# Patient Record
Sex: Female | Born: 1960 | Race: Black or African American | Hispanic: No | Smoking: Never smoker
Health system: Southern US, Community
[De-identification: ages and names within clinical notes are randomized; demographics above are authoritative.]

## PROBLEM LIST (undated history)

## (undated) DIAGNOSIS — I2699 Other pulmonary embolism without acute cor pulmonale: Secondary | ICD-10-CM

## (undated) DIAGNOSIS — Z800 Family history of malignant neoplasm of digestive organs: Secondary | ICD-10-CM

## (undated) DIAGNOSIS — R519 Headache, unspecified: Secondary | ICD-10-CM

## (undated) DIAGNOSIS — I4510 Unspecified right bundle-branch block: Secondary | ICD-10-CM

## (undated) DIAGNOSIS — E669 Obesity, unspecified: Secondary | ICD-10-CM

## (undated) DIAGNOSIS — I200 Unstable angina: Secondary | ICD-10-CM

## (undated) DIAGNOSIS — C50912 Malignant neoplasm of unspecified site of left female breast: Secondary | ICD-10-CM

## (undated) DIAGNOSIS — M360 Dermato(poly)myositis in neoplastic disease: Secondary | ICD-10-CM

## (undated) DIAGNOSIS — Z9221 Personal history of antineoplastic chemotherapy: Secondary | ICD-10-CM

## (undated) DIAGNOSIS — M1990 Unspecified osteoarthritis, unspecified site: Secondary | ICD-10-CM

## (undated) DIAGNOSIS — IMO0001 Reserved for inherently not codable concepts without codable children: Secondary | ICD-10-CM

## (undated) DIAGNOSIS — R51 Headache: Secondary | ICD-10-CM

## (undated) DIAGNOSIS — I341 Nonrheumatic mitral (valve) prolapse: Secondary | ICD-10-CM

## (undated) DIAGNOSIS — Z803 Family history of malignant neoplasm of breast: Secondary | ICD-10-CM

## (undated) DIAGNOSIS — M3390 Dermatopolymyositis, unspecified, organ involvement unspecified: Secondary | ICD-10-CM

## (undated) DIAGNOSIS — R011 Cardiac murmur, unspecified: Secondary | ICD-10-CM

## (undated) DIAGNOSIS — M3313 Other dermatomyositis without myopathy: Secondary | ICD-10-CM

## (undated) DIAGNOSIS — J189 Pneumonia, unspecified organism: Secondary | ICD-10-CM

## (undated) DIAGNOSIS — K760 Fatty (change of) liver, not elsewhere classified: Secondary | ICD-10-CM

## (undated) DIAGNOSIS — I10 Essential (primary) hypertension: Secondary | ICD-10-CM

## (undated) DIAGNOSIS — C55 Malignant neoplasm of uterus, part unspecified: Secondary | ICD-10-CM

## (undated) DIAGNOSIS — Z808 Family history of malignant neoplasm of other organs or systems: Secondary | ICD-10-CM

## (undated) DIAGNOSIS — Z0389 Encounter for observation for other suspected diseases and conditions ruled out: Secondary | ICD-10-CM

## (undated) DIAGNOSIS — Z8489 Family history of other specified conditions: Secondary | ICD-10-CM

## (undated) DIAGNOSIS — D126 Benign neoplasm of colon, unspecified: Secondary | ICD-10-CM

## (undated) HISTORY — PX: LAPAROSCOPIC TOTAL HYSTERECTOMY: SUR800

## (undated) HISTORY — DX: Cardiac murmur, unspecified: R01.1

## (undated) HISTORY — DX: Family history of malignant neoplasm of digestive organs: Z80.0

## (undated) HISTORY — DX: Benign neoplasm of colon, unspecified: D12.6

## (undated) HISTORY — DX: Fatty (change of) liver, not elsewhere classified: K76.0

## (undated) HISTORY — DX: Family history of malignant neoplasm of other organs or systems: Z80.8

## (undated) HISTORY — DX: Reserved for inherently not codable concepts without codable children: IMO0001

## (undated) HISTORY — PX: REDUCTION MAMMAPLASTY: SUR839

## (undated) HISTORY — DX: Dermato(poly)myositis in neoplastic disease: M36.0

## (undated) HISTORY — DX: Unspecified right bundle-branch block: I45.10

## (undated) HISTORY — DX: Encounter for observation for other suspected diseases and conditions ruled out: Z03.89

## (undated) HISTORY — DX: Family history of malignant neoplasm of breast: Z80.3

---

## 1984-05-07 HISTORY — PX: INGUINAL HERNIA REPAIR: SUR1180

## 1988-05-07 HISTORY — PX: TUBAL LIGATION: SHX77

## 1997-05-07 DIAGNOSIS — C50912 Malignant neoplasm of unspecified site of left female breast: Secondary | ICD-10-CM

## 1997-05-07 HISTORY — PX: MASTECTOMY: SHX3

## 1997-05-07 HISTORY — PX: BREAST BIOPSY: SHX20

## 1997-05-07 HISTORY — DX: Malignant neoplasm of unspecified site of left female breast: C50.912

## 2003-05-08 HISTORY — PX: RECONSTRUCTION BREAST IMMEDIATE / DELAYED W/ TISSUE EXPANDER: SUR1077

## 2005-05-07 DIAGNOSIS — C55 Malignant neoplasm of uterus, part unspecified: Secondary | ICD-10-CM

## 2005-05-07 HISTORY — PX: TOTAL ABDOMINAL HYSTERECTOMY: SHX209

## 2005-05-07 HISTORY — DX: Malignant neoplasm of uterus, part unspecified: C55

## 2010-04-06 ENCOUNTER — Emergency Department (HOSPITAL_COMMUNITY)
Admission: EM | Admit: 2010-04-06 | Discharge: 2010-04-06 | Payer: Self-pay | Source: Home / Self Care | Admitting: Emergency Medicine

## 2010-04-06 ENCOUNTER — Emergency Department (HOSPITAL_COMMUNITY)
Admission: EM | Admit: 2010-04-06 | Discharge: 2010-04-06 | Disposition: A | Discharge status: Other Acute Inpatient Hospital | Payer: Self-pay | Source: Home / Self Care | Admitting: Family Medicine

## 2010-05-10 ENCOUNTER — Emergency Department (HOSPITAL_COMMUNITY)
Admission: EM | Admit: 2010-05-10 | Discharge: 2010-05-10 | Payer: Self-pay | Source: Home / Self Care | Admitting: Family Medicine

## 2010-06-01 ENCOUNTER — Ambulatory Visit (HOSPITAL_COMMUNITY)
Admission: RE | Admit: 2010-06-01 | Discharge: 2010-06-01 | Payer: Self-pay | Source: Home / Self Care | Attending: Family Medicine | Admitting: Family Medicine

## 2010-07-18 LAB — DIFFERENTIAL
Lymphocytes Relative: 39 % (ref 12–46)
Monocytes Absolute: 0.5 10*3/uL (ref 0.1–1.0)
Monocytes Relative: 8 % (ref 3–12)
Neutro Abs: 3.4 10*3/uL (ref 1.7–7.7)
Neutrophils Relative %: 51 % (ref 43–77)

## 2010-07-18 LAB — POCT CARDIAC MARKERS
CKMB, poc: <1 ng/mL — ABNORMAL LOW (ref 1.0–8.0)
Myoglobin, poc: 80.6 ng/mL (ref 12–200)
Myoglobin, poc: 81.6 ng/mL (ref 12–200)
Troponin i, poc: <0.05 ng/mL (ref 0.00–0.09)

## 2010-07-18 LAB — CBC
MCH: 29 pg (ref 26.0–34.0)
MCHC: 32.6 g/dL (ref 30.0–36.0)
Platelets: 260 10*3/uL (ref 150–400)
RBC: 4.73 MIL/uL (ref 3.87–5.11)
RDW: 13.3 % (ref 11.5–15.5)

## 2010-07-18 LAB — COMPREHENSIVE METABOLIC PANEL
Albumin: 3.6 g/dL (ref 3.5–5.2)
BUN: 12 mg/dL (ref 6–23)
Calcium: 9.8 mg/dL (ref 8.4–10.5)
Creatinine, Ser: 0.7 mg/dL (ref 0.4–1.2)
Total Protein: 7.9 g/dL (ref 6.0–8.3)

## 2010-07-18 LAB — CK: Total CK: 75 U/L (ref 7–177)

## 2010-11-14 ENCOUNTER — Inpatient Hospital Stay (INDEPENDENT_AMBULATORY_CARE_PROVIDER_SITE_OTHER)
Admission: RE | Admit: 2010-11-14 | Discharge: 2010-11-14 | Disposition: A | Discharge status: Home / Self Care | Payer: Self-pay | Source: Ambulatory Visit | Attending: Emergency Medicine | Admitting: Emergency Medicine

## 2010-11-14 ENCOUNTER — Ambulatory Visit (INDEPENDENT_AMBULATORY_CARE_PROVIDER_SITE_OTHER): Payer: Self-pay

## 2010-11-14 DIAGNOSIS — R05 Cough: Secondary | ICD-10-CM

## 2010-11-14 DIAGNOSIS — R059 Cough, unspecified: Secondary | ICD-10-CM

## 2010-11-14 DIAGNOSIS — J40 Bronchitis, not specified as acute or chronic: Secondary | ICD-10-CM

## 2011-04-26 ENCOUNTER — Emergency Department (HOSPITAL_COMMUNITY)
Admission: EM | Admit: 2011-04-26 | Discharge: 2011-04-26 | Disposition: A | Discharge status: Home / Self Care | Payer: Self-pay | Source: Home / Self Care | Attending: Family Medicine | Admitting: Family Medicine

## 2011-04-26 DIAGNOSIS — J019 Acute sinusitis, unspecified: Secondary | ICD-10-CM

## 2011-04-26 MED ORDER — AMOXICILLIN-POT CLAVULANATE 875-125 MG PO TABS: 1.0000 | ORAL_TABLET | Freq: Two times a day (BID) | ORAL | Status: AC

## 2011-04-26 MED ORDER — HYDROCODONE-ACETAMINOPHEN 5-325 MG PO TABS: 2.0000 | ORAL_TABLET | ORAL | Status: AC | PRN

## 2011-04-26 NOTE — ED Notes (Signed)
PT HERE WITH SINUS INFECTION/URI SX THAT STARTED X 2 DYS AGO WITH RIGHT EYE H/A,MIN COUGH AND NASAL CONGESTION BUT HAS WORSENED WITH SEVERE THROB PRESSURE IN HEAD AND FACIAL PAIN

## 2011-04-26 NOTE — ED Provider Notes (Signed)
History     CSN: [id number]  Arrival date & time 04/26/11  1523   First MD Initiated Contact with Patient 04/26/11 1616      Chief Complaint  Patient presents with  . Sinusitis  . URI    (Consider location/radiation/quality/duration/timing/severity/associated sxs/prior treatment) Patient is a 50 y.o. female presenting with sinusitis and URI. The history is provided by the patient. No language interpreter was used.  Sinusitis  This is a new problem. The current episode started more than 2 days ago. The problem has been gradually worsening. The maximum temperature recorded prior to her arrival was 100 to 100.9 F. The fever has been present for less than 1 day. The pain is at a severity of 7/10. The pain is moderate. The pain has been constant since onset. Associated symptoms include sinus pressure, sore throat and cough. She has tried nothing for the symptoms. The treatment provided no relief.  URI The primary symptoms include sore throat and cough.  Symptoms associated with the illness include sinus pressure.  Pt complains of green sinus drainage.  No past medical history on file.  No past surgical history on file.  No family history on file.  History  Substance Use Topics  . Smoking status: Not on file  . Smokeless tobacco: Not on file  . Alcohol Use: Not on file    OB History    No data available      Review of Systems  HENT: Positive for sore throat and sinus pressure.   Respiratory: Positive for cough.   All other systems reviewed and are negative.    Allergies  Review of patient's allergies indicates no known allergies.  Home Medications   Current Outpatient Rx  Name Route Sig Dispense Refill  . LISINOPRIL 10 MG PO TABS Oral Take 10 mg by mouth daily.      Marland Kitchen PREDNISONE 20 MG PO TABS Oral Take 20 mg by mouth daily.      Marland Kitchen VERAPAMIL HCL 120 MG PO TABS Oral Take by mouth 2 (two) times daily.        BP 127/84  Pulse 72  Temp(Src) 97.8 F (36.6 C)  (Oral)  Resp 18  SpO2 100%  Physical Exam  Nursing note and vitals reviewed. Constitutional: She is oriented to person, place, and time. She appears well-developed and well-nourished.  HENT:  Head: Normocephalic and atraumatic.  Right Ear: External ear normal.  Left Ear: External ear normal.  Nose: Nose normal.  Mouth/Throat: Oropharynx is clear and moist.       bilat maxillary sinus tenderness  Eyes: Conjunctivae and EOM are normal. Pupils are equal, round, and reactive to light.  Neck: Normal range of motion. Neck supple.  Cardiovascular: Normal rate.   Pulmonary/Chest: Effort normal.  Abdominal: Soft.  Neurological: She is alert and oriented to person, place, and time.  Skin: Skin is warm and dry.  Psychiatric: She has a normal mood and affect.    ED Course  Procedures (including critical care time)  Labs Reviewed - No data to display No results found.   No diagnosis found.    MDM          Langston Masker, PA 04/26/11 [phone]

## 2011-04-26 NOTE — ED Provider Notes (Signed)
Medical screening examination/treatment/procedure(s) were performed by non-physician practitioner and as supervising physician I was immediately available for consultation/collaboration.   Abcde Oneil DOUGLAS MD.    Arley Salamone Douglas Deneene Tarver, MD 04/26/11 2011 

## 2011-07-12 ENCOUNTER — Encounter (HOSPITAL_COMMUNITY): Payer: Self-pay

## 2011-07-12 ENCOUNTER — Other Ambulatory Visit: Payer: Self-pay

## 2011-07-12 ENCOUNTER — Observation Stay (HOSPITAL_COMMUNITY)
Admission: EM | Admit: 2011-07-12 | Discharge: 2011-07-13 | Disposition: A | Discharge status: Home / Self Care | Payer: 59 | Attending: Family Medicine | Admitting: Family Medicine

## 2011-07-12 ENCOUNTER — Emergency Department (HOSPITAL_COMMUNITY): Payer: Self-pay

## 2011-07-12 DIAGNOSIS — R0789 Other chest pain: Secondary | ICD-10-CM | POA: Insufficient documentation

## 2011-07-12 DIAGNOSIS — R079 Chest pain, unspecified: Principal | ICD-10-CM | POA: Insufficient documentation

## 2011-07-12 DIAGNOSIS — I10 Essential (primary) hypertension: Secondary | ICD-10-CM | POA: Diagnosis present

## 2011-07-12 DIAGNOSIS — E669 Obesity, unspecified: Secondary | ICD-10-CM | POA: Insufficient documentation

## 2011-07-12 DIAGNOSIS — I200 Unstable angina: Secondary | ICD-10-CM

## 2011-07-12 DIAGNOSIS — Z853 Personal history of malignant neoplasm of breast: Secondary | ICD-10-CM | POA: Insufficient documentation

## 2011-07-12 DIAGNOSIS — M79609 Pain in unspecified limb: Secondary | ICD-10-CM | POA: Insufficient documentation

## 2011-07-12 HISTORY — DX: Obesity, unspecified: E66.9

## 2011-07-12 HISTORY — DX: Essential (primary) hypertension: I10

## 2011-07-12 HISTORY — DX: Unstable angina: I20.0

## 2011-07-12 HISTORY — DX: Dermatopolymyositis, unspecified, organ involvement unspecified: M33.90

## 2011-07-12 HISTORY — DX: Malignant neoplasm of unspecified site of left female breast: C50.912

## 2011-07-12 HISTORY — DX: Other dermatomyositis without myopathy: M33.13

## 2011-07-12 LAB — DIFFERENTIAL
Eosinophils Absolute: 0.1 10*3/uL (ref 0.0–0.7)
Eosinophils Relative: 1 % (ref 0–5)
Lymphocytes Relative: 26 % (ref 12–46)
Lymphs Abs: 1.5 10*3/uL (ref 0.7–4.0)
Monocytes Absolute: 0.4 10*3/uL (ref 0.1–1.0)

## 2011-07-12 LAB — CBC
HCT: 39.4 % (ref 36.0–46.0)
MCH: 28 pg (ref 26.0–34.0)
MCV: 89.5 fL (ref 78.0–100.0)
Platelets: 234 10*3/uL (ref 150–400)
RBC: 4.4 MIL/uL (ref 3.87–5.11)
RDW: 13.8 % (ref 11.5–15.5)

## 2011-07-12 LAB — BASIC METABOLIC PANEL
CO2: 27 mEq/L (ref 19–32)
Calcium: 9.5 mg/dL (ref 8.4–10.5)
Creatinine, Ser: 0.84 mg/dL (ref 0.50–1.10)
GFR calc non Af Amer: 79 mL/min — ABNORMAL LOW (ref >90)
Glucose, Bld: 117 mg/dL — ABNORMAL HIGH (ref 70–99)
Sodium: 135 mEq/L (ref 135–145)

## 2011-07-12 LAB — CARDIAC PANEL(CRET KIN+CKTOT+MB+TROPI): CK, MB: 1.6 ng/mL (ref 0.3–4.0)

## 2011-07-12 MED ORDER — ONDANSETRON HCL 4 MG/2ML IJ SOLN: 4.0000 mg | Freq: Four times a day (QID) | INTRAMUSCULAR | Status: DC | PRN

## 2011-07-12 MED ORDER — ASPIRIN 81 MG PO CHEW
324.0000 mg | CHEWABLE_TABLET | Freq: Once | ORAL | Status: AC
  Administered 2011-07-12: 324 mg via ORAL

## 2011-07-12 MED ORDER — LISINOPRIL 20 MG PO TABS
20.0000 mg | ORAL_TABLET | Freq: Every day | ORAL | Status: DC
  Administered 2011-07-13: 20 mg via ORAL
  Filled 2011-07-12: qty 1

## 2011-07-12 MED ORDER — ACETAMINOPHEN 650 MG RE SUPP: 650.0000 mg | Freq: Four times a day (QID) | RECTAL | Status: DC | PRN

## 2011-07-12 MED ORDER — SODIUM CHLORIDE 0.9 % IV SOLN
INTRAVENOUS | Status: DC
  Administered 2011-07-12: 1000 mL via INTRAVENOUS

## 2011-07-12 MED ORDER — PREDNISOLONE 5 MG PO TABS
5.0000 mg | ORAL_TABLET | Freq: Every day | ORAL | Status: DC
  Administered 2011-07-13: 5 mg via ORAL
  Filled 2011-07-12 (×2): qty 1

## 2011-07-12 MED ORDER — PANTOPRAZOLE SODIUM 40 MG PO TBEC
40.0000 mg | DELAYED_RELEASE_TABLET | Freq: Every day | ORAL | Status: DC
  Administered 2011-07-13 (×2): 40 mg via ORAL
  Filled 2011-07-12 (×2): qty 1

## 2011-07-12 MED ORDER — ACETAMINOPHEN 325 MG PO TABS
650.0000 mg | ORAL_TABLET | Freq: Once | ORAL | Status: AC
  Administered 2011-07-12: 650 mg via ORAL
  Filled 2011-07-12: qty 1

## 2011-07-12 MED ORDER — HYDROCHLOROTHIAZIDE 25 MG PO TABS
25.0000 mg | ORAL_TABLET | Freq: Every day | ORAL | Status: DC
  Administered 2011-07-13: 25 mg via ORAL
  Filled 2011-07-12: qty 1

## 2011-07-12 MED ORDER — ASPIRIN EC 325 MG PO TBEC
325.0000 mg | DELAYED_RELEASE_TABLET | Freq: Every day | ORAL | Status: DC
  Administered 2011-07-13: 325 mg via ORAL
  Filled 2011-07-12: qty 1

## 2011-07-12 MED ORDER — NITROGLYCERIN 0.4 MG SL SUBL
SUBLINGUAL_TABLET | SUBLINGUAL | Status: AC
  Administered 2011-07-13: 0.4 mg via SUBLINGUAL
  Filled 2011-07-12: qty 25

## 2011-07-12 MED ORDER — SODIUM CHLORIDE 0.9 % IJ SOLN
3.0000 mL | Freq: Two times a day (BID) | INTRAMUSCULAR | Status: DC
  Administered 2011-07-13: 3 mL via INTRAVENOUS

## 2011-07-12 MED ORDER — VERAPAMIL HCL 120 MG PO TABS
120.0000 mg | ORAL_TABLET | Freq: Two times a day (BID) | ORAL | Status: DC
  Administered 2011-07-13 (×2): 120 mg via ORAL
  Filled 2011-07-12 (×4): qty 1

## 2011-07-12 MED ORDER — LISINOPRIL-HYDROCHLOROTHIAZIDE 20-25 MG PO TABS: 1.0000 | ORAL_TABLET | Freq: Every morning | ORAL | Status: DC

## 2011-07-12 MED ORDER — ONDANSETRON HCL 4 MG PO TABS: 4.0000 mg | ORAL_TABLET | Freq: Four times a day (QID) | ORAL | Status: DC | PRN

## 2011-07-12 MED ORDER — NITROGLYCERIN 0.4 MG/HR TD PT24
0.4000 mg | MEDICATED_PATCH | Freq: Every day | TRANSDERMAL | Status: DC
  Administered 2011-07-13: 0.4 mg via TRANSDERMAL
  Filled 2011-07-12: qty 1

## 2011-07-12 MED ORDER — NITROGLYCERIN 0.4 MG SL SUBL
0.4000 mg | SUBLINGUAL_TABLET | SUBLINGUAL | Status: AC | PRN
  Administered 2011-07-12 (×3): 0.4 mg via SUBLINGUAL

## 2011-07-12 MED ORDER — ACETAMINOPHEN 325 MG PO TABS: 650.0000 mg | ORAL_TABLET | Freq: Four times a day (QID) | ORAL | Status: DC | PRN

## 2011-07-12 MED ORDER — ENOXAPARIN SODIUM 40 MG/0.4ML ~~LOC~~ SOLN
40.0000 mg | SUBCUTANEOUS | Status: DC
  Administered 2011-07-13: 40 mg via SUBCUTANEOUS
  Filled 2011-07-12 (×2): qty 0.4

## 2011-07-12 NOTE — ED Notes (Signed)
Pt c/o chest pain again. 6/10

## 2011-07-12 NOTE — ED Notes (Signed)
Pt has bed ready. Awaiting orders to admit pt

## 2011-07-12 NOTE — ED Notes (Signed)
Pt states that she was in the shower today around 1pm and states that she had onset of left sided chest pain that had some radiation down her left arm. She denies any other s/s. Pt is alert and oriented. She states that she has an appointment on 07-20-11 with her pcp at [facility]. She states that she has been having issues with regulating her blood pressure. Pt states that initially she felt like it was heartburn that she just couldn't get to improve.

## 2011-07-12 NOTE — ED Notes (Signed)
2009-01 Ready 

## 2011-07-12 NOTE — H&P (Signed)
Gail Schmitt is an 51 y.o. female.   PCP - [facility]. Chief Complaint: Chest pain. HPI: 51 year-old female with history of hypertension, dermatomyositis, history of breast cancer status post mastectomy and chemotherapy in 1998 presented to the ER because of chest pain. Patient has been experiencing chest pain off and on for last 2 weeks with some exertional shortness of breath. The chest pain is usually left-sided anterior chest wall. It lasts a few minutes usually. But today the pain was more persistent radiating to her left arm. The chest pain as like pressure like with no associated diaphoresis nausea or palpitations or dizziness. She decided to call EMS and was brought to the ER. Her chest pain got better after she got nitroglycerin sublingual. Presently patient is chest pain-free. Patient denies any headache focal deficits abdominal pain or diarrhea.  Past Medical History  Diagnosis Date  . Hypertension   . Cancer     Past Surgical History  Procedure Date  . Laparoscopic total hysterectomy   . Mastectomy     left side, 10 years ago  . Breast surgery     Family History  Problem Relation Age of Onset  . Diabetes type II Mother    Social History:  reports that she has never smoked. She does not have any smokeless tobacco history on file. She reports that she does not drink alcohol or use illicit drugs.  Allergies:  Allergies  Allergen Reactions  . Percocet (Oxycodone-Acetaminophen) Itching and Rash    Medications Prior to Admission  Medication Dose Route Frequency Provider Last Rate Last Dose  . acetaminophen (TYLENOL) tablet 650 mg  650 mg Oral Once Felisa Bonier, MD   650 mg at 07/12/11 1939  . aspirin chewable tablet 324 mg  324 mg Oral Once Felisa Bonier, MD   324 mg at 07/12/11 1939  . nitroGLYCERIN (NITROSTAT) SL tablet 0.4 mg  0.4 mg Sublingual Q5 min PRN Felisa Bonier, MD   0.4 mg at 07/12/11 2136   Medications Prior to Admission  Medication Sig Dispense  Refill  . verapamil (CALAN) 120 MG tablet Take by mouth 2 (two) times daily.          Results for orders placed during the hospital encounter of 07/12/11 (from the past 48 hour(s))  CBC     Status: Normal   Collection Time   07/12/11  7:54 PM      Component Value Range Comment   WBC 5.8  4.0 - 10.5 (K/uL)    RBC 4.40  3.87 - 5.11 (MIL/uL)    Hemoglobin 12.3  12.0 - 15.0 (g/dL)    HCT 16.1  09.6 - 04.5 (%)    MCV 89.5  78.0 - 100.0 (fL)    MCH 28.0  26.0 - 34.0 (pg)    MCHC 31.2  30.0 - 36.0 (g/dL)    RDW 40.9  81.1 - 91.4 (%)    Platelets 234  150 - 400 (K/uL)   DIFFERENTIAL     Status: Normal   Collection Time   07/12/11  7:54 PM      Component Value Range Comment   Neutrophils Relative 66  43 - 77 (%)    Neutro Abs 3.9  1.7 - 7.7 (K/uL)    Lymphocytes Relative 26  12 - 46 (%)    Lymphs Abs 1.5  0.7 - 4.0 (K/uL)    Monocytes Relative 7  3 - 12 (%)    Monocytes Absolute 0.4  0.1 - 1.0 (K/uL)    Eosinophils Relative 1  0 - 5 (%)    Eosinophils Absolute 0.1  0.0 - 0.7 (K/uL)    Basophils Relative 1  0 - 1 (%)    Basophils Absolute 0.0  0.0 - 0.1 (K/uL)   BASIC METABOLIC PANEL     Status: Abnormal   Collection Time   07/12/11  7:54 PM      Component Value Range Comment   Sodium 135  135 - 145 (mEq/L)    Potassium 4.1  3.5 - 5.1 (mEq/L)    Chloride 98  96 - 112 (mEq/L)    CO2 27  19 - 32 (mEq/L)    Glucose, Bld 117 (*) 70 - 99 (mg/dL)    BUN 17  6 - 23 (mg/dL)    Creatinine, Ser 1.61  0.50 - 1.10 (mg/dL)    Calcium 9.5  8.4 - 10.5 (mg/dL)    GFR calc non Af Amer 79 (*) >90 (mL/min)    GFR calc Af Amer >90  >90 (mL/min)   CARDIAC PANEL(CRET KIN+CKTOT+MB+TROPI)     Status: Normal   Collection Time   07/12/11  7:55 PM      Component Value Range Comment   Total CK 108  7 - 177 (U/L)    CK, MB 1.6  0.3 - 4.0 (ng/mL)    Troponin I <0.30  <0.30 (ng/mL)    Relative Index 1.5  0.0 - 2.5     Dg Chest 2 View  07/12/2011  *RADIOLOGY REPORT*  Clinical Data: Chest pain today.   Hypertension.  History of breast cancer.  CHEST - 2 VIEW  Comparison: 11/14/2010 radiographs.  Findings: The heart size and mediastinal contours are stable.  The lungs are clear.  There is no pleural effusion or pneumothorax. There are postsurgical changes status post left mastectomy and breast implantation.  No acute osseous findings are seen.  IMPRESSION: Stable postoperative chest.  No active cardiopulmonary process.  Original Report Authenticated By: Gerrianne Scale, M.D.    Review of Systems  Constitutional: Negative.   HENT: Negative.   Eyes: Negative.   Respiratory: Positive for shortness of breath.   Cardiovascular: Positive for chest pain.  Gastrointestinal: Negative.   Genitourinary: Negative.   Musculoskeletal: Negative.   Skin: Negative.   Neurological: Negative.   Endo/Heme/Allergies: Negative.   Psychiatric/Behavioral: Negative.     Blood pressure 127/71, pulse 74, temperature 97.4 F (36.3 C), temperature source Oral, resp. rate 17, SpO2 99.00%. Physical Exam  Constitutional: She is oriented to person, place, and time. She appears well-developed and well-nourished. No distress.  HENT:  Head: Normocephalic and atraumatic.  Right Ear: External ear normal.  Left Ear: External ear normal.  Nose: Nose normal.  Mouth/Throat: Oropharynx is clear and moist. No oropharyngeal exudate.  Eyes: Conjunctivae are normal. Pupils are equal, round, and reactive to light. Right eye exhibits no discharge. Left eye exhibits no discharge. No scleral icterus.  Neck: Normal range of motion. Neck supple.  Cardiovascular: Normal rate and regular rhythm.   Respiratory: Effort normal and breath sounds normal. No respiratory distress. She has no wheezes.  GI: Soft. Bowel sounds are normal. She exhibits no distension. There is no tenderness. There is no rebound.  Musculoskeletal: Normal range of motion. She exhibits no edema and no tenderness.  Neurological: She is alert and oriented to person,  place, and time.       Moves all extremities.  Skin: Skin is warm and dry. No  rash noted. She is not diaphoretic. No erythema.  Psychiatric: Her behavior is normal.     Assessment/Plan #1. Chest pain to rule out ACS - patient does have some exertional symptoms concerning for cardiac etiology. Patient will be placed on aspirin and as needed nitroglycerin. Cycle cardiac markers. I will place patient on n.p.o. past midnight in anticipation of procedure. Will obtain cardiology consult in a.m. #2. History of hypertension - continue present medications. #3. History of dermatomyositis on steroids. #4. History of breast cancer status post left mastectomy and chemotherapy in 1998.  CODE STATUS - full code.  Eduard Clos. 07/12/2011, 10:02 PM

## 2011-07-12 NOTE — ED Notes (Signed)
Pt states she has had chest pain since 1pm today. Pt states she has not taken her medicine today and believes that her bp has been very unstable lately. Pt states she does feel like it radiates to the underside of her left arm. Pt denies n/v, diaphoresis, dizziness. Pt states that she does feel more "winded" today when she ambulates. NAD noted at this time

## 2011-07-12 NOTE — ED Provider Notes (Signed)
History     CSN: [id number]  Arrival date & time 07/12/11  1851   First MD Initiated Contact with Patient 07/12/11 1909      Chief Complaint  Patient presents with  . Chest Pain    (Consider location/radiation/quality/duration/timing/severity/associated sxs/prior treatment) Patient is a 51 y.o. female presenting with chest pain. The history is provided by the patient.  Chest Pain The chest pain began 6 - 12 hours ago. Duration of episode(s) is 6 hours. Chest pain occurs constantly. The chest pain is improving. Associated with: No associated factors. At its most intense, the pain is at 10/10. The pain is currently at 8/10. The severity of the pain is severe. The quality of the pain is described as dull, heavy and pressure-like. The pain radiates to the left shoulder. Exacerbated by: Nothing. Primary symptoms include shortness of breath. Pertinent negatives for primary symptoms include no fever, no fatigue, no syncope, no cough, no wheezing, no palpitations, no abdominal pain, no nausea, no vomiting, no dizziness and no altered mental status.  Pertinent negatives for associated symptoms include no claudication, no diaphoresis, no lower extremity edema, no near-syncope, no numbness, no orthopnea, no paroxysmal nocturnal dyspnea and no weakness. She tried nothing for the symptoms. Risk factors: Hypertension.  Her past medical history is significant for hypertension.  Pertinent negatives for past medical history include no arrhythmia, no CAD, no COPD and no CHF. Past medical history comments: Dermatomyositis  Pertinent negatives for family medical history include: no CAD in family.     Past Medical History  Diagnosis Date  . Hypertension     Past Surgical History  Procedure Date  . Laparoscopic total hysterectomy   . Mastectomy     left side, 10 years ago    History reviewed. No pertinent family history.  History  Substance Use Topics  . Smoking status: Never Smoker   . Smokeless  tobacco: Not on file  . Alcohol Use: No    OB History    Grav Para Term Preterm Abortions TAB SAB Ect Mult Living                  Review of Systems  Constitutional: Negative for fever, chills, diaphoresis, activity change, appetite change and fatigue.  HENT: Negative for congestion, facial swelling, rhinorrhea, neck pain, neck stiffness and postnasal drip.   Eyes: Negative.   Respiratory: Positive for shortness of breath. Negative for cough, choking, chest tightness and wheezing.        Dyspnea on exertion  Cardiovascular: Positive for chest pain. Negative for palpitations, orthopnea, claudication, leg swelling, syncope and near-syncope.  Gastrointestinal: Negative for nausea, vomiting, abdominal pain and abdominal distention.  Musculoskeletal: Negative.   Skin: Negative.   Neurological: Negative for dizziness, syncope, speech difficulty, weakness, light-headedness, numbness and headaches.  Hematological: Does not bruise/bleed easily.  Psychiatric/Behavioral: Negative.  Negative for altered mental status.    Allergies  Percocet  Home Medications   Current Outpatient Rx  Name Route Sig Dispense Refill  . CALCIUM CARBONATE 1250 MG PO TABS Oral Take 1 tablet by mouth daily.    Marland Kitchen LISINOPRIL-HYDROCHLOROTHIAZIDE 20-25 MG PO TABS Oral Take 1 tablet by mouth every morning.    Marland Kitchen PREDNISOLONE 5 MG PO TABS Oral Take 5 mg by mouth daily.    Marland Kitchen VERAPAMIL HCL 120 MG PO TABS Oral Take by mouth 2 (two) times daily.        BP 149/81  Pulse 72  Temp(Src) 97.4 F (36.3 C) (Oral)  Resp  13  SpO2 99%  Physical Exam  Nursing note and vitals reviewed. Constitutional: She is oriented to person, place, and time. She appears well-developed and well-nourished. No distress.  HENT:  Head: Normocephalic and atraumatic.  Mouth/Throat: Oropharynx is clear and moist.  Eyes: EOM are normal. Pupils are equal, round, and reactive to light.  Neck: Normal range of motion. Neck supple. No JVD present. No  tracheal deviation present.  Cardiovascular: Normal rate, regular rhythm, normal heart sounds and intact distal pulses.   No extrasystoles are present. Exam reveals no gallop, no S3, no S4 and no friction rub.   No murmur heard. Pulmonary/Chest: Breath sounds normal. No accessory muscle usage or stridor. Not tachypneic. No respiratory distress. She has no wheezes. She has no rales. She exhibits no tenderness.  Abdominal: Soft. Bowel sounds are normal. She exhibits no distension. There is no tenderness.  Musculoskeletal: Normal range of motion. She exhibits no edema and no tenderness.  Neurological: She is alert and oriented to person, place, and time. She has normal reflexes. No cranial nerve deficit. Coordination normal.  Skin: Skin is warm and dry. No rash noted. She is not diaphoretic. No erythema. No pallor.  Psychiatric: She has a normal mood and affect. Her behavior is normal. Judgment and thought content normal.    ED Course  Procedures (including critical care time)  8:54 PM  Date: 07/12/2011  Rate: 79  Rhythm: normal sinus rhythm  QRS Axis: normal  Intervals: normal  ST/T Wave abnormalities: nonspecific ST/T changes  Conduction Disutrbances:none  Narrative Interpretation: Non-provocative EKG  Old EKG Reviewed: unchanged    Labs Reviewed  BASIC METABOLIC PANEL - Abnormal; Notable for the following:    Glucose, Bld 117 (*)    GFR calc non Af Amer 79 (*)    All other components within normal limits  CBC  DIFFERENTIAL  CARDIAC PANEL(CRET KIN+CKTOT+MB+TROPI)   Dg Chest 2 View  07/12/2011  *RADIOLOGY REPORT*  Clinical Data: Chest pain today.  Hypertension.  History of breast cancer.  CHEST - 2 VIEW  Comparison: 11/14/2010 radiographs.  Findings: The heart size and mediastinal contours are stable.  The lungs are clear.  There is no pleural effusion or pneumothorax. There are postsurgical changes status post left mastectomy and breast implantation.  No acute osseous findings are  seen.  IMPRESSION: Stable postoperative chest.  No active cardiopulmonary process.  Original Report Authenticated By: Gerrianne Scale, M.D.     No diagnosis found.    MDM  MI, CAD, ACS, Musculoskeletal chest pain, costochondritis, GERD, Gastrointestinal Chest Pain, Pleuritic Chest Pain, Pneumonia, Pneumothorax, Pulmonary Embolism, Esophageal Spasm, Arrhythmia considered among other potential etiologies in the patient's differential diagnosis.  8:55 PM The patient's chest pain has been completely relieved after sublingual nitroglycerin. She is having no further shortness of breath or chest discomfort. Her vital signs are stable and she is in no apparent distress. Due to the patient's concerning nature of the character of symptoms which are typical for cardiac chest pain, and despite her negative the ECG and her lack of risk factors, I do feel that the patient needs a cardiac rule out and admission for her chest pain.        Felisa Bonier, MD 07/12/11 2056

## 2011-07-13 ENCOUNTER — Other Ambulatory Visit: Payer: Self-pay

## 2011-07-13 ENCOUNTER — Encounter (HOSPITAL_COMMUNITY)
Admission: EM | Disposition: A | Discharge status: Home / Self Care | Payer: Self-pay | Source: Home / Self Care | Attending: Internal Medicine

## 2011-07-13 ENCOUNTER — Encounter (HOSPITAL_COMMUNITY): Payer: Self-pay | Admitting: Nurse Practitioner

## 2011-07-13 DIAGNOSIS — I200 Unstable angina: Secondary | ICD-10-CM

## 2011-07-13 DIAGNOSIS — R0789 Other chest pain: Secondary | ICD-10-CM | POA: Insufficient documentation

## 2011-07-13 DIAGNOSIS — E669 Obesity, unspecified: Secondary | ICD-10-CM | POA: Insufficient documentation

## 2011-07-13 DIAGNOSIS — R079 Chest pain, unspecified: Secondary | ICD-10-CM

## 2011-07-13 DIAGNOSIS — R072 Precordial pain: Secondary | ICD-10-CM

## 2011-07-13 HISTORY — PX: CARDIAC CATHETERIZATION: SHX172

## 2011-07-13 HISTORY — PX: LEFT HEART CATHETERIZATION WITH CORONARY ANGIOGRAM: SHX5451

## 2011-07-13 LAB — CBC
Hemoglobin: 11.7 g/dL — ABNORMAL LOW (ref 12.0–15.0)
MCH: 28.1 pg (ref 26.0–34.0)
MCHC: 31.5 g/dL (ref 30.0–36.0)
MCV: 88 fL (ref 78.0–100.0)
Platelets: 256 10*3/uL (ref 150–400)
RBC: 4.57 MIL/uL (ref 3.87–5.11)
WBC: 6.3 10*3/uL (ref 4.0–10.5)

## 2011-07-13 LAB — COMPREHENSIVE METABOLIC PANEL
ALT: 10 U/L (ref 0–35)
AST: 14 U/L (ref 0–37)
CO2: 30 mEq/L (ref 19–32)
Calcium: 9.5 mg/dL (ref 8.4–10.5)
Chloride: 103 mEq/L (ref 96–112)
GFR calc non Af Amer: >90 mL/min (ref >90)
Sodium: 141 mEq/L (ref 135–145)
Total Bilirubin: 0.4 mg/dL (ref 0.3–1.2)

## 2011-07-13 LAB — TSH: TSH: 0.93 u[IU]/mL (ref 0.350–4.500)

## 2011-07-13 LAB — PROTIME-INR
INR: 1.15 (ref 0.00–1.49)
Prothrombin Time: 14.9 seconds (ref 11.6–15.2)

## 2011-07-13 LAB — CARDIAC PANEL(CRET KIN+CKTOT+MB+TROPI)
Relative Index: INVALID (ref 0.0–2.5)
Total CK: 107 U/L (ref 7–177)

## 2011-07-13 LAB — CREATININE, SERUM
Creatinine, Ser: 0.73 mg/dL (ref 0.50–1.10)
GFR calc Af Amer: >90 mL/min (ref >90)
GFR calc non Af Amer: >90 mL/min (ref >90)

## 2011-07-13 LAB — LIPID PANEL
LDL Cholesterol: 99 mg/dL (ref 0–99)
Total CHOL/HDL Ratio: 3.4 RATIO

## 2011-07-13 SURGERY — LEFT HEART CATHETERIZATION WITH CORONARY ANGIOGRAM
Anesthesia: LOCAL

## 2011-07-13 MED ORDER — HEPARIN (PORCINE) IN NACL 2-0.9 UNIT/ML-% IJ SOLN
INTRAMUSCULAR | Status: AC
  Filled 2011-07-13: qty 2000

## 2011-07-13 MED ORDER — SODIUM CHLORIDE 0.9 % IV SOLN
250.0000 mL | INTRAVENOUS | Status: DC
  Administered 2011-07-13: 250 mL via INTRAVENOUS

## 2011-07-13 MED ORDER — ATORVASTATIN CALCIUM 80 MG PO TABS
80.0000 mg | ORAL_TABLET | Freq: Every day | ORAL | Status: DC
  Filled 2011-07-13: qty 1

## 2011-07-13 MED ORDER — SODIUM CHLORIDE 0.9 % IJ SOLN: 3.0000 mL | Freq: Two times a day (BID) | INTRAMUSCULAR | Status: DC

## 2011-07-13 MED ORDER — PREDNISONE 5 MG PO TABS
5.0000 mg | ORAL_TABLET | Freq: Every day | ORAL | Status: DC
  Filled 2011-07-13: qty 1

## 2011-07-13 MED ORDER — NITROGLYCERIN 0.4 MG SL SUBL: 0.4000 mg | SUBLINGUAL_TABLET | SUBLINGUAL | Status: DC | PRN

## 2011-07-13 MED ORDER — MIDAZOLAM HCL 2 MG/2ML IJ SOLN
INTRAMUSCULAR | Status: AC
  Filled 2011-07-13: qty 2

## 2011-07-13 MED ORDER — FENTANYL CITRATE 0.05 MG/ML IJ SOLN
INTRAMUSCULAR | Status: AC
Start: 2011-07-13 — End: 2011-07-13
  Filled 2011-07-13: qty 2

## 2011-07-13 MED ORDER — NITROGLYCERIN 0.2 MG/ML ON CALL CATH LAB
INTRAVENOUS | Status: AC
  Filled 2011-07-13: qty 1

## 2011-07-13 MED ORDER — HEPARIN SODIUM (PORCINE) 1000 UNIT/ML IJ SOLN
INTRAMUSCULAR | Status: AC
  Filled 2011-07-13: qty 1

## 2011-07-13 MED ORDER — LIDOCAINE HCL (PF) 1 % IJ SOLN
INTRAMUSCULAR | Status: AC
  Filled 2011-07-13: qty 30

## 2011-07-13 MED ORDER — SODIUM CHLORIDE 0.9 % IJ SOLN: 3.0000 mL | INTRAMUSCULAR | Status: DC | PRN

## 2011-07-13 MED ORDER — ASPIRIN EC 325 MG PO TBEC
325.0000 mg | DELAYED_RELEASE_TABLET | Freq: Every day | ORAL | Status: DC
  Administered 2011-07-13: 325 mg via ORAL
  Filled 2011-07-13: qty 1

## 2011-07-13 MED ORDER — SODIUM CHLORIDE 0.9 % IV SOLN
1.0000 mL/kg/h | INTRAVENOUS | Status: DC
  Administered 2011-07-13: 1 mL/kg/h via INTRAVENOUS

## 2011-07-13 MED ORDER — VERAPAMIL HCL 2.5 MG/ML IV SOLN
INTRAVENOUS | Status: AC
  Filled 2011-07-13: qty 2

## 2011-07-13 MED ORDER — DIAZEPAM 5 MG PO TABS: 5.0000 mg | ORAL_TABLET | ORAL | Status: DC | PRN

## 2011-07-13 MED ORDER — ASPIRIN 81 MG PO CHEW
CHEWABLE_TABLET | ORAL | Status: AC
  Filled 2011-07-13: qty 4

## 2011-07-13 MED ORDER — SODIUM CHLORIDE 0.9 % IV SOLN: 1.0000 mL/kg/h | INTRAVENOUS | Status: DC

## 2011-07-13 MED ORDER — ONDANSETRON HCL 4 MG/2ML IJ SOLN: 4.0000 mg | Freq: Four times a day (QID) | INTRAMUSCULAR | Status: DC | PRN

## 2011-07-13 NOTE — Consult Note (Signed)
CARDIOLOGY CONSULT NOTE  Patient ID: Gail Schmitt MRN: [id number], DOB/AGE: 53-02-1961   Admit date: 07/12/2011 Date of Consult: 07/13/2011   Primary Physician: Lupita Raider, MD, MD Primary Cardiologist: New - M. Excell Seltzer, MD  Pt. Profile  51 y/o female w/o prior cardiac history who presents with chest pain.  Problem List  Past Medical History  Diagnosis Date  . Hypertension   . Breast cancer, left breast     a. s/p left mastectomy, 1999  . Unstable angina     a. Cath ~ 10 + yrs ago, [facility], [place] - "normal but pressures were high."  . Obesity   . Dermatomyositis     a. noted prior to diagnosis of Breast CA ("i'm allergic to cancer")    Past Surgical History  Procedure Date  . Laparoscopic total hysterectomy   . Mastectomy     left side, 10 years ago  . Breast surgery      Allergies  Allergies  Allergen Reactions  . Percocet (Oxycodone-Acetaminophen) Itching and Rash    HPI   51 year old female with prior history of chest pain in the setting of hypertension status post cardiac catheterization in [place] approximately 7-8 years ago. At that time she was told catheterization was normal blood pressures were elevated. Patient's blood pressure has been managed by [facility] and she notes that approximately 3 weeks ago or so she was running out of her verapamil. She called [facility] for a refill and they scheduled her an appointment for last Thursday. In the interim, she ran out of her verapamil and was without for about 10 days. During that period of time, she began to experience dyspnea on exertion and exertional midsternal chest pressure lasting approximately 10-15 minutes and resolving with rest. Symptoms would occur after walking only 50 yards. When she was seen by health service she was markedly hypertensive with diastolics greater than 100 per her report. She was placed back on her medications and since that time, she has continued to have exertional chest  pressure and dyspnea.  Yesterday afternoon, patient was showering and had sudden onset of substernal chest discomfort with radiation to the left arm associated with dyspnea. Symptoms were more severe than prior episodes. Patient rested after her shower and after an hour or 2 symptoms eased off some but not completely. After dinner, incursion of a friend, patient presented to [facility] ED. There, her ECG was not acute enter first of cardiac markers were negative. Second set was negative as well. We've been asked to evaluate. She is currently pain-free.  Inpatient Medications     . acetaminophen  650 mg Oral Once  . aspirin  324 mg Oral Once  . aspirin EC  325 mg Oral Daily  . enoxaparin  40 mg Subcutaneous Q24H  . lisinopril  20 mg Oral Daily   And  . hydrochlorothiazide  25 mg Oral Daily  . nitroGLYCERIN  0.4 mg Transdermal Daily  . nitroGLYCERIN      . pantoprazole  40 mg Oral Q1200  . prednisoLONE  5 mg Oral Q breakfast  . sodium chloride  3 mL Intravenous Q12H  . verapamil  120 mg Oral BID  . DISCONTD: lisinopril-hydrochlorothiazide  1 tablet Oral q morning - 10a    Family History Family History  Problem Relation Age of Onset  . Diabetes type II Mother     alive @ 16     Social History History   Social History  . Marital Status: Single  Spouse Name: N/A    Number of Children: N/A  . Years of Education: N/A   Occupational History  . Not on file.   Social History Main Topics  . Smoking status: Never Smoker   . Smokeless tobacco: Never Used  . Alcohol Use: Yes     Rare drink  . Drug Use: No  . Sexually Active: Not on file   Other Topics Concern  . Not on file   Social History Narrative   Lives @ home in [place] with 1 of her 3 dtrs.  Works in a [employer] in [employer].  Relatively acitve @ work but doesn't exercise @ home.  Doesn't follow any particular diet.     Review of Systems  General:  No chills, fever, night sweats or weight changes.    Cardiovascular: c/p and DOE as outlined above.  No edema, orthopnea, palpitations, paroxysmal nocturnal dyspnea. Dermatological: No rash, lesions/masses Respiratory: No cough, dyspnea Urologic: No hematuria, dysuria Abdominal:   No nausea, vomiting, diarrhea, bright red blood per rectum, melena, or hematemesis Neurologic:  No visual changes, wkns, changes in mental status. All other systems reviewed and are otherwise negative except as noted above.  Physical Exam  Blood pressure 124/77, pulse 58, temperature 97.5 F (36.4 C), temperature source Oral, resp. rate 18, height 5\' 6"  (1.676 m), weight 289 lb 7.4 oz (131.3 kg), SpO2 99.00%.  General: Pleasant, NAD Psych: Normal affect. Neuro: Alert and oriented X 3. Moves all extremities spontaneously. HEENT: Normal  Neck: Supple without bruits.  Obese, difficult to assess JVP. Lungs:  Resp regular and unlabored, CTA. Heart: RRR no s3, s4, or murmurs. Abdomen: obese, soft, non-tender, non-distended, BS + x 4.  Extremities: No clubbing, cyanosis or edema. DP/PT/Radials 2+ and equal bilaterally.  Labs   Basename 07/13/11 0630 07/12/11 2324 07/12/11 1955  CKTOTAL 94 107 108  CKMB 1.7 1.7 1.6  TROPONINI <0.30 <0.30 <0.30   Lab Results  Component Value Date   WBC 5.1 07/13/2011   HGB 11.7* 07/13/2011   HCT 37.1 07/13/2011   MCV 89.0 07/13/2011   PLT 234 07/13/2011     Lab 07/13/11 0630  NA 141  K 3.5  CL 103  CO2 30  BUN 15  CREATININE 0.74  CALCIUM 9.5  PROT 7.1  BILITOT 0.4  ALKPHOS 85  ALT 10  AST 14  GLUCOSE 92   Lab Results  Component Value Date   CHOL 183 07/12/2011   HDL 54 07/12/2011   LDLCALC 99 07/12/2011   TRIG 152* 07/12/2011   Radiology/Studies  Dg Chest 2 View  07/12/2011  *RADIOLOGY REPORT*  Clinical Data: Chest pain today.  Hypertension.  History of breast cancer.  CHEST - 2 VIEW  Comparison: 11/14/2010 radiographs.  Findings: The heart size and mediastinal contours are stable.  The lungs are clear.  There is no  pleural effusion or pneumothorax. There are postsurgical changes status post left mastectomy and breast implantation.  No acute osseous findings are seen.  IMPRESSION: Stable postoperative chest.  No active cardiopulmonary process.  Original Report Authenticated By: Gerrianne Scale, M.D.   ECG  RSR, 60, no acute st/t changes  ASSESSMENT AND PLAN  1. Unstable angina:  Patient presents with a story concerning for angina. Despite prolonged symptoms yesterday enzymes are negative. We've discussed evaluation the patient and plan to proceed with a left heart cardiac catheterization via a radial approach. Continue aspirin and will add a statin.  2.  Hypertension:  Currently stable.  3.  Hyperlipidemia: LDL is 99. We are adding statin in the setting of unstable angina. She has clean coronaries this can likely be discontinued.  4.  Obesity:  Pending cath, patient may benefit from cardiac rehabilitation if appropriate , otw a regular exercise program.   Signed, Nicolasa Ducking, NP 07/13/2011, 7:40 AM   Patient seen, examined. Available data reviewed. Agree with findings, assessment, and plan as outlined by Ward Givens, NP. The patient was independently interviewed and examined. I discussed the treatment plan with her in detail. I agree that cardiac cath and possible PCI is indicated. Her symptoms are highly typical of unstable angina, despite the fact that cardiac enzymes and EKG are unremarkable. If coronaries are ok, suspect diastolic dysfunction/elevated LVEDP as cause of chest tightness as there is clearly an exertional component. Reviewed risks, indication, and alternatives to cath and possible PCI with the patient who agrees to proceed.  Tonny Bollman, M.D. 07/13/2011 7:41 AM

## 2011-07-13 NOTE — Progress Notes (Signed)
Patient Name: Gail Schmitt Date of Encounter: 07/13/2011     Active Problems:  HTN (hypertension)  Chest pain, midsternal  Obesity  Unstable angina pectoris    SUBJECTIVE: In good spirits. Denies chest pain, tingling/color changes in hand or oozing from radial site. No other complaints.   OBJECTIVE  Filed Vitals:   07/12/11 2326 07/13/11 0425 07/13/11 1041 07/13/11 1411  BP: 141/99 124/77  155/84  Pulse: 69 58 66 63  Temp: 97.3 F (36.3 C) 97.5 F (36.4 C)  97 F (36.1 C)  TempSrc: Oral Oral  Oral  Resp: 18 18  20   Height:      Weight:      SpO2: 96% 99%  95%    Intake/Output Summary (Last 24 hours) at 07/13/11 1639 Last data filed at 07/13/11 0900  Gross per 24 hour  Intake      3 ml  Output   1350 ml  Net  -1347 ml   Weight change:   PHYSICAL EXAM  General: Obese, in NAD. Head: Normocephalic, atraumatic, sclera non-icteric, no xanthomas Neck: Supple without bruits or JVD. Lungs:  Resp regular and unlabored, CTAB without wheezes, rales or rhonchi Heart: RRR no s3, s4, or murmurs. Abdomen: Soft, non-tender, non-distended, BS + x 4.  Msk:  Strength and tone appears normal for age. Extremities: R radial access site without erythema, edema or discharge. Nontender to touch. Full strength and sensation to R hand. No bruits. No clubbing, cyanosis or edema. DP/PT/Radials 2+ and equal bilaterally. Neuro: Alert and oriented X 3. Moves all extremities spontaneously. Psych: Normal affect.  LABS:  Recent Labs  Basename 07/13/11 0630 07/12/11 2300   WBC 5.1 6.3   HGB 11.7* 13.3   HCT 37.1 40.2   MCV 89.0 88.0   PLT 234 256    Lab 07/13/11 0630 07/12/11 2300 07/12/11 1954  NA 141 -- 135  K 3.5 -- 4.1  CL 103 -- 98  CO2 30 -- 27  BUN 15 -- 17  CREATININE 0.74 0.73 0.84  CALCIUM 9.5 -- 9.5  PROT 7.1 -- --  BILITOT 0.4 -- --  ALKPHOS 85 -- --  ALT 10 -- --  AST 14 -- --  AMYLASE -- -- --  LIPASE -- -- --  GLUCOSE 92 -- 117*    Recent Labs    Basename 07/13/11 0630 07/12/11 2324 07/12/11 1955   CKTOTAL 94 107 108   CKMB 1.7 1.7 1.6   CKMBINDEX -- -- --   TROPONINI <0.30 <0.30 <0.30    Recent Labs  Basename 07/12/11 2300   CHOL 183   HDL 54   LDLCALC 99   TRIG 152*   CHOLHDL 3.4   LDLDIRECT --    Basename 07/13/11 0630  TSH 0.930  T4TOTAL --  T3FREE --  THYROIDAB --   TELE: NSR/sinus bradycardia at high 50s-80s, no acute changes  ECG: no new tracings  Radiology/Studies:  Dg Chest 2 View  07/12/2011  *RADIOLOGY REPORT*  Clinical Data: Chest pain today.  Hypertension.  History of breast cancer.  CHEST - 2 VIEW  Comparison: 11/14/2010 radiographs.  Findings: The heart size and mediastinal contours are stable.  The lungs are clear.  There is no pleural effusion or pneumothorax. There are postsurgical changes status post left mastectomy and breast implantation.  No acute osseous findings are seen.  IMPRESSION: Stable postoperative chest.  No active cardiopulmonary process.  Original Report Authenticated By: Gerrianne Scale, M.D.    Current Medications:     .  acetaminophen  650 mg Oral Once  . aspirin      . aspirin  324 mg Oral Once  . aspirin EC  325 mg Oral Daily  . aspirin EC  325 mg Oral Daily  . enoxaparin  40 mg Subcutaneous Q24H  . fentaNYL      . heparin      . heparin      . lisinopril  20 mg Oral Daily   And  . hydrochlorothiazide  25 mg Oral Daily  . lidocaine      . midazolam      . nitroGLYCERIN  0.4 mg Transdermal Daily  . nitroGLYCERIN      . nitroGLYCERIN      . pantoprazole  40 mg Oral Q1200  . prednisoLONE  5 mg Oral Q breakfast  . predniSONE  5 mg Oral Q breakfast  . sodium chloride  3 mL Intravenous Q12H  . sodium chloride  3 mL Intravenous Q12H  . verapamil  120 mg Oral BID  . verapamil      . DISCONTD: atorvastatin  80 mg Oral q1800  . DISCONTD: lisinopril-hydrochlorothiazide  1 tablet Oral q morning - 10a    ASSESSMENT AND PLAN:  1. Chest pain- likely in the setting of  hypertension from antihypertensive noncompliance secondary to financial and insurance reasons. Cardiac cath today without evidence of CAD. LVEF 55%. She is pain-free this afternoon and the R radial cath site is without evidence of hematoma or pseudoaneurysm. She is stable for discharge today. Patient states she needs note for work.   2. Hypertension- stable on admission; high this afternoon in the setting of post-cath  - She will resume outpatient antihypertensives  3. Obesity- per PCP  - Advised that with control of this, more adequate control of HTN and HL would be achieved, as well as further cardiac risk factor reduction.  4. History of breast cancer- stable; per PCP, recommend routine follow-up  5. History of dermatomyositis- stable  - Continue outpatient steroids for management  - This and chronic steroid use to be followed by PCP   Will continue all other outpatient meds. She will follow-up with PCP in a few weeks for this admission.  Signed, R. Hurman Horn, PA-C 07/13/2011, 4:39 PM

## 2011-07-13 NOTE — Discharge Instructions (Signed)
PLEASE REMEMBER TO BRING ALL OF YOUR MEDICATIONS TO EACH OF YOUR FOLLOW-UP OFFICE VISITS.  PLEASE FOLLOW AND ATTEND ALL FOLLOW-UP APPOINTMENTS SCHEDULED FOR YOU.  Activity: Increase activity slowly as tolerated. You may shower, but no soaking baths for 1 week. No driving for 2 days. No lifting over 5 lbs for 1 week. No sexual activity for 1 week.   You May Return to Work: On 07/23/11  Wound Care: You may wash cath site gently with soap and water. Keep cath site clean and dry. If you notice pain, swelling, bleeding or pus at your cath site, please call [phone].

## 2011-07-13 NOTE — Discharge Summary (Signed)
Discharge Summary   Patient ID: Gail Schmitt,  MRN: [id number], DOB/AGE: 53-21-1962 53 y.o.  Admit date: 07/12/2011 Discharge date: 07/13/2011  Discharge Diagnoses Active Problems:  HTN (hypertension)  Chest pain, midsternal  Obesity  Unstable angina pectoris   Allergies Allergies  Allergen Reactions  . Percocet (Oxycodone-Acetaminophen) Itching and Rash    Diagnostic Studies/Procedures  Cardiac catheterization  Procedure: After informed consent and clinical "time out" the right radial artery was prepped and draped in a sterile fashion. Allen's test was documented to be normal both by manual compression and plethosmography prior to cannulation. A 5Fr hydrophilic sheath was advanced using a .025 nitinol wire. Catheters were advanced into the ascending aortic root with a .035 J wire and fluoroscopic guidance. Standard JL3.5, and JR4 catheters were used to engage the coronaries. A standard angled pigtail catheter was used for ventriculography. Coronary arteries were visualized in orthogonal views using caudal and cranial angulation. RAO ventriculography was done using 24 cc of contrast.  Medications:  Versed: 2 mg's Fentanyl: 25 ug's Heparin: 5000 units Verapamil: 3 mg's  Coronary Arteries:  Left dominant with no anomalies  LM: Normal   LAD: Normal  D1: Normal  Circumflex: Normal  OM!: High take off large artery bifurcating normal  OM2: Normal  AV groove normal  RCA: nondominant normal. No true PDA but distal circumflex and OM system much larger Ventriculography: EF: 55%, NSVT during injection  Hemodynamics:  Aortic Pressure: 142 87 mmHg LV Pressure: 138 11 mmHg  Impression: No CAD D/C home latter today  At the end of the case a TR band was placed with good hemostasis (12 cc at 11:25 am) and flow to the hand including the radial aspect of the thumb.  2D echocardiogram  Pending  History of Present Illness  [name] is a 51yo AA female with PMHx significant for  unstable angina (s/p cardiac cath, normal per patient, but "pressures high"), HTN, obesity, dermatomyositis and hx of breast cancer s/p L mastectomy who was admitted to [facility] hospital on 07/12/11 with chest pain.  The patient reported experiencing chest pain intermittently x 2 weeks, left-sided with some associated SOB lasting approximately 15 minutes at a time. She states she had been out of her antihypertensives during this time, and recalls having elevated DBP in the 100s.She endorsed increased DOE and exertional chest pain. This would apparently be relieved with rest. The morning of admission, upon showering, she experienced a more persistent chest pain radiating to her left arm. It was described as pressure-like. She denied diaphoresis, nausea/vomiting, palpitations or dizziness.  EMS was called and she was transported to [facility] ED. Her chest pain improved after NTG SL x 1. She took a full-dose ASA. EKG without evidence of ischemia. CEs neg x 2. CXR without acute cardiopulmonary process.  Hospital Course   She was subsequently admitted by internal medicine with cardiology consultation. She was continued on outpatient home medications. She was continued on outpatient meds. Her last set of CEs returned negative, however given her HPI concerning for unstable angina, she was scheduled for cardiac catheterization +/- PCI.   This morning, prior to the procedure, she endorsed chest pain rated at a 4/10 and less severe from yesterday. She denied other symptoms. Her primary care team was transferred from internal medicine to cardiology. Vital signs remained stable, she was asymptomatic without acute events.  She was informed, consented and agreed to the procedure which began with R radial access and elicited the above details including LVEF 55%, NSVT  elicited on injection of contrast during ventriculography, resolved and no CAD noted. She tolerated the procedure well without immediate complications. TR band was  placed and she was cardiac cath holding and later telemetry. Adequate hemostasis was achieved and the TR band was removed. She was asymptomatic post-procedure. There was no evidence of hematoma or pseudoaneurysm on exam.   She is stable, at baseline and will be discharged home. Her chest pain may have likely been in the setting of hypertension during which she was not taking her home antihypertensives. She will resume outpatient medications. She will be advised to follow-up with her PCP for management of her chronic medical issues including adequate control of hypertension and obesity. Additionally, she will follow-up with the appointments made below. This information, including activity restrictions has been clearly outlined in the AVS. I have also written a work note. At the time of this discharge summary, 2D echocardiogram was performed with the results pending.   Discharge Vitals:  Blood pressure 155/84, pulse 63, temperature 97 F (36.1 C), temperature source Oral, resp. rate 20, height 5\' 6"  (1.676 m), weight 131.3 kg (289 lb 7.4 oz), SpO2 95.00%.   Labs: Recent Labs  Basename 07/13/11 0630 07/12/11 2300   WBC 5.1 6.3   HGB 11.7* 13.3   HCT 37.1 40.2   MCV 89.0 88.0   PLT 234 256   Lab 07/13/11 0630 07/12/11 2300 07/12/11 1954  NA 141 -- 135  K 3.5 -- 4.1  CL 103 -- 98  CO2 30 -- 27  BUN 15 -- 17  CREATININE 0.74 0.73 0.84  CALCIUM 9.5 -- 9.5  PROT 7.1 -- --  BILITOT 0.4 -- --  ALKPHOS 85 -- --  ALT 10 -- --  AST 14 -- --  AMYLASE -- -- --  LIPASE -- -- --  GLUCOSE 92 -- 117*    Recent Labs  Basename 07/13/11 0630 07/12/11 2324 07/12/11 1955   CKTOTAL 94 107 108   CKMB 1.7 1.7 1.6   CKMBINDEX -- -- --   TROPONINI <0.30 <0.30 <0.30    Recent Labs  Basename 07/12/11 2300   CHOL 183   HDL 54   LDLCALC 99   TRIG 152*   CHOLHDL 3.4   LDLDIRECT --    Basename 07/13/11 0630  TSH 0.930  T4TOTAL --  T3FREE --  THYROIDAB --    Disposition:   Follow-up  Information    Follow up with Lupita Raider, MD on 08/13/2011. (appt at 11:00)    Contact information:   [address] [place], [id number] [phone]       Follow up with [facility] on 07/20/2011. (appt  with nurse for BP check at 10;00)    Contact information:   [address]  [place], [place] [address] [phone]        Discharge Medications:  Medication List  As of 07/13/2011  4:08 PM   ASK your doctor about these medications         calcium carbonate 1250 MG tablet   Commonly known as: OS-CAL - dosed in mg of elemental calcium      lisinopril-hydrochlorothiazide 20-25 MG per tablet   Commonly known as: PRINZIDE,ZESTORETIC      predniSONE 5 MG tablet   Commonly known as: DELTASONE      verapamil 120 MG tablet   Commonly known as: CALAN           Outstanding Labs/Studies: 2D echocardiogram  Duration of Discharge Encounter: Greater than 30  minutes including physician time.  Signed, R. Hurman Horn, PA-C 07/13/2011, 4:08 PM

## 2011-07-13 NOTE — Brief Op Note (Signed)
See operative note  Maricel Swartzendruber  

## 2011-07-13 NOTE — Op Note (Signed)
Catheterization  Name: Gail Schmitt MRN: [id number] DOB: 1961/04/29  Indication: Chest Pain  Procedure:  After informed consent and clinical "time out" the right radial artery was prepped and draped in a sterile fashion. Allen's test was documented to be normal both by manual compression and plethosmography prior to cannulation.  A 5Fr hydrophilic sheath was advanced using a .025 nitinol wire. Catheters were advanced into the ascending aortic root with a .035 J wire and fluoroscopic guidance.  Standard JL3.5, and JR4 catheters were used to engage the coronaries.  A standard angled pigtail catheter was used for ventriculography.  Coronary arteries were visualized in orthogonal views using caudal and cranial angulation.  RAO ventriculography was done using 24  cc of contrast.    Medications:   Versed: 2 mg's  Fentanyl: 25 ug's  Heparin: 5000 units  Verapamil: 3 mg's  Coronary Arteries:  Left  dominant with no anomalies  LM: Normal  LAD:  Normal  D1: Normal  Circumflex: Normal  OM!:  High take off large artery bifurcating normal  OM2: Normal  AV groove normal  RCA: nondominant normal.  No true PDA but distal circumflex and OM system much larger    Ventriculography: EF: 55%,  NSVT during injection  Hemodynamics:  Aortic Pressure: 142 87 mmHg  LV Pressure: 138 11 mmHg  Impression:  No CAD  D/C home latter today  At the end of the case a TR band was placed with good hemostasis (12 cc at 11:25 am)  and flow to the hand including the radial aspect of the thumb.

## 2011-07-13 NOTE — Progress Notes (Signed)
See cath note.  Ok to D/C with normal cors [employer]

## 2011-07-13 NOTE — Progress Notes (Signed)
07/13/2011 Anoop Hemmer [facility] Case Management Note [phone]  Utilization review completed.  

## 2011-07-13 NOTE — Progress Notes (Signed)
Patient informed the nurse that she was experiencing chest pain. Nurse asked patient to describe the pain and pain severity from 0 - 10. Patient informed the nurse that her pain was an 8. Nurse administered to the patient a sublingual nitro tab under the tongue. Nurse also performed vitals: temp; 97.3, HR; 69, BP; 141/99, RR; 18, Sats; 96 2L. Nurse recruited nurse tech to perform a ECG. ECG showed Sinus Rhythm with ST and T wave abnormality. Patient articulated to the nurse, 5 minutes later, that her chest pain had subsided and was now nonexistent. Gail Schmitt

## 2011-07-13 NOTE — Interval H&P Note (Signed)
History and Physical Interval Note:  07/13/2011 10:58 AM  Gail Schmitt  has presented today for surgery, with the diagnosis of chest pain  The various methods of treatment have been discussed with the patient and family. After consideration of risks, benefits and other options for treatment, the patient has consented to  Procedure(s) (LRB): LEFT HEART CATHETERIZATION WITH CORONARY ANGIOGRAM (N/A) as a surgical intervention .  The patients' history has been reviewed, patient examined, no change in status, stable for surgery.  I have reviewed the patients' chart and labs.  Questions were answered to the patient's satisfaction.     Charlton Haws  See consult note from Dr Excell Seltzer.  10:58 AM 07/13/2011

## 2011-07-13 NOTE — Progress Notes (Signed)
PROGRESS NOTE  Gail Schmitt NFA:[id number] DOB: 1960-10-23 DOA: 07/12/2011 PCP: Lupita Raider, MD, MD  Brief narrative: 51 year old African American female admitted 3/7 with exertional shortness of breath and radiation to left arm  Past medical history: Dermatome myositis, history breast cancer status post mastectomy chemotherapy 1998, hypertension  Consultants:  Cardiology  Procedures:  Chest x-ray 07/12/10 = stable postoperative chest with no acute cardiopulmonary  Antibiotics:  None   Subjective  States that she has 4/10 chest pain. Nothing like it was yesterday. No other real issues at present time. No shortness of breath no nausea no vomiting. Has had this chest pain for about 2 weeks with exertion. Got significantly worse yesterday after being in the shower Had to rest for prolonged period of time and seemed to dissipate but then worsened with more activity and hence came to the emergency room Currently doing fair   Objective   Interim History: Cardiology notes reviewed-is apparently for cardiac catheterization later today   Objective: Filed Vitals:   07/12/11 2059 07/12/11 2218 07/12/11 2326 07/13/11 0425  BP: 127/71 148/93 141/99 124/77  Pulse: 74 62 69 58  Temp:  97.8 F (36.6 C) 97.3 F (36.3 C) 97.5 F (36.4 C)  TempSrc:  Oral Oral Oral  Resp: 17 17 18 18   Height:  5\' 6"  (1.676 m)    Weight:  131.3 kg (289 lb 7.4 oz)    SpO2: 99% 95% 96% 99%    Intake/Output Summary (Last 24 hours) at 07/13/11 1054 Last data filed at 07/13/11 0900  Gross per 24 hour  Intake      3 ml  Output   1350 ml  Net  -1347 ml    Exam:  General: Alert, pleasant African American female in no apparent distress, morbidly obese Cardiovascular: S1-S2 no murmur rub or gallop, and no bruit noted, JVD not assessed Respiratory: Clinically clear with no added sounds. No tactile vocal resonance, no tactile vocal fremitus Abdomen: Obese, nontender, nondistended Skin no lower  extremity edema Neuro grossly intact moving all 4 limbs equally face symmetric  Data Reviewed: Basic Metabolic Panel:  Lab 07/13/11 4696 07/12/11 2300 07/12/11 1954  NA 141 -- 135  K 3.5 -- 4.1  CL 103 -- 98  CO2 30 -- 27  GLUCOSE 92 -- 117*  BUN 15 -- 17  CREATININE 0.74 0.73 0.84  CALCIUM 9.5 -- 9.5  MG -- -- --  PHOS -- -- --   Liver Function Tests:  Lab 07/13/11 0630  AST 14  ALT 10  ALKPHOS 85  BILITOT 0.4  PROT 7.1  ALBUMIN 3.1*   No results found for this basename: LIPASE:5,AMYLASE:5 in the last 168 hours No results found for this basename: AMMONIA:5 in the last 168 hours CBC:  Lab 07/13/11 0630 07/12/11 2300 07/12/11 1954  WBC 5.1 6.3 5.8  NEUTROABS -- -- 3.9  HGB 11.7* 13.3 12.3  HCT 37.1 40.2 39.4  MCV 89.0 88.0 89.5  PLT 234 256 234   Cardiac Enzymes:  Lab 07/13/11 0630 07/12/11 2324 07/12/11 1955  CKTOTAL 94 107 108  CKMB 1.7 1.7 1.6  CKMBINDEX -- -- --  TROPONINI <0.30 <0.30 <0.30   BNP: No components found with this basename: POCBNP:5 CBG: No results found for this basename: GLUCAP:5 in the last 168 hours  No results found for this or any previous visit (from the past 240 hour(s)).   Studies: Dg Chest 2 View  07/12/2011  *RADIOLOGY REPORT*  Clinical Data: Chest pain today.  Hypertension.  History of breast cancer.  CHEST - 2 VIEW  Comparison: 11/14/2010 radiographs.  Findings: The heart size and mediastinal contours are stable.  The lungs are clear.  There is no pleural effusion or pneumothorax. There are postsurgical changes status post left mastectomy and breast implantation.  No acute osseous findings are seen.  IMPRESSION: Stable postoperative chest.  No active cardiopulmonary process.  Original Report Authenticated By: Gerrianne Scale, M.D.    Scheduled Meds:   . acetaminophen  650 mg Oral Once  . aspirin  324 mg Oral Once  . aspirin EC  325 mg Oral Daily  . atorvastatin  80 mg Oral q1800  . enoxaparin  40 mg Subcutaneous Q24H  .  heparin      . lisinopril  20 mg Oral Daily   And  . hydrochlorothiazide  25 mg Oral Daily  . lidocaine      . midazolam      . nitroGLYCERIN  0.4 mg Transdermal Daily  . nitroGLYCERIN      . nitroGLYCERIN      . pantoprazole  40 mg Oral Q1200  . prednisoLONE  5 mg Oral Q breakfast  . predniSONE  5 mg Oral Q breakfast  . sodium chloride  3 mL Intravenous Q12H  . verapamil  120 mg Oral BID  . DISCONTD: lisinopril-hydrochlorothiazide  1 tablet Oral q morning - 10a   Continuous Infusions:   . sodium chloride 1,000 mL (07/12/11 2311)  . sodium chloride       Assessment/Plan: 1. Shortness of breath and chest pain-features concerning enough to warrant invasive procedure per cardiology. If patient does have findings consistent with the same, will request cardiology takeover as primary-appreciate assistance in advance. Continue aspirin 325 and Nitro-Derm patch. Troponins are negative so far 2. Hypertension-continue lisinopril 20 mg, hydrochlorothiazide 25 mg, verapamil 120 mg twice a day 3. Hyperlipidemia-triglycerides 152 LDL is 99. Placed on statin. Would continue 4. History of breast cancer-need periodic screening as outpatient stable problem  Code Status: Full  Family Communication: None at bedside Disposition Plan: Per cardiology-please see first part of my note.  Thanks.   Pleas Koch, MD  [facility] Pager [phone] 07/13/2011, 10:54 AM    LOS: 1 day

## 2011-08-13 ENCOUNTER — Other Ambulatory Visit: Payer: Self-pay | Admitting: Family Medicine

## 2011-08-13 ENCOUNTER — Other Ambulatory Visit (HOSPITAL_COMMUNITY): Payer: Self-pay | Admitting: Family Medicine

## 2011-08-13 DIAGNOSIS — N644 Mastodynia: Secondary | ICD-10-CM

## 2011-08-13 DIAGNOSIS — IMO0002 Reserved for concepts with insufficient information to code with codable children: Secondary | ICD-10-CM

## 2011-08-15 ENCOUNTER — Ambulatory Visit
Admission: RE | Admit: 2011-08-15 | Discharge: 2011-08-15 | Disposition: A | Discharge status: Home / Self Care | Payer: Self-pay | Source: Ambulatory Visit | Attending: Family Medicine | Admitting: Family Medicine

## 2011-08-15 DIAGNOSIS — N644 Mastodynia: Secondary | ICD-10-CM

## 2011-08-16 ENCOUNTER — Ambulatory Visit (HOSPITAL_COMMUNITY)
Admission: RE | Admit: 2011-08-16 | Discharge: 2011-08-16 | Disposition: A | Discharge status: Home / Self Care | Payer: Self-pay | Source: Ambulatory Visit | Attending: Family Medicine | Admitting: Family Medicine

## 2011-08-16 DIAGNOSIS — Z13820 Encounter for screening for osteoporosis: Secondary | ICD-10-CM | POA: Insufficient documentation

## 2011-08-16 DIAGNOSIS — IMO0002 Reserved for concepts with insufficient information to code with codable children: Secondary | ICD-10-CM | POA: Insufficient documentation

## 2013-10-08 ENCOUNTER — Emergency Department (HOSPITAL_COMMUNITY)
Admission: EM | Admit: 2013-10-08 | Discharge: 2013-10-08 | Disposition: A | Discharge status: Home / Self Care | Payer: BC Managed Care – PPO | Source: Home / Self Care | Attending: Emergency Medicine | Admitting: Emergency Medicine

## 2013-10-08 ENCOUNTER — Encounter (HOSPITAL_COMMUNITY): Payer: Self-pay | Admitting: Emergency Medicine

## 2013-10-08 DIAGNOSIS — J069 Acute upper respiratory infection, unspecified: Secondary | ICD-10-CM

## 2013-10-08 MED ORDER — AMOXICILLIN-POT CLAVULANATE 875-125 MG PO TABS: 1.0000 | ORAL_TABLET | Freq: Two times a day (BID) | ORAL | Status: DC

## 2013-10-08 MED ORDER — FLUTICASONE PROPIONATE 50 MCG/ACT NA SUSP: 2.0000 | Freq: Every day | NASAL | Status: DC

## 2013-10-08 NOTE — ED Provider Notes (Signed)
  Chief Complaint   Chief Complaint  Patient presents with  . URI    History of Present Illness   Gail Schmitt is a 52 year old female who has had a four-day history of scratchy throat, postnasal drip, chills, subjective fever, headache, sinus pressure, nasal congestion, redness of the eyes, nausea, and diarrhea. She denies any sore throat, nasal drainage, cough, or vomiting. She has had no sick exposures.  Review of Systems   Other than as noted above, the patient denies any of the following symptoms: Systemic:  No fevers, chills, sweats, or myalgias. Eye:  No redness or discharge. ENT:  No ear pain, headache, nasal congestion, drainage, sinus pressure, or sore throat. Neck:  No neck pain, stiffness, or swollen glands. Lungs:  No cough, sputum production, hemoptysis, wheezing, chest tightness, shortness of breath or chest pain. GI:  No abdominal pain, nausea, vomiting or diarrhea.  [place]   Past medical history, family history, social history, meds, and allergies were reviewed.   Physical exam   Vital signs:  BP 138/88  Pulse 76  Temp(Src) 98 F (36.7 C) (Oral)  Resp 20  SpO2 97% General:  Alert and oriented.  In no distress.  Skin warm and dry. Eye:  No conjunctival injection or drainage. Lids were normal. ENT:  TMs and canals were normal, without erythema or inflammation.  Nasal mucosa was clear and uncongested, without drainage.  Mucous membranes were moist.  Pharynx was clear with no exudate or drainage.  There were no oral ulcerations or lesions. Neck:  Supple, no adenopathy, tenderness or mass. Lungs:  No respiratory distress.  Lungs were clear to auscultation, without wheezes, rales or rhonchi.  Breath sounds were clear and equal bilaterally.  Heart:  Regular rhythm, without gallops, murmers or rubs. Skin:  Clear, warm, and dry, without rash or lesions.   Assessment     The encounter diagnosis was Viral URI.  Patient was given the delay prescription for  Augmentin, if no improvement in 3 or 4 days.  Plan    1.  Meds:  The following meds were prescribed:   New Prescriptions   AMOXICILLIN-CLAVULANATE (AUGMENTIN) 875-125 MG PER TABLET    Take 1 tablet by mouth 2 (two) times daily.   FLUTICASONE (FLONASE) 50 MCG/ACT NASAL SPRAY    Place 2 sprays into both nostrils daily.    2.  Patient Education/Counseling:  The patient was given appropriate handouts, self care instructions, and instructed in symptomatic relief.  Instructed to get extra fluids, rest, and use a cool mist vaporizer.    3.  Follow up:  The patient was told to follow up here if no better in 3 to 4 days, or sooner if becoming worse in any way, and given some red flag symptoms such as increasing fever, difficulty breathing, chest pain, or persistent vomiting which would prompt immediate return.  Follow up here as needed.      Harden Mo, MD 10/08/13 [phone]

## 2013-10-08 NOTE — Discharge Instructions (Signed)

## 2013-10-08 NOTE — ED Notes (Signed)
Pt  Has  Sinus  Congestion  sorethroat     Headache     And  Stomach  Pain  With  The  Symptoms  X  3  Days  Symptoms  Are  Not  releived  By  otc  meds

## 2013-12-30 ENCOUNTER — Emergency Department (HOSPITAL_COMMUNITY): Payer: BC Managed Care – PPO

## 2013-12-30 ENCOUNTER — Emergency Department (HOSPITAL_COMMUNITY)
Admission: EM | Admit: 2013-12-30 | Discharge: 2013-12-30 | Disposition: A | Discharge status: Home / Self Care | Payer: BC Managed Care – PPO | Attending: Emergency Medicine | Admitting: Emergency Medicine

## 2013-12-30 ENCOUNTER — Encounter (HOSPITAL_COMMUNITY): Payer: Self-pay | Admitting: Emergency Medicine

## 2013-12-30 DIAGNOSIS — R079 Chest pain, unspecified: Secondary | ICD-10-CM | POA: Insufficient documentation

## 2013-12-30 DIAGNOSIS — I10 Essential (primary) hypertension: Secondary | ICD-10-CM | POA: Diagnosis not present

## 2013-12-30 DIAGNOSIS — Z8739 Personal history of other diseases of the musculoskeletal system and connective tissue: Secondary | ICD-10-CM | POA: Diagnosis not present

## 2013-12-30 DIAGNOSIS — IMO0002 Reserved for concepts with insufficient information to code with codable children: Secondary | ICD-10-CM | POA: Diagnosis not present

## 2013-12-30 DIAGNOSIS — Z853 Personal history of malignant neoplasm of breast: Secondary | ICD-10-CM | POA: Insufficient documentation

## 2013-12-30 DIAGNOSIS — E669 Obesity, unspecified: Secondary | ICD-10-CM | POA: Insufficient documentation

## 2013-12-30 DIAGNOSIS — R209 Unspecified disturbances of skin sensation: Secondary | ICD-10-CM | POA: Diagnosis not present

## 2013-12-30 DIAGNOSIS — Z792 Long term (current) use of antibiotics: Secondary | ICD-10-CM | POA: Insufficient documentation

## 2013-12-30 DIAGNOSIS — R0789 Other chest pain: Secondary | ICD-10-CM

## 2013-12-30 DIAGNOSIS — Z79899 Other long term (current) drug therapy: Secondary | ICD-10-CM | POA: Diagnosis not present

## 2013-12-30 DIAGNOSIS — R110 Nausea: Secondary | ICD-10-CM | POA: Diagnosis not present

## 2013-12-30 LAB — CBC WITH DIFFERENTIAL/PLATELET
BASOS ABS: 0 10*3/uL (ref 0.0–0.1)
BASOS PCT: 1 % (ref 0–1)
EOS PCT: 6 % — AB (ref 0–5)
Eosinophils Absolute: 0.3 10*3/uL (ref 0.0–0.7)
HEMATOCRIT: 39.6 % (ref 36.0–46.0)
Hemoglobin: 13 g/dL (ref 12.0–15.0)
Lymphocytes Relative: 44 % (ref 12–46)
Lymphs Abs: 2.2 10*3/uL (ref 0.7–4.0)
MCH: 28.8 pg (ref 26.0–34.0)
MCHC: 32.8 g/dL (ref 30.0–36.0)
MCV: 87.6 fL (ref 78.0–100.0)
MONO ABS: 0.3 10*3/uL (ref 0.1–1.0)
Monocytes Relative: 7 % (ref 3–12)
Neutro Abs: 2 10*3/uL (ref 1.7–7.7)
Neutrophils Relative %: 42 % — ABNORMAL LOW (ref 43–77)
Platelets: 243 10*3/uL (ref 150–400)
RBC: 4.52 MIL/uL (ref 3.87–5.11)
RDW: 13.2 % (ref 11.5–15.5)
WBC: 4.9 10*3/uL (ref 4.0–10.5)

## 2013-12-30 LAB — COMPREHENSIVE METABOLIC PANEL
ALBUMIN: 3.7 g/dL (ref 3.5–5.2)
ALK PHOS: 90 U/L (ref 39–117)
ALT: 11 U/L (ref 0–35)
AST: 20 U/L (ref 0–37)
Anion gap: 14 (ref 5–15)
BILIRUBIN TOTAL: 0.3 mg/dL (ref 0.3–1.2)
BUN: 15 mg/dL (ref 6–23)
CHLORIDE: 100 meq/L (ref 96–112)
CO2: 27 mEq/L (ref 19–32)
Calcium: 9.6 mg/dL (ref 8.4–10.5)
Creatinine, Ser: 0.73 mg/dL (ref 0.50–1.10)
GFR calc Af Amer: >90 mL/min (ref >90)
GFR calc non Af Amer: >90 mL/min (ref >90)
Glucose, Bld: 78 mg/dL (ref 70–99)
POTASSIUM: 3.8 meq/L (ref 3.7–5.3)
Sodium: 141 mEq/L (ref 137–147)
Total Protein: 8.1 g/dL (ref 6.0–8.3)

## 2013-12-30 LAB — I-STAT TROPONIN, ED: TROPONIN I, POC: 0 ng/mL (ref 0.00–0.08)

## 2013-12-30 MED ORDER — KETOROLAC TROMETHAMINE 30 MG/ML IJ SOLN
30.0000 mg | Freq: Once | INTRAMUSCULAR | Status: AC
  Administered 2013-12-30: 30 mg via INTRAVENOUS
  Filled 2013-12-30: qty 1

## 2013-12-30 MED ORDER — DEXAMETHASONE SODIUM PHOSPHATE 10 MG/ML IJ SOLN
10.0000 mg | Freq: Once | INTRAMUSCULAR | Status: AC
  Administered 2013-12-30: 10 mg via INTRAVENOUS
  Filled 2013-12-30: qty 1

## 2013-12-30 MED ORDER — ONDANSETRON HCL 4 MG/2ML IJ SOLN
4.0000 mg | Freq: Once | INTRAMUSCULAR | Status: AC
  Administered 2013-12-30: 4 mg via INTRAVENOUS
  Filled 2013-12-30: qty 2

## 2013-12-30 NOTE — ED Provider Notes (Signed)
CSN: [id number]     Arrival date & time 12/30/13  1248 History   First MD Initiated Contact with Patient 12/30/13 1620     Chief Complaint  Patient presents with  . Chest Pain   HPI Gail Schmitt is 53 yo female with PMH: Breast CA, HTN, Irregular heartbeat, presenting to the ED with c/o chest pain onset this am.  Pt reports she was in her usual state of health and had been preparing for work and was brushing her hair.  As she finished brushing her hair, she noticed a sharp pain under her left breast.  She rates the pain as 9-10 and describes it as a knife through to her back.  She reports the pain, later began to radiate down her left lateral chest wall and then her 4th and 5th fingers felt "tingly".  She also reports nausea during this time and tingling in her left foot and toes, but recalls this has been bothering her for about 1 week.   She does do repetitive lifting, twisting and bending motions at work.  She denies fevers, chills, vomiting, diarrhea, blurred vision, headaches, shortness of breath, dysuria, bowel and bladder incontinence or difficulty walking.  She has not falling or had any known injury.   Past Medical History  Diagnosis Date  . Hypertension   . Breast cancer, left breast     a. s/p left mastectomy, 1999  . Unstable angina     a. Cath ~ 10 + yrs ago, [facility] [employer] [facility], [place] - "normal but pressures were high."  . Obesity   . Dermatomyositis     a. noted prior to diagnosis of Breast CA ("i'm allergic to cancer")   Past Surgical History  Procedure Laterality Date  . Laparoscopic total hysterectomy    . Mastectomy      left side, 10 years ago  . Breast surgery     Family History  Problem Relation Age of Onset  . Diabetes type II Mother     alive @ 52   History  Substance Use Topics  . Smoking status: Never Smoker   . Smokeless tobacco: Never Used  . Alcohol Use: Yes     Comment: Rare drink   OB History   Grav Para Term Preterm Abortions TAB SAB Ect Mult Living                 Review of Systems  Constitutional: Negative for fever and chills.  HENT: Negative for sore throat.   Eyes: Negative for visual disturbance.  Respiratory: Negative for cough and shortness of breath.   Cardiovascular: Negative for chest pain and leg swelling.  Gastrointestinal: Negative for nausea, vomiting and diarrhea.  Genitourinary: Negative for dysuria.  Musculoskeletal: Negative for myalgias.  Skin: Negative for rash.  Neurological: Negative for weakness, numbness and headaches.      Allergies  Percocet  Home Medications   Prior to Admission medications   Medication Sig Start Date End Date Taking? Authorizing Provider  amoxicillin-clavulanate (AUGMENTIN) 875-125 MG per tablet Take 1 tablet by mouth 2 (two) times daily. 10/08/13   Harden Mo, MD  calcium carbonate (OS-CAL - DOSED IN MG OF ELEMENTAL CALCIUM) 1250 MG tablet Take 1 tablet by mouth daily.    Historical Provider, MD  fluticasone (FLONASE) 50 MCG/ACT nasal spray Place 2 sprays into both nostrils daily. 10/08/13   Harden Mo, MD  lisinopril-hydrochlorothiazide (PRINZIDE,ZESTORETIC) 20-25 MG per tablet Take 1 tablet by mouth every morning.  Historical Provider, MD  predniSONE (DELTASONE) 5 MG tablet Take 5 mg by mouth daily.    Historical Provider, MD  verapamil (CALAN) 120 MG tablet Take by mouth 2 (two) times daily.      Historical Provider, MD   BP 147/78  Pulse 52  Temp(Src) 97.8 F (36.6 C) (Oral)  Resp 14  SpO2 100% Physical Exam  Nursing note and vitals reviewed. Constitutional: She is oriented to person, place, and time. She appears well-developed and well-nourished. No distress.  HENT:  Head: Normocephalic and atraumatic.  Mouth/Throat: Oropharynx is clear and moist. No oropharyngeal exudate.  Eyes: Conjunctivae are normal. Pupils are equal, round, and reactive to light.  Neck: Neck supple. No thyromegaly present.  Cardiovascular: Normal rate, normal heart sounds and intact distal  pulses.  An irregular rhythm present.  Pulses:      Radial pulses are 2+ on the right side, and 2+ on the left side.       Dorsalis pedis pulses are 2+ on the right side, and 2+ on the left side.  Pt's chest pain is reported as better but still there, she reports as an 7-8/10.  Reports pain is worse when taking a deep breath, on movement and palpation.    Pulmonary/Chest: Effort normal and breath sounds normal. No accessory muscle usage. Not tachypneic. No respiratory distress. She has no decreased breath sounds. She has no wheezes. She has no rhonchi. She has no rales. Chest wall is not dull to percussion. She exhibits tenderness. She exhibits no mass, no laceration, no crepitus, no edema, no deformity, no swelling and no retraction. Left breast exhibits no skin change and no tenderness.    Surgical scar noted under left breast from reconstruction post breast CA.  Abdominal: Soft. Normal appearance and bowel sounds are normal. There is no hepatosplenomegaly. There is no tenderness. There is no CVA tenderness.  Musculoskeletal:       Cervical back: She exhibits tenderness.       Lumbar back: She exhibits tenderness. She exhibits no swelling and no deformity.       Back:  On exam, pt has tenderness to palpation in the noted areas, but denies fall or recent traumas.  No deformity or swelling noted.  Lymphadenopathy:    She has no cervical adenopathy.  Neurological: She is alert and oriented to person, place, and time. She has normal strength. No cranial nerve deficit. Coordination normal. GCS eye subscore is 4. GCS verbal subscore is 5. GCS motor subscore is 6.  5/5 strength and in all extremities, good ROM and does not have numbness.  Skin: Skin is warm and dry. No rash noted. She is not diaphoretic.  Psychiatric: She has a normal mood and affect.    ED Course  Procedures (including critical care time) Labs Review Labs Reviewed  CBC WITH DIFFERENTIAL - Abnormal; Notable for the following:     Neutrophils Relative % 42 (*)    Eosinophils Relative 6 (*)    All other components within normal limits  COMPREHENSIVE METABOLIC PANEL  Randolm Idol, ED    Imaging Review Dg Chest 2 View  12/30/2013   CLINICAL DATA:  Chest pain.  EXAM: CHEST  2 VIEW  COMPARISON:  PA and lateral chest 11/14/2010.  CT chest 04/06/2010.  FINDINGS: The lungs are clear. Heart size is normal. No pneumothorax or pleural effusion. Breast implant on the left is noted.  IMPRESSION: No acute disease.   Electronically Signed   By: Inge Rise M.D.  On: 12/30/2013 15:13     EKG Interpretation None      MDM   Final diagnoses:  Atypical chest pain   Pt is 53 yo female with reproducible chest pain onset today.  Chest pain is not likely of cardiac or pulmonary etiology d/t presentation, heart score: 2, VSS, no tracheal deviation, no JVD or new murmur, RRR, breath sounds equal bilaterally, EKG without acute abnormalities, negative troponin, and negative CXR. Pt reports "tingling" in left 4th and fth finger and some toes in her left foot but reports this is not new and has been going Pt has been advised to take an anti-inflamatory for pain as it appears to be musculoskeletal in nature and return to the ED is CP becomes exertional, associated with diaphoresis or nausea, radiates to left jaw/arm, worsens or becomes concerning in any way.  Patient is to be discharged with recommendation to follow up with PCP in regards to today's hospital visit. Pt appears reliable for follow up and is agreeable to discharge.  Return precautions given.     Case has been discussed with and seen by Dr. Colin Rhein who agrees with the above plan to discharge.      Britt Bottom, NP 12/31/13 4982  Britt Bottom, NP 12/31/13 [phone]

## 2013-12-30 NOTE — Discharge Instructions (Signed)
Please follow the instructions provided.  Be sure to follow up with your primary care provider to ensure this pain is improving.  You may take 600 mg ibuprofen every 6-8 hours, be sure to take with food.   Your caregiver has diagnosed you as having chest pain that is not specific for one problem, but does not require admission.  You are at low risk for an acute heart condition or other serious illness. Chest pain comes from many different causes.   SEEK IMMEDIATE MEDICAL ATTENTION IF: You have severe chest pain, especially if the pain is crushing or pressure-like and spreads to the arms, back, neck, or jaw, or if you have sweating, nausea (feeling sick to your stomach), or shortness of breath. THIS IS AN EMERGENCY. Don't wait to see if the pain will go away. Get medical help at once. Call 911 or 0 (operator). DO NOT drive yourself to the hospital.  Your chest pain gets worse and does not go away with rest.  You have an attack of chest pain lasting longer than usual, despite rest and treatment with the medications your caregiver has prescribed.  You wake from sleep with chest pain or shortness of breath.  You feel dizzy or faint.  You have chest pain not typical of your usual pain for which you originally saw your caregiver.  Emergency Department Resource Guide 1) Find a Doctor and Pay Out of Pocket Although you won't have to find out who is covered by your insurance plan, it is a good idea to ask around and get recommendations. You will then need to call the office and see if the doctor you have chosen will accept you as a new patient and what types of options they offer for patients who are self-pay. Some doctors offer discounts or will set up payment plans for their patients who do not have insurance, but you will need to ask so you aren't surprised when you get to your appointment.  2) Contact Your Local Health Department Not all health departments have doctors that can see patients for sick  visits, but many do, so it is worth a call to see if yours does. If you don't know where your local health department is, you can check in your phone book. The CDC also has a tool to help you locate your state's health department, and many state websites also have listings of all of their local health departments.  3) Find a [facility] If your illness is not likely to be very severe or complicated, you may want to try a walk in clinic. These are popping up all over the country in pharmacies, drugstores, and shopping centers. They're usually staffed by nurse practitioners or physician assistants that have been trained to treat common illnesses and complaints. They're usually fairly quick and inexpensive. However, if you have serious medical issues or chronic medical problems, these are probably not your best option.  No Primary Care Doctor: - Call Health Connect at  [phone] - they can help you locate a primary care doctor that  accepts your insurance, provides certain services, etc. - Physician Referral Service- [phone]  Chronic Pain Problems: Organization         Address  Phone   Notes  [facility]  [phone] Patients need to be referred by their primary care doctor.   Medication Assistance: Organization         Address  Phone   Notes  [facility] Medication Assistance  Program [address] [place], [place] [address] [phone] --Must be a resident of [facility] -- Must have NO insurance coverage whatsoever (no Medicaid/ Medicare, etc.) -- The pt. MUST have a primary care doctor that directs their care regularly and follows them in the community   MedAssist  [phone]   [employer]  [phone]    Agencies that provide inexpensive medical care: Organization         Address  Phone   Notes  [place]  [phone]   [facility]    [phone]   [facility] [place], [place] [address] [phone]   [place] [address], [place] [phone]   Planned Parenthood    [phone]   [facility]    [phone]   [place] and [place] [address], [place] Phone:  [phone], Fax:  [phone] Hours of Operation:  9 am - 6 pm, M-F.  Also accepts Medicaid/Medicare and self-pay.  [facility] for [place], [address], [place] Phone: [phone], Fax: [phone]. Hours of Operation:  8:30 am - 5:30 pm, M-F.  Also accepts Medicaid and self-pay.  [facility] [address], [place] Phone: [phone]   [place], [place], [place] [phone], Ext. [phone] Mondays & Thursdays: 7-9 AM.  First 15 patients are seen on a first come, first serve basis.    [place] Providers:  Organization         Address  Phone   Notes  [facility] [address], [place] [phone] Also accepts self-pay patients.  [facility] [address], [place]  [phone]   [place], [address], [place] [phone]   [facility] [address], [place] [phone]   Lucianne Lei [address], [place]   [phone] Only accepts [place] Access [place] patients after they have their name applied to their card.   Self-Pay (no insurance) in [facility]:  Organization         Address  Phone   Notes  Sickle Cell Patients, [facility] [place] [phone]   [facility] [place] [phone]   [facility] [place]  [place], [place], [place] [phone]   [facility]/Dr. Osei-Bonsu  [address],  [place] or [address], [place] [phone] Phone number for both [place] and [place] locations is the same.  [facility] and [facility] [address] [place] [phone]   [facility] [address], [place] or [address] [phone]   [facility] [address] [place] [phone], phone; [phone], fax Sees patients 1st and 3rd Saturday of every month.  Must not qualify for public or private insurance (i.e. Medicaid, Medicare, [place] Health Choice, Veterans' Benefits)  Household income should be no more than 200% of the poverty level The clinic cannot treat you if you are pregnant or think you are pregnant  Sexually transmitted diseases are not treated at the  clinic.    Dental Care: Organization         Address  Phone  Notes  [facility] of [facility] [place] [phone] Accepts children up to age 36 who are enrolled in [place] or [place]; pregnant women with a Medicaid card; and children who have applied for Medicaid or [place] Health Choice, but were declined, whose parents can pay a reduced fee at time of service.  [facility] of [facility]  [address], [place] [phone] Accepts children up to age 43 who are enrolled in [place] or [place]; pregnant women with a Medicaid card; and children who have applied for Medicaid or [place] Health Choice, but were declined, whose parents can pay a reduced fee at time of service.  [place] Adult [facility] Access PROGRAM  [place] [phone] Patients are seen by appointment only. Walk-ins are not accepted. [place] will see patients 31 years of age and older. Monday - Tuesday (8am-5pm) Most Wednesdays (8:30-5pm) $30 per visit, cash only  [facility] Adult [facility] Access PRO[facility]  [address]  [address], [facility] [phone] Patients are seen by appointment only. Walk-ins are not accepted. [place] will see patients 61 years of age and older. One Wednesday Evening (Monthly: Volunteer Based).  $30 per visit, cash only  [place]  [phone] for adults; Children under age 1, call Graduate Pediatric Dentistry at [phone]. Children aged 13-14, please call [phone] to request a pediatric application.  Dental services are provided in all areas of dental care including fillings, crowns and bridges, complete and partial dentures, implants, gum treatment, root canals, and extractions. Preventive care is also provided. Treatment is provided to both adults and children. Patients are selected via a lottery and there is often a waiting list.   [facility] [address], [place]  [phone] [email or web address]   [facility] [address] [place], [place] [phone], Ext. [phone] Second and Fourth Thursday of each month, opens at 6:30 AM; Clinic ends at 9 AM.  Patients are seen on a first-come first-served basis, and a limited number are seen during each clinic.   [facility]  [address] Hillard Danker [place], [place] [phone]   Eligibility Requirements You must have lived in [place], [place], or [place] counties for at least the last three months.   You cannot be eligible for state or federal sponsored [employer], including [employer], [place], or [employer].   You generally cannot be eligible for healthcare insurance through your employer.    How to apply: Eligibility screenings are held every Tuesday and Wednesday afternoon from 1:00 pm until 4:00 pm. You do not need an appointment for the interview!  [facility] [address] [place], [place]   [place]  [place] Department  [place]  [phone]    Behavioral Health Resources in the Community: Intensive Outpatient Programs Organization         Address  Phone  Notes  [place]. [address], [place], [place] [phone]   [facility] [address] [place], [place]   ADS: [facility] [address] [place], [place]   [place]  [address]  [place], [place] or [phone]   Substance Abuse Resources Organization         Address  Phone  Notes  Alcohol and Drug Services  [phone]   [place]  [phone]   [facility] [place]   Chinita Pester  [phone]   Residential & Outpatient Substance Abuse Program  [phone]   Psychological Services Organization         Address  Phone  Notes  [facility] [place]  [place]  [phone]   [place] [address] [place] or [phone]    Mobile Crisis Teams Organization         Address  Phone  Notes  [facility], Mobile Crisis Care Unit  [phone]   [facility]  [address] [place], [place]   [place] [address], [place] [phone]    Self-Help/Support Groups Organization         Address  Phone             Notes  [place]. of [place] - variety of support groups  [place] Call for more information  Narcotics Anonymous (NA), [facility] [address], [employer] [place]  2 meetings at this location   Special educational needs teacher         Address  Phone  Notes  [facility] [place],    [place]  [phone]   [facility]  [address] [place] [address], [place], [place]   [place], [place]  [phone] Admissions: 8am-3pm M-F  Incentives Substance [place] [address]    [place], [place] [phone]   [facility] [address] [place], [place], [place]   [facility] [address]  [place], [place]   [facility] Programs - Intensive Outpatient [address], [place], [place]   [address] [facility] [facility] ([place].) [place].,  [place], [place] [phone] or [phone]   [facility] (RTS) [address], [place], [employer] Accepts Medicaid  [facility] [place] [address]  [place] [phone] Substance Abuse/Addiction Treatment   [facility] Organization         Address  Phone  Notes  [facility]  [phone]   Domenic Schwab, PhD [address] Arlis [place], [place]   [phone] or [phone]   [place] The [place], [place] [phone]   [facility] [address] [place], [place] [phone] Insurance/Medicaid/sponsorship through [facility] and Families [address], [address] [place]                                    [place], [place] [phone] [place] [address], [place] [phone]    Dr. Adele Schilder  [phone]   [facility] Dept. 1) [address], [place] 2) [place] 3)  [place], [place] [phone]  [phone]   [place] [phone] or [phone] (After Hours)

## 2013-12-30 NOTE — ED Notes (Signed)
Pt instructed to not leave hospital with IV in place; asked to see RN if needs to leave. "Pt reports she is not going anywhere."

## 2013-12-30 NOTE — ED Notes (Signed)
PT reports breast cancer in 1992; prothesis placed in 2005. Pt reports that last year she had similar incident of this last year; full work up; was chest wall pain. Pain is reproducable and worse with movement and inspiration. Denies SOB. Reports pain is sharp and radiates to back at times. OTC meds not helpful.

## 2014-01-02 NOTE — ED Provider Notes (Signed)
I performed an examination on the patient including cardiac, pulmonary, and gi systems which were unremarkable  Medical screening examination/treatment/procedure(s) were conducted as a shared visit with non-physician practitioner(s) and myself.  I personally evaluated the patient during the encounter.   EKG Interpretation   Date/Time:  Wednesday December 30 2013 12:55:42 EDT Ventricular Rate:  51 PR Interval:  120 QRS Duration: 84 QT Interval:  456 QTC Calculation: 420 R Axis:   22 Text Interpretation:  Sinus bradycardia Nonspecific ST and T wave  abnormality Abnormal ECG ED PHYSICIAN INTERPRETATION AVAILABLE IN [place]  [facility] Confirmed by TEST, Record ([id number]) on 01/01/2014 7:21:37 AM         Debby Freiberg, MD 01/02/14 [phone]

## 2014-01-05 ENCOUNTER — Other Ambulatory Visit: Payer: Self-pay | Admitting: Family Medicine

## 2014-01-05 DIAGNOSIS — Z1231 Encounter for screening mammogram for malignant neoplasm of breast: Secondary | ICD-10-CM

## 2014-01-21 ENCOUNTER — Ambulatory Visit
Admission: RE | Admit: 2014-01-21 | Discharge: 2014-01-21 | Disposition: A | Discharge status: Home / Self Care | Payer: BC Managed Care – PPO | Source: Ambulatory Visit | Attending: Family Medicine | Admitting: Family Medicine

## 2014-01-21 ENCOUNTER — Other Ambulatory Visit: Payer: Self-pay | Admitting: Family Medicine

## 2014-01-21 DIAGNOSIS — Z9012 Acquired absence of left breast and nipple: Secondary | ICD-10-CM

## 2014-01-21 DIAGNOSIS — Z1231 Encounter for screening mammogram for malignant neoplasm of breast: Secondary | ICD-10-CM

## 2014-01-25 ENCOUNTER — Other Ambulatory Visit: Payer: Self-pay | Admitting: Family Medicine

## 2014-01-25 DIAGNOSIS — Z9012 Acquired absence of left breast and nipple: Secondary | ICD-10-CM

## 2014-01-25 DIAGNOSIS — R922 Inconclusive mammogram: Secondary | ICD-10-CM

## 2014-01-25 DIAGNOSIS — N644 Mastodynia: Secondary | ICD-10-CM

## 2014-01-28 ENCOUNTER — Ambulatory Visit
Admission: RE | Admit: 2014-01-28 | Discharge: 2014-01-28 | Disposition: A | Discharge status: Home / Self Care | Payer: BC Managed Care – PPO | Source: Ambulatory Visit | Attending: Family Medicine | Admitting: Family Medicine

## 2014-01-28 DIAGNOSIS — N644 Mastodynia: Secondary | ICD-10-CM

## 2014-01-28 DIAGNOSIS — R922 Inconclusive mammogram: Secondary | ICD-10-CM

## 2014-01-28 DIAGNOSIS — Z9012 Acquired absence of left breast and nipple: Secondary | ICD-10-CM

## 2014-02-19 ENCOUNTER — Other Ambulatory Visit: Payer: Self-pay | Admitting: Family Medicine

## 2014-02-19 ENCOUNTER — Ambulatory Visit
Admission: RE | Admit: 2014-02-19 | Discharge: 2014-02-19 | Disposition: A | Discharge status: Home / Self Care | Payer: BC Managed Care – PPO | Source: Ambulatory Visit | Attending: Family Medicine | Admitting: Family Medicine

## 2014-02-19 DIAGNOSIS — R0789 Other chest pain: Secondary | ICD-10-CM

## 2014-03-18 ENCOUNTER — Other Ambulatory Visit: Payer: Self-pay | Admitting: Family Medicine

## 2014-03-18 ENCOUNTER — Other Ambulatory Visit (HOSPITAL_COMMUNITY): Payer: Self-pay | Admitting: Family Medicine

## 2014-03-18 DIAGNOSIS — R079 Chest pain, unspecified: Secondary | ICD-10-CM

## 2014-03-30 ENCOUNTER — Ambulatory Visit (HOSPITAL_COMMUNITY): Admission: RE | Admit: 2014-03-30 | Payer: BC Managed Care – PPO | Source: Ambulatory Visit

## 2014-04-13 ENCOUNTER — Ambulatory Visit
Admission: RE | Admit: 2014-04-13 | Discharge: 2014-04-13 | Disposition: A | Discharge status: Home / Self Care | Payer: BC Managed Care – PPO | Source: Ambulatory Visit | Attending: Family Medicine | Admitting: Family Medicine

## 2014-04-13 DIAGNOSIS — R079 Chest pain, unspecified: Secondary | ICD-10-CM

## 2014-04-13 MED ORDER — GADOBENATE DIMEGLUMINE 529 MG/ML IV SOLN
20.0000 mL | Freq: Once | INTRAVENOUS | Status: AC | PRN
  Administered 2014-04-13: 20 mL via INTRAVENOUS

## 2014-04-15 ENCOUNTER — Encounter (HOSPITAL_COMMUNITY): Payer: Self-pay | Admitting: Cardiovascular Disease

## 2014-06-04 ENCOUNTER — Encounter: Payer: Self-pay | Admitting: Family Medicine

## 2014-06-04 ENCOUNTER — Telehealth: Payer: Self-pay | Admitting: *Deleted

## 2014-06-04 ENCOUNTER — Ambulatory Visit (INDEPENDENT_AMBULATORY_CARE_PROVIDER_SITE_OTHER): Payer: 59 | Admitting: Family Medicine

## 2014-06-04 VITALS — BP 112/82 | HR 80 | Temp 98.6°F | Resp 16 | Ht 65.5 in | Wt 279.2 lb

## 2014-06-04 DIAGNOSIS — M940 Chondrocostal junction syndrome [Tietze]: Secondary | ICD-10-CM

## 2014-06-04 DIAGNOSIS — K760 Fatty (change of) liver, not elsewhere classified: Secondary | ICD-10-CM

## 2014-06-04 DIAGNOSIS — R072 Precordial pain: Secondary | ICD-10-CM

## 2014-06-04 DIAGNOSIS — M3390 Dermatopolymyositis, unspecified, organ involvement unspecified: Secondary | ICD-10-CM

## 2014-06-04 DIAGNOSIS — Z853 Personal history of malignant neoplasm of breast: Secondary | ICD-10-CM

## 2014-06-04 DIAGNOSIS — K8020 Calculus of gallbladder without cholecystitis without obstruction: Secondary | ICD-10-CM

## 2014-06-04 DIAGNOSIS — E669 Obesity, unspecified: Secondary | ICD-10-CM

## 2014-06-04 DIAGNOSIS — K219 Gastro-esophageal reflux disease without esophagitis: Secondary | ICD-10-CM

## 2014-06-04 DIAGNOSIS — E559 Vitamin D deficiency, unspecified: Secondary | ICD-10-CM

## 2014-06-04 DIAGNOSIS — R1010 Upper abdominal pain, unspecified: Secondary | ICD-10-CM

## 2014-06-04 DIAGNOSIS — I10 Essential (primary) hypertension: Secondary | ICD-10-CM

## 2014-06-04 LAB — COMPREHENSIVE METABOLIC PANEL
AST: 16 U/L (ref 0–37)
Albumin: 4 g/dL (ref 3.5–5.2)
Alkaline Phosphatase: 86 U/L (ref 39–117)
BUN: 14 mg/dL (ref 6–23)
CALCIUM: 9.7 mg/dL (ref 8.4–10.5)
CHLORIDE: 100 meq/L (ref 96–112)
CO2: 28 meq/L (ref 19–32)
Creat: 0.71 mg/dL (ref 0.50–1.10)
Glucose, Bld: 82 mg/dL (ref 70–99)
Potassium: 3.7 mEq/L (ref 3.5–5.3)
Sodium: 140 mEq/L (ref 135–145)
TOTAL PROTEIN: 7.7 g/dL (ref 6.0–8.3)
Total Bilirubin: 0.6 mg/dL (ref 0.2–1.2)

## 2014-06-04 LAB — LIPID PANEL
CHOLESTEROL: 206 mg/dL — AB (ref 0–200)
HDL: 49 mg/dL (ref >39)
LDL Cholesterol: 125 mg/dL — ABNORMAL HIGH (ref 0–99)
Total CHOL/HDL Ratio: 4.2 Ratio
Triglycerides: 161 mg/dL — ABNORMAL HIGH (ref <150)
VLDL: 32 mg/dL (ref 0–40)

## 2014-06-04 LAB — CBC WITH DIFFERENTIAL/PLATELET
BASOS ABS: 0 10*3/uL (ref 0.0–0.1)
Basophils Relative: 1 % (ref 0–1)
EOS ABS: 0.2 10*3/uL (ref 0.0–0.7)
EOS PCT: 5 % (ref 0–5)
HCT: 40.1 % (ref 36.0–46.0)
Hemoglobin: 13.3 g/dL (ref 12.0–15.0)
Lymphocytes Relative: 41 % (ref 12–46)
Lymphs Abs: 1.9 10*3/uL (ref 0.7–4.0)
MCH: 28.2 pg (ref 26.0–34.0)
MCHC: 33.2 g/dL (ref 30.0–36.0)
MCV: 85.1 fL (ref 78.0–100.0)
MPV: 9.9 fL (ref 8.6–12.4)
Monocytes Absolute: 0.4 10*3/uL (ref 0.1–1.0)
Monocytes Relative: 8 % (ref 3–12)
Neutro Abs: 2.1 10*3/uL (ref 1.7–7.7)
Neutrophils Relative %: 45 % (ref 43–77)
Platelets: 264 10*3/uL (ref 150–400)
RBC: 4.71 MIL/uL (ref 3.87–5.11)
RDW: 13.9 % (ref 11.5–15.5)
WBC: 4.6 10*3/uL (ref 4.0–10.5)

## 2014-06-04 LAB — CK: Total CK: 124 U/L (ref 7–177)

## 2014-06-04 LAB — C-REACTIVE PROTEIN: CRP: 1.1 mg/dL — ABNORMAL HIGH (ref <0.60)

## 2014-06-04 MED ORDER — MELOXICAM 15 MG PO TABS: 7.5000 mg | ORAL_TABLET | Freq: Every day | ORAL | Status: DC

## 2014-06-04 MED ORDER — VERAPAMIL HCL 120 MG PO TABS: 120.0000 mg | ORAL_TABLET | Freq: Two times a day (BID) | ORAL | Status: DC

## 2014-06-04 MED ORDER — LISINOPRIL-HYDROCHLOROTHIAZIDE 20-25 MG PO TABS: 1.0000 | ORAL_TABLET | Freq: Every morning | ORAL | Status: DC

## 2014-06-04 MED ORDER — OMEPRAZOLE 40 MG PO CPDR: 40.0000 mg | DELAYED_RELEASE_CAPSULE | Freq: Every day | ORAL | Status: DC

## 2014-06-04 NOTE — Progress Notes (Addendum)
Subjective:  This chart was scribed for Delman Cheadle, MD by Donato Schultz, Medical Scribe. This patient was seen in Room 25 and the patient's care was started at 12:01 PM.   Patient ID: Gail Schmitt, female    DOB: 13-Sep-1960, 54 y.o.   MRN: [id number]  HPI   Chief Complaint  Patient presents with  . Establish Care    former patient of Dr Raul Del at [facility]   HPI Comments: EMERLY PRAK is a 54 y.o. female who [facility] care.  She had her mammogram done 6 weeks ago.  She is fasting today.    She stopped taking Prednisone a year ago.  She started losing weight after she discontinued the Prednisone.  She stopped taking Diclofenac after she experienced diarrhea.  She does not check her blood pressure at home and does not have a blood pressure cuff.    She is complaining of chest pain that radiates throughout her flanks bilaterally and radiates down her arms bilaterally that started 9 months ago.  She had an MRI done of her chest which did not reveal any abnormalities.  Laying on her sides and lifting objects aggravate the pain.  Twisting her trunk does not reproduce her pain.  She tried applying heat and a topical muscle relaxer to the area with no relief to her symptoms.  She lists flatus as an associated symptom which is aggravated when she eats anything.  She lists cough as an associated symptom.  She is unable to sleep due to pain and takes OTC sleep medication in order to sleep.  She denies nausea and vomiting as associated symptoms.  When she wakes up in the morning she has more mucous in her mouth than normal and has a metallic taste in her mouth.    She is also complaining of incoordination when she closes her eyes that started a few months ago.  She denies lightheadedness as an associated symptom.  Past Medical History  Diagnosis Date  . Hypertension   . Breast cancer, left breast     a. s/p left mastectomy, 1999  . Unstable  angina     a. Cath ~ 10 + yrs ago, [employer], [place] - "normal but pressures were high."  . Obesity   . Dermatomyositis     a. noted prior to diagnosis of Breast CA ("i'm allergic to cancer")   Past Surgical History  Procedure Laterality Date  . Laparoscopic total hysterectomy    . Mastectomy      left side, 10 years ago  . Breast surgery    . Left heart catheterization with coronary angiogram N/A 07/13/2011    Procedure: LEFT HEART CATHETERIZATION WITH CORONARY ANGIOGRAM;  Surgeon: Josue Hector, MD;  [facility];  Service: Cardiovascular;  Laterality: N/A;   Family History  Problem Relation Age of Onset  . Diabetes type II Mother     alive @ 82   History   Social History  . Marital Status: Divorced    Spouse Name: N/A    Number of Children: N/A  . Years of Education: N/A   Occupational History  . Not on file.   Social History Main Topics  . Smoking status: Never Smoker   . Smokeless tobacco: Never Used  . Alcohol Use: Yes     Comment: Rare drink  . Drug Use: No  . Sexual Activity: Not on file   Other Topics Concern  .  Not on file   Social History Narrative   Lives @ home in [place] with 1 of her 3 dtrs.  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  Doesn't follow any particular diet.   Allergies  Allergen Reactions  . Percocet [Oxycodone-Acetaminophen] Itching and Rash     Review of Systems  Respiratory: Positive for cough.   Cardiovascular: Positive for chest pain.  Gastrointestinal: Negative for nausea and vomiting.  Neurological: Positive for dizziness. Negative for light-headedness.  Psychiatric/Behavioral: Positive for sleep disturbance.  All other systems reviewed and are negative.   Objective:  BP 112/82 mmHg  Pulse 80  Temp(Src) 98.6 F (37 C) (Oral)  Resp 16  Ht 5' 5.5" (1.664 m)  Wt 279 lb 3.2 oz (126.644 kg)  BMI 45.74 kg/m2  SpO2 96%  Physical Exam  Constitutional: She is  oriented to person, place, and time. She appears well-developed and well-nourished.  HENT:  Head: Normocephalic and atraumatic.  Eyes: EOM are normal.  Neck: Normal range of motion. Neck supple. No thyromegaly present.  Cardiovascular: Normal rate, regular rhythm, S1 normal, S2 normal and normal heart sounds.  Exam reveals no gallop and no friction rub.   No murmur heard. Pulmonary/Chest: Effort normal and breath sounds normal. No respiratory distress. She has no wheezes. She has no rales. She exhibits no mass.  Lungs clear to auscultation bilaterally.  Good air movement.  Tenderness in the lowest margin of the anterior rib cage almost near the RUQ of the abdomen.  Pain over the left anterior and lateral margins of the rib cage.  Musculoskeletal: Normal range of motion.  Lymphadenopathy:    She has no cervical adenopathy.  Neurological: She is alert and oriented to person, place, and time.  Skin: Skin is warm and dry.  Psychiatric: She has a normal mood and affect. Her behavior is normal.  Nursing note and vitals reviewed.   Assessment & Plan:   Precordial pain/Costochondritis - Plan: TSH, Vit D  25 hydroxy (rtn osteoporosis monitoring), Ambulatory referral to Physical Therapy - suspect costochondritis but also likely complicated by GERD - chronic so rec trial of PT. Start tiral of qhs tylenol pm  Essential hypertension - Plan: Lipid panel, TSH  Obesity - Plan: Lipid panel, Comprehensive metabolic panel  Personal history of malignant neoplasm of breast  Dermatomyositis - Plan: Sedimentation Rate, C-reactive protein, CK  From breast cancer - now resolved.  Pain of upper abdomen - Plan: US Abdomen Complete, Comprehensive metabolic panel - rec [place] to eval for cholelithiasis. Pt advised to find previous colonoscopy report to see when she is due for repeat - likely about now.  Gastroesophageal reflux disease, esophagitis presence not specified - Plan: H. pylori breath test, CBC with  Differential/Platelet - start ppi. Reviewed diet changes, req raising head of bed w/ bricks. Avoid nsaids.  HPL - work on diet/exercise - ASCVD risk 3.6% so no statin needed  Vit D def - high dose once weekly x 6 mos, then otc qd.  Cholelithiasis - refer to surgery to discuss options for cholecystectomy  Fatty liver - reassuring that lfts are normal - work on diet/exercise.    Meds ordered this encounter  Medications  . omeprazole (PRILOSEC) 40 MG capsule    Sig: Take 1 capsule (40 mg total) by mouth daily.    Dispense:  30 capsule    Refill:  3  . meloxicam (MOBIC) 15 MG tablet    Sig:  Take 0.5-1 tablets (7.5-15 mg total) by mouth daily.    Dispense:  30 tablet    Refill:  1  . verapamil (CALAN) 120 MG tablet    Sig: Take 1 tablet (120 mg total) by mouth 2 (two) times daily.    Dispense:  180 tablet    Refill:  1  . lisinopril-hydrochlorothiazide (PRINZIDE,ZESTORETIC) 20-25 MG per tablet    Sig: Take 1 tablet by mouth every morning.    Dispense:  90 tablet    Refill:  1    I personally performed the services described in this documentation, which was scribed in my presence. The recorded information has been reviewed and considered, and addended by me as needed.  Delman Cheadle, MD MPH

## 2014-06-04 NOTE — Telephone Encounter (Signed)
Faxed signed medical records release to [facility]. Confirmation page received.

## 2014-06-04 NOTE — Patient Instructions (Addendum)
We will get blood work to look for alternative causes, check for gallstones with an ultrasound, and start treating you for acid reflux (see diet guidelines) as well as for chest wall pain from costochondritis.  Lets get started on a physical therapy referral for the next 1-2 months from now.  Please try putting a brick under each side of the head of your bed for a gentle incline.  I would recommend taking 1-2 tabs of tylenol pm each evening - this is not addictive nor will you develop any tolerance or dependence.  Other than that, no over-the-counter medications.  Lets recheck in 1-2 months - but come back sooner if getting worse at all so we can take the next steps.  Have you had a colonoscopy for screening in the past 5 years? If not, let me know so we can refer you for this.  Costochondritis Costochondritis, sometimes called Tietze syndrome, is a swelling and irritation (inflammation) of the tissue (cartilage) that connects your ribs with your breastbone (sternum). It causes pain in the chest and rib area. Costochondritis usually goes away on its own over time. It can take up to 6 weeks or longer to get better, especially if you are unable to limit your activities. CAUSES  Some cases of costochondritis have no known cause. Possible causes include:  Injury (trauma).  Exercise or activity such as lifting.  Severe coughing. SIGNS AND SYMPTOMS  Pain and tenderness in the chest and rib area.  Pain that gets worse when coughing or taking deep breaths.  Pain that gets worse with specific movements. DIAGNOSIS  Your health care provider will do a physical exam and ask about your symptoms. Chest X-rays or other tests may be done to rule out other problems. TREATMENT  Costochondritis usually goes away on its own over time. Your health care provider may prescribe medicine to help relieve pain. HOME CARE INSTRUCTIONS   Avoid exhausting physical activity. Try not to strain your ribs during normal  activity. This would include any activities using chest, abdominal, and side muscles, especially if heavy weights are used.  Apply ice to the affected area for the first 2 days after the pain begins.  Put ice in a plastic bag.  Place a towel between your skin and the bag.  Leave the ice on for 20 minutes, 2-3 times a day.  Only take over-the-counter or prescription medicines as directed by your health care provider. SEEK MEDICAL CARE IF:  You have redness or swelling at the rib joints. These are signs of infection.  Your pain does not go away despite rest or medicine. SEEK IMMEDIATE MEDICAL CARE IF:   Your pain increases or you are very uncomfortable.  You have shortness of breath or difficulty breathing.  You cough up blood.  You have worse chest pains, sweating, or vomiting.  You have a fever or persistent symptoms for more than 2-3 days.  You have a fever and your symptoms suddenly get worse. MAKE SURE YOU:   Understand these instructions.  Will watch your condition.  Will get help right away if you are not doing well or get worse. Document Released: 01/31/2005 Document Revised: 02/11/2013 Document Reviewed: 11/25/2012 [facility] Patient Information 2015 [place], [place]. This information is not intended to replace advice given to you by your health care provider. Make sure you discuss any questions you have with your health care provider. Chest Wall Pain Chest wall pain is pain in or around the bones and muscles of your chest.  It may take up to 6 weeks to get better. It may take longer if you must stay physically active in your work and activities.  CAUSES  Chest wall pain may happen on its own. However, it may be caused by:  A viral illness like the flu.  Injury.  Coughing.  Exercise.  Arthritis.  Fibromyalgia.  Shingles. HOME CARE INSTRUCTIONS   Avoid overtiring physical activity. Try not to strain or perform activities that cause pain. This includes any  activities using your chest or your abdominal and side muscles, especially if heavy weights are used.  Put ice on the sore area.  Put ice in a plastic bag.  Place a towel between your skin and the bag.  Leave the ice on for 15-20 minutes per hour while awake for the first 2 days.  Only take over-the-counter or prescription medicines for pain, discomfort, or fever as directed by your caregiver. SEEK IMMEDIATE MEDICAL CARE IF:   Your pain increases, or you are very uncomfortable.  You have a fever.  Your chest pain becomes worse.  You have new, unexplained symptoms.  You have nausea or vomiting.  You feel sweaty or lightheaded.  You have a cough with phlegm (sputum), or you cough up blood. MAKE SURE YOU:   Understand these instructions.  Will watch your condition.  Will get help right away if you are not doing well or get worse. Document Released: 04/23/2005 Document Revised: 07/16/2011 Document Reviewed: 12/18/2010 [facility] Patient Information 2015 [place], [place]. This information is not intended to replace advice given to you by your health care provider. Make sure you discuss any questions you have with your health care provider. Food Choices for Gastroesophageal Reflux Disease When you have gastroesophageal reflux disease (GERD), the foods you eat and your eating habits are very important. Choosing the right foods can help ease the discomfort of GERD. WHAT GENERAL GUIDELINES DO I NEED TO FOLLOW?  Choose fruits, vegetables, whole grains, low-fat dairy products, and low-fat meat, fish, and poultry.  Limit fats such as oils, salad dressings, butter, nuts, and avocado.  Keep a food diary to identify foods that cause symptoms.  Avoid foods that cause reflux. These may be different for different people.  Eat frequent small meals instead of three large meals each day.  Eat your meals slowly, in a relaxed setting.  Limit fried foods.  Cook foods using methods other  than frying.  Avoid drinking alcohol.  Avoid drinking large amounts of liquids with your meals.  Avoid bending over or lying down until 2-3 hours after eating. WHAT FOODS ARE NOT RECOMMENDED? The following are some foods and drinks that may worsen your symptoms: Vegetables Tomatoes. Tomato juice. Tomato and spaghetti sauce. Chili peppers. Onion and garlic. Horseradish. Fruits Oranges, grapefruit, and lemon (fruit and juice). Meats High-fat meats, fish, and poultry. This includes hot dogs, ribs, ham, sausage, salami, and bacon. Dairy Whole milk and chocolate milk. Sour cream. Cream. Butter. Ice cream. Cream cheese.  Beverages Coffee and tea, with or without caffeine. Carbonated beverages or energy drinks. Condiments Hot sauce. Barbecue sauce.  Sweets/Desserts Chocolate and cocoa. Donuts. Peppermint and spearmint. Fats and Oils High-fat foods, including [place] fries and potato chips. Other Vinegar. Strong spices, such as black pepper, white pepper, red pepper, cayenne, curry powder, cloves, ginger, and chili powder. The items listed above may not be a complete list of foods and beverages to avoid. Contact your dietitian for more information. Document Released: 04/23/2005 Document Revised: 04/28/2013 Document Reviewed: 02/25/2013  [employer] Patient Information 2015 [place]. This information is not intended to replace advice given to you by your health care provider. Make sure you discuss any questions you have with your health care provider. Gastroesophageal Reflux Disease, Adult Gastroesophageal reflux disease (GERD) happens when acid from your stomach flows up into the esophagus. When acid comes in contact with the esophagus, the acid causes soreness (inflammation) in the esophagus. Over time, GERD may create small holes (ulcers) in the lining of the esophagus. CAUSES   Increased body weight. This puts pressure on the stomach, making acid rise from the stomach into the  esophagus.  Smoking. This increases acid production in the stomach.  Drinking alcohol. This causes decreased pressure in the lower esophageal sphincter (valve or ring of muscle between the esophagus and stomach), allowing acid from the stomach into the esophagus.  Late evening meals and a full stomach. This increases pressure and acid production in the stomach.  A malformed lower esophageal sphincter. Sometimes, no cause is found. SYMPTOMS   Burning pain in the lower part of the mid-chest behind the breastbone and in the mid-stomach area. This may occur twice a week or more often.  Trouble swallowing.  Sore throat.  Dry cough.  Asthma-like symptoms including chest tightness, shortness of breath, or wheezing. DIAGNOSIS  Your caregiver may be able to diagnose GERD based on your symptoms. In some cases, X-rays and other tests may be done to check for complications or to check the condition of your stomach and esophagus. TREATMENT  Your caregiver may recommend over-the-counter or prescription medicines to help decrease acid production. Ask your caregiver before starting or adding any new medicines.  HOME CARE INSTRUCTIONS   Change the factors that you can control. Ask your caregiver for guidance concerning weight loss, quitting smoking, and alcohol consumption.  Avoid foods and drinks that make your symptoms worse, such as:  Caffeine or alcoholic drinks.  Chocolate.  Peppermint or mint flavorings.  Garlic and onions.  Spicy foods.  Citrus fruits, such as oranges, lemons, or limes.  Tomato-based foods such as sauce, chili, salsa, and pizza.  Fried and fatty foods.  Avoid lying down for the 3 hours prior to your bedtime or prior to taking a nap.  Eat small, frequent meals instead of large meals.  Wear loose-fitting clothing. Do not wear anything tight around your waist that causes pressure on your stomach.  Raise the head of your bed 6 to 8 inches with wood blocks to  help you sleep. Extra pillows will not help.  Only take over-the-counter or prescription medicines for pain, discomfort, or fever as directed by your caregiver.  Do not take aspirin, ibuprofen, or other nonsteroidal anti-inflammatory drugs (NSAIDs). SEEK IMMEDIATE MEDICAL CARE IF:   You have pain in your arms, neck, jaw, teeth, or back.  Your pain increases or changes in intensity or duration.  You develop nausea, vomiting, or sweating (diaphoresis).  You develop shortness of breath, or you faint.  Your vomit is green, yellow, black, or looks like coffee grounds or blood.  Your stool is red, bloody, or black. These symptoms could be signs of other problems, such as heart disease, gastric bleeding, or esophageal bleeding. MAKE SURE YOU:   Understand these instructions.  Will watch your condition.  Will get help right away if you are not doing well or get worse. Document Released: 01/31/2005 Document Revised: 07/16/2011 Document Reviewed: 11/10/2010 [facility] Patient Information 2015 [place], [place]. This information is not intended to replace advice given  to you by your health care provider. Make sure you discuss any questions you have with your health care provider.  

## 2014-06-05 LAB — SEDIMENTATION RATE: SED RATE: 28 mm/h — AB (ref 0–22)

## 2014-06-05 LAB — VITAMIN D 25 HYDROXY (VIT D DEFICIENCY, FRACTURES): VIT D 25 HYDROXY: 12 ng/mL — AB (ref 30–100)

## 2014-06-05 LAB — TSH: TSH: 0.797 u[IU]/mL (ref 0.350–4.500)

## 2014-06-07 LAB — H. PYLORI BREATH TEST: H. PYLORI BREATH TEST: NOT DETECTED

## 2014-06-16 ENCOUNTER — Other Ambulatory Visit: Payer: Self-pay

## 2014-06-23 ENCOUNTER — Ambulatory Visit
Admission: RE | Admit: 2014-06-23 | Discharge: 2014-06-23 | Disposition: A | Discharge status: Home / Self Care | Payer: 59 | Source: Ambulatory Visit | Attending: Family Medicine | Admitting: Family Medicine

## 2014-06-23 DIAGNOSIS — E559 Vitamin D deficiency, unspecified: Secondary | ICD-10-CM | POA: Insufficient documentation

## 2014-06-23 DIAGNOSIS — R1010 Upper abdominal pain, unspecified: Secondary | ICD-10-CM

## 2014-06-23 DIAGNOSIS — K760 Fatty (change of) liver, not elsewhere classified: Secondary | ICD-10-CM | POA: Insufficient documentation

## 2014-06-23 MED ORDER — ERGOCALCIFEROL 1.25 MG (50000 UT) PO CAPS: 50000.0000 [IU] | ORAL_CAPSULE | ORAL | Status: DC

## 2014-06-23 NOTE — Addendum Note (Signed)
Addended by: Delman Cheadle on: 06/23/2014 10:57 AM   Modules accepted: Orders

## 2014-07-04 ENCOUNTER — Ambulatory Visit (INDEPENDENT_AMBULATORY_CARE_PROVIDER_SITE_OTHER): Payer: 59 | Admitting: Physician Assistant

## 2014-07-04 VITALS — BP 132/86 | HR 74 | Temp 98.5°F | Resp 18 | Ht 67.75 in | Wt 279.0 lb

## 2014-07-04 DIAGNOSIS — K8020 Calculus of gallbladder without cholecystitis without obstruction: Secondary | ICD-10-CM

## 2014-07-04 DIAGNOSIS — R1011 Right upper quadrant pain: Secondary | ICD-10-CM

## 2014-07-04 DIAGNOSIS — R110 Nausea: Secondary | ICD-10-CM

## 2014-07-04 LAB — COMPLETE METABOLIC PANEL WITH GFR
AST: 12 U/L (ref 0–37)
Albumin: 3.8 g/dL (ref 3.5–5.2)
Alkaline Phosphatase: 79 U/L (ref 39–117)
BUN: 15 mg/dL (ref 6–23)
CO2: 27 meq/L (ref 19–32)
CREATININE: 0.7 mg/dL (ref 0.50–1.10)
Calcium: 9.2 mg/dL (ref 8.4–10.5)
Chloride: 102 mEq/L (ref 96–112)
GFR, Est Non African American: >89 mL/min
Glucose, Bld: 95 mg/dL (ref 70–99)
POTASSIUM: 3.7 meq/L (ref 3.5–5.3)
SODIUM: 140 meq/L (ref 135–145)
TOTAL PROTEIN: 7.3 g/dL (ref 6.0–8.3)
Total Bilirubin: 0.6 mg/dL (ref 0.2–1.2)

## 2014-07-04 LAB — POCT CBC
Granulocyte percent: 45.1 %G (ref 37–80)
HEMATOCRIT: 39.9 % (ref 37.7–47.9)
HEMOGLOBIN: 12.6 g/dL (ref 12.2–16.2)
Lymph, poc: 1.9 (ref 0.6–3.4)
MCH: 28 pg (ref 27–31.2)
MCHC: 31.7 g/dL — AB (ref 31.8–35.4)
MCV: 88.1 fL (ref 80–97)
MID (CBC): 0.3 (ref 0–0.9)
MPV: 6.4 fL (ref 0–99.8)
POC Granulocyte: 1.8 — AB (ref 2–6.9)
POC LYMPH %: 46.6 % (ref 10–50)
POC MID %: 8.3 %M (ref 0–12)
Platelet Count, POC: 244 10*3/uL (ref 142–424)
RBC: 4.53 M/uL (ref 4.04–5.48)
RDW, POC: 13.6 %
WBC: 4.1 10*3/uL — AB (ref 4.6–10.2)

## 2014-07-04 LAB — LIPASE: Lipase: <10 U/L (ref 0–75)

## 2014-07-04 MED ORDER — ONDANSETRON 4 MG PO TBDP: 4.0000 mg | ORAL_TABLET | Freq: Three times a day (TID) | ORAL | Status: DC | PRN

## 2014-07-04 MED ORDER — ONDANSETRON 4 MG PO TBDP
4.0000 mg | ORAL_TABLET | Freq: Once | ORAL | Status: AC
  Administered 2014-07-04: 4 mg via ORAL

## 2014-07-04 MED ORDER — TRAMADOL HCL 50 MG PO TABS: 50.0000 mg | ORAL_TABLET | Freq: Three times a day (TID) | ORAL | Status: DC | PRN

## 2014-07-04 NOTE — Patient Instructions (Addendum)
  [facility] appt with dr Lady Gary 07/06/14 at 1030

## 2014-07-04 NOTE — Progress Notes (Signed)
   Subjective:    Patient ID: Gail Schmitt, female    DOB: 1961-02-13, 54 y.o.   MRN: [id number]  HPI Pt presents to clinic with increased upper abdominal pain over the last 24 hours.  She has been having problems with upper abd pain for several weeks and she is waiting to see the surgeon.  2 days ago she ate a biscuit and had increased right upper abd pain that lasted for several hours.  Last night she ate chinese food (nothing fried) and had 3 episodes of watery diarrhea and nausea - she feels like she would feel better if she would throw up.  She has had nothing to eat since last pm because she feels so poorly.  Review of Systems  Constitutional: Positive for chills. Negative for fever.  Gastrointestinal: Positive for nausea and diarrhea. Negative for vomiting.       Objective:   Physical Exam  Constitutional: She is oriented to person, place, and time. She appears well-developed and well-nourished.  BP 132/86 mmHg  Pulse 74  Temp(Src) 98.5 F (36.9 C) (Oral)  Resp 18  Ht 5' 7.75" (1.721 m)  Wt 279 lb (126.554 kg)  BMI 42.73 kg/m2  SpO2 97%   HENT:  Head: Normocephalic and atraumatic.  Right Ear: External ear normal.  Left Ear: External ear normal.  Neck: Normal range of motion.  Cardiovascular: Normal rate, regular rhythm and normal heart sounds.   Pulmonary/Chest: Effort normal.  Abdominal: Soft. Normal appearance and bowel sounds are normal. There is tenderness (RUQ>LUQ ) in the right upper quadrant, epigastric area and left upper quadrant. There is guarding and positive Murphy's sign. There is no rigidity.  Neurological: She is alert and oriented to person, place, and time.  Skin: Skin is warm and dry.  Psychiatric: She has a normal mood and affect. Her behavior is normal. Judgment and thought content normal.      Assessment & Plan:  Nausea without vomiting - Plan: ondansetron (ZOFRAN-ODT) disintegrating tablet 4 mg, ondansetron (ZOFRAN ODT) 4 MG disintegrating  tablet  Abdominal pain, right upper quadrant - Plan: POCT CBC, Lipase, COMPLETE METABOLIC PANEL WITH GFR, traMADol (ULTRAM) 50 MG tablet  Gallstones - known by [place] - she has appt on Tuesday with the surgeon  We are going to do symptomatic pain treatment and low fat and bland foods until her appt then - if she develops fever she will RTC here before her appt.  She understands and agrees with the plan.     Windell Hummingbird PA-C  [facility] 07/04/2014 1:38 PM

## 2014-07-06 DIAGNOSIS — K8020 Calculus of gallbladder without cholecystitis without obstruction: Secondary | ICD-10-CM | POA: Insufficient documentation

## 2014-07-07 ENCOUNTER — Telehealth (HOSPITAL_COMMUNITY): Payer: Self-pay | Admitting: Cardiovascular Disease

## 2014-07-07 NOTE — Telephone Encounter (Signed)
Received records from [facility] for appointment with Dr Gwenlyn Found on 07/30/14.  Records given to [facility] (medical records) for Dr Kennon Holter schedule on 07/30/14.

## 2014-07-28 ENCOUNTER — Inpatient Hospital Stay (HOSPITAL_COMMUNITY): Payer: 59

## 2014-07-28 ENCOUNTER — Encounter (HOSPITAL_COMMUNITY): Payer: Self-pay | Admitting: Emergency Medicine

## 2014-07-28 ENCOUNTER — Observation Stay (HOSPITAL_COMMUNITY)
Admission: EM | Admit: 2014-07-28 | Discharge: 2014-08-01 | DRG: 418 | Disposition: A | Discharge status: Home / Self Care | Payer: 59 | Attending: Surgery | Admitting: Surgery

## 2014-07-28 DIAGNOSIS — R0789 Other chest pain: Secondary | ICD-10-CM | POA: Insufficient documentation

## 2014-07-28 DIAGNOSIS — Z79899 Other long term (current) drug therapy: Secondary | ICD-10-CM | POA: Diagnosis not present

## 2014-07-28 DIAGNOSIS — K8050 Calculus of bile duct without cholangitis or cholecystitis without obstruction: Secondary | ICD-10-CM | POA: Diagnosis not present

## 2014-07-28 DIAGNOSIS — I10 Essential (primary) hypertension: Secondary | ICD-10-CM | POA: Diagnosis not present

## 2014-07-28 DIAGNOSIS — K819 Cholecystitis, unspecified: Secondary | ICD-10-CM

## 2014-07-28 DIAGNOSIS — Z853 Personal history of malignant neoplasm of breast: Secondary | ICD-10-CM | POA: Insufficient documentation

## 2014-07-28 DIAGNOSIS — Z6841 Body Mass Index (BMI) 40.0 and over, adult: Secondary | ICD-10-CM | POA: Insufficient documentation

## 2014-07-28 DIAGNOSIS — R079 Chest pain, unspecified: Secondary | ICD-10-CM | POA: Diagnosis present

## 2014-07-28 DIAGNOSIS — Z9012 Acquired absence of left breast and nipple: Secondary | ICD-10-CM | POA: Diagnosis not present

## 2014-07-28 DIAGNOSIS — Z01818 Encounter for other preprocedural examination: Secondary | ICD-10-CM | POA: Insufficient documentation

## 2014-07-28 DIAGNOSIS — R9439 Abnormal result of other cardiovascular function study: Secondary | ICD-10-CM | POA: Insufficient documentation

## 2014-07-28 DIAGNOSIS — R072 Precordial pain: Secondary | ICD-10-CM

## 2014-07-28 DIAGNOSIS — K8000 Calculus of gallbladder with acute cholecystitis without obstruction: Secondary | ICD-10-CM | POA: Diagnosis not present

## 2014-07-28 HISTORY — DX: Headache, unspecified: R51.9

## 2014-07-28 HISTORY — DX: Nonrheumatic mitral (valve) prolapse: I34.1

## 2014-07-28 HISTORY — DX: Pneumonia, unspecified organism: J18.9

## 2014-07-28 HISTORY — DX: Other pulmonary embolism without acute cor pulmonale: I26.99

## 2014-07-28 HISTORY — DX: Headache: R51

## 2014-07-28 HISTORY — DX: Malignant neoplasm of uterus, part unspecified: C55

## 2014-07-28 HISTORY — DX: Unspecified osteoarthritis, unspecified site: M19.90

## 2014-07-28 HISTORY — DX: Family history of other specified conditions: Z84.89

## 2014-07-28 LAB — CBC
HEMATOCRIT: 38.3 % (ref 36.0–46.0)
Hemoglobin: 12.2 g/dL (ref 12.0–15.0)
MCH: 28 pg (ref 26.0–34.0)
MCHC: 31.9 g/dL (ref 30.0–36.0)
MCV: 87.8 fL (ref 78.0–100.0)
Platelets: 200 10*3/uL (ref 150–400)
RBC: 4.36 MIL/uL (ref 3.87–5.11)
RDW: 13.4 % (ref 11.5–15.5)
WBC: 4.3 10*3/uL (ref 4.0–10.5)

## 2014-07-28 LAB — I-STAT TROPONIN, ED: Troponin i, poc: 0 ng/mL (ref 0.00–0.08)

## 2014-07-28 LAB — CREATININE, SERUM
CREATININE: 0.81 mg/dL (ref 0.50–1.10)
GFR calc Af Amer: >90 mL/min (ref >90)
GFR calc non Af Amer: 80 mL/min — ABNORMAL LOW (ref >90)

## 2014-07-28 LAB — COMPREHENSIVE METABOLIC PANEL
ALK PHOS: 84 U/L (ref 39–117)
ALT: 12 U/L (ref 0–35)
AST: 21 U/L (ref 0–37)
Albumin: 3.5 g/dL (ref 3.5–5.2)
Anion gap: 9 (ref 5–15)
BILIRUBIN TOTAL: 0.7 mg/dL (ref 0.3–1.2)
BUN: 8 mg/dL (ref 6–23)
CHLORIDE: 105 mmol/L (ref 96–112)
CO2: 26 mmol/L (ref 19–32)
Calcium: 9.3 mg/dL (ref 8.4–10.5)
Creatinine, Ser: 0.76 mg/dL (ref 0.50–1.10)
GFR calc Af Amer: >90 mL/min (ref >90)
Glucose, Bld: 104 mg/dL — ABNORMAL HIGH (ref 70–99)
POTASSIUM: 3.4 mmol/L — AB (ref 3.5–5.1)
Sodium: 140 mmol/L (ref 135–145)
Total Protein: 7.5 g/dL (ref 6.0–8.3)

## 2014-07-28 LAB — LIPASE, BLOOD: Lipase: 17 U/L (ref 11–59)

## 2014-07-28 LAB — CBC WITH DIFFERENTIAL/PLATELET
BASOS ABS: 0 10*3/uL (ref 0.0–0.1)
BASOS PCT: 1 % (ref 0–1)
EOS PCT: 10 % — AB (ref 0–5)
Eosinophils Absolute: 0.4 10*3/uL (ref 0.0–0.7)
HEMATOCRIT: 39.5 % (ref 36.0–46.0)
Hemoglobin: 12.8 g/dL (ref 12.0–15.0)
Lymphocytes Relative: 43 % (ref 12–46)
Lymphs Abs: 1.7 10*3/uL (ref 0.7–4.0)
MCH: 28.3 pg (ref 26.0–34.0)
MCHC: 32.4 g/dL (ref 30.0–36.0)
MCV: 87.2 fL (ref 78.0–100.0)
MONO ABS: 0.4 10*3/uL (ref 0.1–1.0)
Monocytes Relative: 9 % (ref 3–12)
Neutro Abs: 1.5 10*3/uL — ABNORMAL LOW (ref 1.7–7.7)
Neutrophils Relative %: 37 % — ABNORMAL LOW (ref 43–77)
PLATELETS: 220 10*3/uL (ref 150–400)
RBC: 4.53 MIL/uL (ref 3.87–5.11)
RDW: 13.4 % (ref 11.5–15.5)
WBC: 4 10*3/uL (ref 4.0–10.5)

## 2014-07-28 MED ORDER — LISINOPRIL-HYDROCHLOROTHIAZIDE 20-25 MG PO TABS: 1.0000 | ORAL_TABLET | Freq: Every morning | ORAL | Status: DC

## 2014-07-28 MED ORDER — ONDANSETRON HCL 4 MG/2ML IJ SOLN
4.0000 mg | Freq: Four times a day (QID) | INTRAMUSCULAR | Status: DC | PRN
  Filled 2014-07-28: qty 2

## 2014-07-28 MED ORDER — HEPARIN SODIUM (PORCINE) 5000 UNIT/ML IJ SOLN
5000.0000 [IU] | Freq: Three times a day (TID) | INTRAMUSCULAR | Status: DC
  Administered 2014-07-28 – 2014-07-31 (×7): 5000 [IU] via SUBCUTANEOUS
  Filled 2014-07-28 (×6): qty 1

## 2014-07-28 MED ORDER — DIPHENHYDRAMINE HCL 12.5 MG/5ML PO ELIX
12.5000 mg | ORAL_SOLUTION | Freq: Four times a day (QID) | ORAL | Status: DC | PRN
Start: 2014-07-28 — End: 2014-08-01

## 2014-07-28 MED ORDER — MORPHINE SULFATE 2 MG/ML IJ SOLN
1.0000 mg | INTRAMUSCULAR | Status: DC | PRN
  Administered 2014-07-28 – 2014-07-30 (×7): 2 mg via INTRAVENOUS
  Filled 2014-07-28 (×7): qty 1

## 2014-07-28 MED ORDER — HYDROCHLOROTHIAZIDE 25 MG PO TABS
25.0000 mg | ORAL_TABLET | Freq: Every day | ORAL | Status: DC
  Administered 2014-07-29 – 2014-08-01 (×4): 25 mg via ORAL
  Filled 2014-07-28 (×4): qty 1

## 2014-07-28 MED ORDER — MORPHINE SULFATE 4 MG/ML IJ SOLN
4.0000 mg | Freq: Once | INTRAMUSCULAR | Status: AC
  Administered 2014-07-28: 4 mg via INTRAVENOUS
  Filled 2014-07-28: qty 1

## 2014-07-28 MED ORDER — ACETAMINOPHEN 325 MG PO TABS: 650.0000 mg | ORAL_TABLET | Freq: Four times a day (QID) | ORAL | Status: DC | PRN

## 2014-07-28 MED ORDER — POLYETHYLENE GLYCOL 3350 17 G PO PACK: 17.0000 g | PACK | Freq: Every day | ORAL | Status: DC | PRN

## 2014-07-28 MED ORDER — DEXTROSE 5 % IV SOLN
2.0000 g | INTRAVENOUS | Status: DC
  Administered 2014-07-28 – 2014-07-31 (×4): 2 g via INTRAVENOUS
  Filled 2014-07-28 (×6): qty 2

## 2014-07-28 MED ORDER — BOOST / RESOURCE BREEZE PO LIQD
1.0000 | Freq: Three times a day (TID) | ORAL | Status: DC
  Administered 2014-07-28 – 2014-07-30 (×5): 1 via ORAL

## 2014-07-28 MED ORDER — TECHNETIUM TC 99M SESTAMIBI GENERIC - CARDIOLITE
30.0000 | Freq: Once | INTRAVENOUS | Status: AC | PRN
  Administered 2014-07-28: 30 via INTRAVENOUS

## 2014-07-28 MED ORDER — ONDANSETRON HCL 4 MG/2ML IJ SOLN
4.0000 mg | Freq: Once | INTRAMUSCULAR | Status: AC
  Administered 2014-07-28: 4 mg via INTRAVENOUS
  Filled 2014-07-28: qty 2

## 2014-07-28 MED ORDER — SODIUM CHLORIDE 0.9 % IV BOLUS (SEPSIS)
1000.0000 mL | Freq: Once | INTRAVENOUS | Status: AC
  Administered 2014-07-28: 1000 mL via INTRAVENOUS

## 2014-07-28 MED ORDER — LISINOPRIL 20 MG PO TABS
20.0000 mg | ORAL_TABLET | Freq: Every day | ORAL | Status: DC
  Administered 2014-07-29 – 2014-08-01 (×4): 20 mg via ORAL
  Filled 2014-07-28 (×5): qty 1

## 2014-07-28 MED ORDER — MORPHINE SULFATE 4 MG/ML IJ SOLN
4.0000 mg | Freq: Once | INTRAMUSCULAR | Status: AC
Start: 2014-07-28 — End: 2014-07-28
  Administered 2014-07-28: 4 mg via INTRAVENOUS
  Filled 2014-07-28: qty 1

## 2014-07-28 MED ORDER — ACETAMINOPHEN 650 MG RE SUPP: 650.0000 mg | Freq: Four times a day (QID) | RECTAL | Status: DC | PRN

## 2014-07-28 MED ORDER — POTASSIUM CHLORIDE IN NACL 20-0.9 MEQ/L-% IV SOLN
INTRAVENOUS | Status: DC
  Administered 2014-07-28 – 2014-07-29 (×3): via INTRAVENOUS
  Administered 2014-07-31: 1 mL via INTRAVENOUS
  Filled 2014-07-28 (×13): qty 1000

## 2014-07-28 MED ORDER — VERAPAMIL HCL 120 MG PO TABS
120.0000 mg | ORAL_TABLET | Freq: Two times a day (BID) | ORAL | Status: DC
  Administered 2014-07-29 – 2014-08-01 (×6): 120 mg via ORAL
  Filled 2014-07-28 (×12): qty 1

## 2014-07-28 MED ORDER — DIPHENHYDRAMINE HCL 50 MG/ML IJ SOLN: 12.5000 mg | Freq: Four times a day (QID) | INTRAMUSCULAR | Status: DC | PRN

## 2014-07-28 MED ORDER — PANTOPRAZOLE SODIUM 40 MG PO TBEC
40.0000 mg | DELAYED_RELEASE_TABLET | Freq: Every day | ORAL | Status: DC
  Administered 2014-07-28 – 2014-08-01 (×4): 40 mg via ORAL
  Filled 2014-07-28 (×3): qty 1

## 2014-07-28 NOTE — ED Provider Notes (Signed)
CSN: [id number]     Arrival date & time 07/28/14  0707 History   First MD Initiated Contact with Patient 07/28/14 0708     No chief complaint on file.    (Consider location/radiation/quality/duration/timing/severity/associated sxs/prior Treatment) HPI Comments: Patient is a 54 year old female with past medical history of hypertension, breast cancer with mastectomy and reconstruction. She was recently diagnosed with gallstones and was referred to general surgery to discuss cholecystectomy. General surgery was reluctant to operate due to the patient reporting having occasional chest pain over the past year. She is due to see a cardiologist on Friday for preoperative clearance. She presents today with a worsening of her pain.   Patient is a 54 y.o. female presenting with abdominal pain. The history is provided by the patient.  Abdominal Pain Pain location:  Epigastric and RUQ Pain quality: cramping   Pain radiates to:  Does not radiate Pain severity:  Moderate Onset quality:  Gradual Duration: Several months. Timing:  Constant Progression:  Worsening Chronicity:  New Relieved by:  Nothing Worsened by:  Movement, palpation and eating Ineffective treatments:  None tried Associated symptoms: nausea   Associated symptoms: no constipation and no fever     Past Medical History  Diagnosis Date  . Hypertension   . Breast cancer, left breast     a. s/p left mastectomy, 1999  . Unstable angina     a. Cath ~ 10 + yrs ago, [facility], [place] - "normal but pressures were high."  . Obesity   . Dermatomyositis     a. noted prior to diagnosis of Breast CA ("i'm allergic to cancer")  . Heart murmur    Past Surgical History  Procedure Laterality Date  . Laparoscopic total hysterectomy    . Mastectomy      left side, 10 years ago  . Breast surgery    . Left heart catheterization with coronary angiogram N/A 07/13/2011    Procedure: LEFT HEART CATHETERIZATION WITH CORONARY ANGIOGRAM;  Surgeon:  Josue Hector, MD;  Location: [facility] CATH LAB;  Service: Cardiovascular;  Laterality: N/A;  . Hernia repair    . Abdominal hysterectomy     Family History  Problem Relation Age of Onset  . Diabetes type II Mother     alive @ 62  . Diabetes Sister   . Stroke Brother    History  Substance Use Topics  . Smoking status: Never Smoker   . Smokeless tobacco: Never Used  . Alcohol Use: 0.6 oz/week    1 Standard drinks or equivalent per week     Comment: Rare drink   OB History    No data available     Review of Systems  Constitutional: Negative for fever.  Gastrointestinal: Positive for nausea and abdominal pain. Negative for constipation.  All other systems reviewed and are negative.     Allergies  Percocet  Home Medications   Prior to Admission medications   Medication Sig Start Date End Date Taking? Authorizing Provider  calcium carbonate (OS-CAL - DOSED IN MG OF ELEMENTAL CALCIUM) 1250 MG tablet Take 1 tablet by mouth daily.    Historical Provider, MD  ergocalciferol (VITAMIN D2) 50000 UNITS capsule Take 1 capsule (50,000 Units total) by mouth once a week. 06/23/14   Shawnee Knapp, MD  lisinopril-hydrochlorothiazide (PRINZIDE,ZESTORETIC) 20-25 MG per tablet Take 1 tablet by mouth every morning. 06/04/14   Shawnee Knapp, MD  meloxicam (MOBIC) 15 MG tablet Take 0.5-1 tablets (7.5-15 mg total) by mouth daily. 06/04/14  Shawnee Knapp, MD  omeprazole (PRILOSEC) 40 MG capsule Take 1 capsule (40 mg total) by mouth daily. 06/04/14   Shawnee Knapp, MD  ondansetron (ZOFRAN ODT) 4 MG disintegrating tablet Take 1 tablet (4 mg total) by mouth every 8 (eight) hours as needed for nausea. 07/04/14   Mancel Bale, PA-C  traMADol (ULTRAM) 50 MG tablet Take 1 tablet (50 mg total) by mouth every 8 (eight) hours as needed. 07/04/14   Mancel Bale, PA-C  verapamil (CALAN) 120 MG tablet Take 1 tablet (120 mg total) by mouth 2 (two) times daily. 06/04/14   Shawnee Knapp, MD   BP 112/59 mmHg  Pulse 81  Temp(Src) 98.5  F (36.9 C) (Oral)  Resp 18  SpO2 99% Physical Exam  Constitutional: She is oriented to person, place, and time. She appears well-developed and well-nourished. No distress.  HENT:  Head: Normocephalic and atraumatic.  Neck: Normal range of motion. Neck supple.  Cardiovascular: Normal rate and regular rhythm.  Exam reveals no gallop and no friction rub.   No murmur heard. Pulmonary/Chest: Effort normal and breath sounds normal. No respiratory distress. She has no wheezes.  Abdominal: Soft. Bowel sounds are normal. She exhibits no distension. There is tenderness. There is no rebound and no guarding.  There is tenderness to palpation in the epigastrium and right upper quadrant.  Musculoskeletal: Normal range of motion.  Neurological: She is alert and oriented to person, place, and time.  Skin: Skin is warm and dry. She is not diaphoretic.  Nursing note and vitals reviewed.   ED Course  Procedures (including critical care time) Labs Review Labs Reviewed  COMPREHENSIVE METABOLIC PANEL  CBC WITH DIFFERENTIAL/PLATELET  LIPASE, BLOOD    Imaging Review No results found.   EKG Interpretation   Date/Time:  Wednesday July 28 2014 07:29:13 EDT Ventricular Rate:  68 PR Interval:    QRS Duration: 95 QT Interval:  435 QTC Calculation: 463 R Axis:   38 Text Interpretation:  Junctional rhythm Nonspecific repol abnormality,  diffuse leads Baseline wander in lead(s) II III aVR aVL aVF Confirmed by  DELOS  MD, Breyson Kelm ([id number]) on 07/28/2014 7:40:44 AM      MDM   Final diagnoses:  None    Patient is a 54 year old female with suspected biliary colic and known cholelithiasis. She presents with increasing pain. Her laboratory studies are unremarkable with no elevation of white count, normal LFTs and lipase, and no fever. Due to the intractable, persistent nature of her discomfort I have consulted general surgery who will likely admit the patient. They will want her evaluated by cardiology  as she has been experiencing discomfort in her chest with exertion over the past year. They will make this consult while she is in the hospital.    Veryl Speak, MD 07/28/14 1014

## 2014-07-28 NOTE — ED Notes (Addendum)
Pt presents to ED for evaluation of abdominal pain that pt has had evaluated by PCP. Back in January was told she was having issues with her gallbladder after having an US done. Pt was told by surgeon that he will not remove her gallbladder until pt has her evaluation of chest pain by a cardiologist (chest pain has been going on for one year, pressure that radiates into left arm and upper left neck and back). However, pt states her pain is getting unbearable and she couldn't wait to see the cardiologist this Friday so she is seeking evaluation for gallbladder issue today. VSS. Alert and Oriented.

## 2014-07-28 NOTE — ED Notes (Signed)
Pt monitored by pulse ox, bp cuff, and 12-lead. 

## 2014-07-28 NOTE — Consult Note (Signed)
Patient ID: Gail Schmitt MRN: [id number] DOB/AGE: 1961-04-18 54 y.o.  Admit date: 07/28/2014 Referring Physician: CCS Primary Cardiologist: Burt Knack Reason for Consultation: Chest pain  HPI: 54 yo female with history of HTN, obesity and breast cancer who presents to the ED today with complaints of RUQ abdominal pain and chest pain. She has been found to have gallstones and is being admitted by the general surgery team with plans for cholecystectomy. She has had symptoms consistent with biliary colic for one year. During the evaluation by the surgical team, she also described left sided chest pain with radiation to her left shoulder/arm. She has no documented CAD. She was seen in consultation at [facility] in 2013 by Dr. Sherren Mocha with a similar presentation of chest pain. Cardiac cath 07/13/11 with no evidence of CAD. Echo 07/13/11 with mild LVH, normal LV systolic function, no valve disease. She has never been a smoker.   She tells me that she has had exertional left sided/epigastric pain for 3-4 years. The pain does not occur every day. It does occur at least once per week. The pain is sharp and shoots through to her back. There is often associated pain in her head, neck and left arm. No associated dyspnea, nausea or diaphoresis. Each episode lasts for several seconds.    Past Medical History  Diagnosis Date  . Hypertension   . Breast cancer, left breast     a. s/p left mastectomy, 1999  . Unstable angina     a. Cath ~ 10 + yrs ago, [facility], [place] - "normal but pressures were high."  . Obesity   . Dermatomyositis     a. noted prior to diagnosis of Breast CA ("i'm allergic to cancer")  . Heart murmur     Family History  Problem Relation Age of Onset  . Diabetes type II Mother     alive @ 64  . Diabetes Sister   . Stroke Brother     History   Social History  . Marital Status: Divorced    Spouse Name: N/A  . Number of Children: N/A  . Years of Education: N/A   Occupational  History  . Not on file.   Social History Main Topics  . Smoking status: Never Smoker   . Smokeless tobacco: Never Used  . Alcohol Use: 0.6 oz/week    1 Standard drinks or equivalent per week     Comment: Rare drink  . Drug Use: No  . Sexual Activity: Not on file   Other Topics Concern  . Not on file   Social History Narrative   Lives @ home in [place] with 1 of her 3 dtrs.  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  Doesn't follow any particular diet.    Past Surgical History  Procedure Laterality Date  . Laparoscopic total hysterectomy    . Mastectomy      left side, 10 years ago  . Breast surgery    . Left heart catheterization with coronary angiogram N/A 07/13/2011    Procedure: LEFT HEART CATHETERIZATION WITH CORONARY ANGIOGRAM;  Surgeon: Josue Hector, MD;  Location: [facility] CATH LAB;  Service: Cardiovascular;  Laterality: N/A;  . Hernia repair    . Abdominal hysterectomy      Allergies  Allergen Reactions  . Percocet [Oxycodone-Acetaminophen] Itching and Rash    Prior to Admission medications   Medication Sig Start Date End Date Taking?  Authorizing Provider  ergocalciferol (VITAMIN D2) 50000 UNITS capsule Take 1 capsule (50,000 Units total) by mouth once a week. 06/23/14  Yes Shawnee Knapp, MD  lisinopril-hydrochlorothiazide (PRINZIDE,ZESTORETIC) 20-25 MG per tablet Take 1 tablet by mouth every morning. 06/04/14  Yes Shawnee Knapp, MD  meloxicam (MOBIC) 15 MG tablet Take 0.5-1 tablets (7.5-15 mg total) by mouth daily. 06/04/14  Yes Shawnee Knapp, MD  omeprazole (PRILOSEC) 40 MG capsule Take 1 capsule (40 mg total) by mouth daily. 06/04/14  Yes Shawnee Knapp, MD  ondansetron (ZOFRAN ODT) 4 MG disintegrating tablet Take 1 tablet (4 mg total) by mouth every 8 (eight) hours as needed for nausea. 07/04/14  Yes Mancel Bale, PA-C  verapamil (CALAN) 120 MG tablet Take 1 tablet (120 mg total) by mouth 2 (two) times daily. 06/04/14  Yes Shawnee Knapp, MD      Review of systems complete and found to be negative unless listed above    Physical Exam: Blood pressure 116/56, pulse 46, temperature 98.5 F (36.9 C), temperature source Oral, resp. rate 18, SpO2 96 %.    General: Well developed, well nourished, NAD  HEENT: OP clear, mucus membranes moist  SKIN: warm, dry. No rashes.  Neuro: No focal deficits  Musculoskeletal: Muscle strength 5/5 all ext  Psychiatric: Mood and affect normal  Neck: No JVD, no carotid bruits, no thyromegaly, no lymphadenopathy.  Lungs:Clear bilaterally, no wheezes, rhonci, crackles  Cardiovascular: Regular rate and rhythm. No murmurs, gallops or rubs.  Abdomen:Soft. Bowel sounds present. TTP diffusely Extremities: No lower extremity edema. Pulses are 2 + in the bilateral DP/PT.   Labs:   Lab Results  Component Value Date   WBC 4.0 07/28/2014   HGB 12.8 07/28/2014   HCT 39.5 07/28/2014   MCV 87.2 07/28/2014   PLT 220 07/28/2014     Recent Labs Lab 07/28/14 0738  NA 140  K 3.4*  CL 105  CO2 26  BUN 8  CREATININE 0.76  CALCIUM 9.3  PROT 7.5  BILITOT 0.7  ALKPHOS 84  ALT 12  AST 21  GLUCOSE 104*   Lab Results  Component Value Date   CKTOTAL 124 06/04/2014   CKMB 1.6 07/13/2011   TROPONINI <0.30 07/13/2011    Echo 07/13/11: Left ventricle: Wall thickness was increased in a pattern of mild LVH.  Systolic function was normal. The estimated ejection fraction was in the range of 55% to 60%.  Cardiac cath 07/13/11: Coronary Arteries: Left dominant with no anomalies LM: Normal LAD: Normal D1: Normal Circumflex: Normal OM!: High take off large artery bifurcating normal OM2: Normal AV groove normal RCA: nondominant normal. No true PDA but distal circumflex and OM system much larger Ventriculography: EF: 55%, NSVT during injection Hemodynamics: Aortic Pressure: 142 87 mmHg LV Pressure: 138 11 mmHg  EKG:  No clear p-waves. Appears to be junctional. Non-specific T wave abnormalities. Baseline wander.   Tele: sinus with clear p waves  ASSESSMENT AND PLAN:   1. Chest pain: Her chest pain is atypical and is not likely cardiac related. She is known to have normal coronary arteries by cardiac cath in March of 2103. She is very concerned about her pain, especially given the exertional component. I will arrange an exercise stress myoview for am of 07/29/14 to exclude ischemia. Will arrange echo today to assess LVEF and exclude structural heart disease. We will follow along and make further recommendations regarding risk for surgery after the testing is complete. NPO at midnight tonight.  2. HTN: BP controlled.   3. Abdominal pain felt to be secondary to biliary colic/gallstones: Per surgical team.    Signed: Lauree Chandler, MD 07/28/2014, 11:52 AM

## 2014-07-28 NOTE — ED Notes (Signed)
Pt returned from bathroom. Urine specimen at bedside. PT monitored by pulse ox, bp cuff, and 12-lead.

## 2014-07-28 NOTE — ED Notes (Signed)
Pt states "I knew something was wrong when I couldn't do my job at work, I was too tired and weak and had chills."

## 2014-07-28 NOTE — H&P (Signed)
Gail Schmitt 09-10-1960  [id number].   Primary Care MD: Dr. Marijean Heath Chief Complaint/Reason for Consult: biliary coli HPI: this is a 54 yo morbidly obese black female who has been having biliary colic for the last year.  She was diagnosed with gallstones and was sent to our office.  She saw Dr. Rosendo Gros who felt she would benefit from a cholecystectomy, but the patient admitted to have exertional chest pain that radiates her to left arm and jaw with pressure in her central chest.  She had a normal cardiac cath 2 years ago, but has not been followed by cards.  Cardiac clearance was request.  She was supposed to be seen this Friday, but starting having worsening persistent epigastric RUQ abdominal pain and presented to [facility].  Her labs are normal and her troponin was normal.  No further imaging has been completed at this time.  We have been asked to see her for admission.  ROS : Please see HPI, otherwise negative.  She does sleep on multiple pillow, but this is for comfort not for SOB.  She does get mild SOB with exertion.  Family History  Problem Relation Age of Onset  . Diabetes type II Mother     alive @ 64  . Diabetes Sister   . Stroke Brother     Past Medical History  Diagnosis Date  . Hypertension   . Breast cancer, left breast     a. s/p left mastectomy, 1999  . Unstable angina     a. Cath ~ 10 + yrs ago, [facility], [place] - "normal but pressures were high."  . Obesity   . Dermatomyositis     a. noted prior to diagnosis of Breast CA ("i'm allergic to cancer")  . Heart murmur     Past Surgical History  Procedure Laterality Date  . Laparoscopic total hysterectomy    . Mastectomy      left side, 10 years ago  . Breast surgery    . Left heart catheterization with coronary angiogram N/A 07/13/2011    Procedure: LEFT HEART CATHETERIZATION WITH CORONARY ANGIOGRAM;  Surgeon: Josue Hector, MD;  Location: [facility] CATH LAB;  Service: Cardiovascular;  Laterality: N/A;  . Hernia repair    .  Abdominal hysterectomy      Social History:  reports that she has never smoked. She has never used smokeless tobacco. She reports that she drinks about 0.6 oz of alcohol per week. She reports that she does not use illicit drugs.  Allergies:  Allergies  Allergen Reactions  . Percocet [Oxycodone-Acetaminophen] Itching and Rash     (Not in a hospital admission)  Blood pressure 120/78, pulse 43, temperature 98.5 F (36.9 C), temperature source Oral, resp. rate 18, SpO2 99 %. Physical Exam: General: pleasant, obese black female who is laying in bed in NAD HEENT: head is normocephalic, atraumatic.  Sclera are noninjected.  PERRL.  Ears and nose without any masses or lesions.  Mouth is pink and moist Heart: regular, rate, and rhythm.  Normal s1,s2. No obvious murmurs, gallops, or rubs noted.  Palpable radial and pedal pulses bilaterally Lungs: CTAB, no wheezes, rhonchi, or rales noted.  Respiratory effort nonlabored Abd: soft, mildly tender in RUQ, ND, +BS, no masses, hernias, or organomegaly MS: all 4 extremities are symmetrical with no cyanosis, clubbing, or edema. Skin: warm and dry with no masses, lesions, or rashes Psych: A&Ox3 with an appropriate affect.    Results for orders placed or performed during the hospital  encounter of 07/28/14 (from the past 48 hour(s))  Comprehensive metabolic panel     Status: Abnormal   Collection Time: 07/28/14  7:38 AM  Result Value Ref Range   Sodium 140 135 - 145 mmol/L   Potassium 3.4 (L) 3.5 - 5.1 mmol/L   Chloride 105 96 - 112 mmol/L   CO2 26 19 - 32 mmol/L   Glucose, Bld 104 (H) 70 - 99 mg/dL   BUN 8 6 - 23 mg/dL   Creatinine, Ser 0.76 0.50 - 1.10 mg/dL   Calcium 9.3 8.4 - 10.5 mg/dL   Total Protein 7.5 6.0 - 8.3 g/dL   Albumin 3.5 3.5 - 5.2 g/dL   AST 21 0 - 37 U/L   ALT 12 0 - 35 U/L   Alkaline Phosphatase 84 39 - 117 U/L   Total Bilirubin 0.7 0.3 - 1.2 mg/dL   GFR calc non Af Amer >90 >90 mL/min   GFR calc Af Amer >90 >90 mL/min     Comment: (NOTE) The eGFR has been calculated using the CKD EPI equation. This calculation has not been validated in all clinical situations. eGFR's persistently <90 mL/min signify possible Chronic Kidney Disease.    Anion gap 9 5 - 15  CBC with Differential     Status: Abnormal   Collection Time: 07/28/14  7:38 AM  Result Value Ref Range   WBC 4.0 4.0 - 10.5 K/uL   RBC 4.53 3.87 - 5.11 MIL/uL   Hemoglobin 12.8 12.0 - 15.0 g/dL   HCT 39.5 36.0 - 46.0 %   MCV 87.2 78.0 - 100.0 fL   MCH 28.3 26.0 - 34.0 pg   MCHC 32.4 30.0 - 36.0 g/dL   RDW 13.4 11.5 - 15.5 %   Platelets 220 150 - 400 K/uL   Neutrophils Relative % 37 (L) 43 - 77 %   Neutro Abs 1.5 (L) 1.7 - 7.7 K/uL   Lymphocytes Relative 43 12 - 46 %   Lymphs Abs 1.7 0.7 - 4.0 K/uL   Monocytes Relative 9 3 - 12 %   Monocytes Absolute 0.4 0.1 - 1.0 K/uL   Eosinophils Relative 10 (H) 0 - 5 %   Eosinophils Absolute 0.4 0.0 - 0.7 K/uL   Basophils Relative 1 0 - 1 %   Basophils Absolute 0.0 0.0 - 0.1 K/uL  Lipase, blood     Status: None   Collection Time: 07/28/14  7:38 AM  Result Value Ref Range   Lipase 17 11 - 59 U/L  I-stat troponin, ED     Status: None   Collection Time: 07/28/14  8:03 AM  Result Value Ref Range   Troponin i, poc 0.00 0.00 - 0.08 ng/mL   Comment 3            Comment: Due to the release kinetics of cTnI, a negative result within the first hours of the onset of symptoms does not rule out myocardial infarction with certainty. If myocardial infarction is still suspected, repeat the test at appropriate intervals.    No results found.     Assessment/Plan 1. Biliary colic -admit for pain control -clear liquids, IVFs -because of pain, will put on Rocephin incase she has early cholecystitis, but suspect this is more likely biliary colic given her normal labs 2. Chest pain with exertion -patient has chest pain/presure with exertion with pain going to her left arm and jaw.  I have consulted cardiology  for evaluation and clearance prior to any type of  surgical intervention. -PCXR for evaluation and preoperative clearance -EKG done and in chart 3. DVT prophylaxis -Heparin/SCDs 4. HTN -resume home meds  Shatasia Cutshaw,Heitman E 07/28/2014, 10:42 AM Pager: [phone]

## 2014-07-29 ENCOUNTER — Inpatient Hospital Stay (HOSPITAL_COMMUNITY): Payer: 59

## 2014-07-29 DIAGNOSIS — R072 Precordial pain: Secondary | ICD-10-CM | POA: Diagnosis not present

## 2014-07-29 DIAGNOSIS — R079 Chest pain, unspecified: Secondary | ICD-10-CM

## 2014-07-29 MED ORDER — TECHNETIUM TC 99M SESTAMIBI GENERIC - CARDIOLITE
30.0000 | Freq: Once | INTRAVENOUS | Status: AC | PRN
  Administered 2014-07-29: 30 via INTRAVENOUS

## 2014-07-29 MED ORDER — SODIUM CHLORIDE 0.9 % IV SOLN: 250.0000 mL | INTRAVENOUS | Status: DC | PRN

## 2014-07-29 MED ORDER — SODIUM CHLORIDE 0.9 % IJ SOLN: 3.0000 mL | INTRAMUSCULAR | Status: DC | PRN

## 2014-07-29 MED ORDER — SODIUM CHLORIDE 0.9 % IV SOLN
INTRAVENOUS | Status: DC
  Administered 2014-07-30 – 2014-07-31 (×2): via INTRAVENOUS

## 2014-07-29 MED ORDER — REGADENOSON 0.4 MG/5ML IV SOLN
INTRAVENOUS | Status: AC
  Filled 2014-07-29: qty 5

## 2014-07-29 MED ORDER — ASPIRIN 81 MG PO CHEW
81.0000 mg | CHEWABLE_TABLET | ORAL | Status: AC
  Administered 2014-07-30: 81 mg via ORAL
  Filled 2014-07-29: qty 1

## 2014-07-29 MED ORDER — REGADENOSON 0.4 MG/5ML IV SOLN
0.4000 mg | Freq: Once | INTRAVENOUS | Status: DC
  Filled 2014-07-29: qty 5

## 2014-07-29 MED ORDER — SODIUM CHLORIDE 0.9 % IJ SOLN
3.0000 mL | Freq: Two times a day (BID) | INTRAMUSCULAR | Status: DC
  Administered 2014-07-30: 3 mL via INTRAVENOUS

## 2014-07-29 NOTE — Progress Notes (Signed)
Nutrition Brief Note  Patient identified on the Malnutrition Screening Tool (MST) Report  Wt Readings from Last 15 Encounters:  07/28/14 273 lb 2.4 oz (123.9 kg)  07/04/14 279 lb (126.554 kg)  06/04/14 279 lb 3.2 oz (126.644 kg)  07/12/11 289 lb 7.4 oz (131.3 kg)   this is a 54 yo morbidly obese black female who has been having biliary colic for the last year. She was diagnosed with gallstones and was sent to our office. She saw Dr. Rosendo Gros who felt she would benefit from a cholecystectomy, but the patient admitted to have exertional chest pain that radiates her to left arm and jaw with pressure in her central chest.   Pt was out of room at time of multiple visits. She is currently NPO- she is awaiting cardiac clearance prior to undergoing cholecystectomy. Yesterday she was on a clear liquid diet and tolerated well (85%).   Wt changes are not significant for time frame.   Body mass index is 45.45 kg/(m^2). Patient meets criteria for extreme obesity, class III based on current BMI.  Current diet order is NPO, patient is consuming approximately n/a% of meals at this time. Labs and medications reviewed.   No nutrition interventions warranted at this time. If nutrition issues arise, please consult RD.   Moiz Ryant A. Jimmye Norman, RD, LDN, CDE Pager: [phone] After hours Pager: [phone]

## 2014-07-29 NOTE — Progress Notes (Signed)
Patient Profile: 54 yo female with history of HTN, obesity and breast cancer who presents to the ED 3/24 with complaints of RUQ abdominal pain and chest pain. She has been found to have gallstones and was admitted by the general surgery team with plans for cholecystectomy. During the evaluation by the surgical team, she also described left sided chest pain with radiation to her left shoulder/arm. She has no documented CAD. She was seen in consultation at [facility] in 2013 by Dr. Sherren Mocha with a similar presentation of chest pain. Cardiac cath 07/13/11 with no evidence of CAD. Echo 07/13/11 with mild LVH, normal LV systolic function, no valve disease. Pre-operative w/u with NST recommended.   Subjective: No complaints. Had one recurrent episode of CP earlier this am. She tolerated Exercise Myoview well. She exercised beyond her target HR of 140. Max HR reached was 160.   Objective: Vital signs in last 24 hours: Temp:  [97.7 F (36.5 C)-98.3 F (36.8 C)] 98.3 F (36.8 C) (03/24 0631) Pulse Rate:  [56-166] 100 (03/24 1008) Resp:  [15-18] 15 (03/24 0631) BP: (111-142)/(48-103) 138/49 mmHg (03/24 1000) SpO2:  [99 %-100 %] 100 % (03/24 0631) Weight:  [273 lb 2.4 oz (123.9 kg)] 273 lb 2.4 oz (123.9 kg) (03/23 1217) Last BM Date: 07/28/14  Intake/Output from previous day: 03/23 0701 - 03/24 0700 In: 1704 [P.O.:240; I.V.:1464] Out: 6 [Urine:6] Intake/Output this shift:    Medications Current Facility-Administered Medications  Medication Dose Route Frequency Provider Last Rate Last Dose  . 0.9 % NaCl with KCl 20 mEq/ L  infusion   Intravenous Continuous Saverio Danker, PA-C 100 mL/hr at 07/29/14 0044    . acetaminophen (TYLENOL) tablet 650 mg  650 mg Oral Q6H PRN Saverio Danker, PA-C       Or  . acetaminophen (TYLENOL) suppository 650 mg  650 mg Rectal Q6H PRN Saverio Danker, PA-C      . cefTRIAXone (ROCEPHIN) 2 g in dextrose 5 % 50 mL IVPB  2 g Intravenous Q24H Saverio Danker, PA-C 100 mL/hr at  07/28/14 1114 2 g at 07/28/14 1114  . diphenhydrAMINE (BENADRYL) injection 12.5-25 mg  12.5-25 mg Intravenous Q6H PRN Saverio Danker, PA-C       Or  . diphenhydrAMINE (BENADRYL) 12.5 MG/5ML elixir 12.5-25 mg  12.5-25 mg Oral Q6H PRN Saverio Danker, PA-C      . feeding supplement (RESOURCE BREEZE) (RESOURCE BREEZE) liquid 1 Container  1 Container Oral TID BM Md Ccs, MD   1 Container at 07/28/14 2123  . heparin injection 5,000 Units  5,000 Units Subcutaneous 3 times per day Saverio Danker, PA-C   5,000 Units at 07/29/14 0546  . lisinopril (PRINIVIL,ZESTRIL) tablet 20 mg  20 mg Oral Daily Md Ccs, MD       And  . hydrochlorothiazide (HYDRODIURIL) tablet 25 mg  25 mg Oral Daily Md Ccs, MD      . morphine 2 MG/ML injection 1-4 mg  1-4 mg Intravenous Q2H PRN Saverio Danker, PA-C   2 mg at 07/29/14 [phone]  . ondansetron (ZOFRAN) injection 4 mg  4 mg Intravenous Q6H PRN Saverio Danker, PA-C      . pantoprazole (PROTONIX) EC tablet 40 mg  40 mg Oral Daily Saverio Danker, PA-C   40 mg at 07/28/14 1448  . polyethylene glycol (MIRALAX / GLYCOLAX) packet 17 g  17 g Oral Daily PRN Saverio Danker, PA-C      . regadenoson (LEXISCAN) injection SOLN 0.4 mg  0.4 mg Intravenous Once Brittainy  Erie Noe, PA-C   0.4 mg at 07/29/14 1105  . verapamil (CALAN) tablet 120 mg  120 mg Oral BID Saverio Danker, PA-C   120 mg at 07/28/14 2238    PE: General appearance: alert, cooperative, no distress and moderately obese Neck: no carotid bruit and no JVD Lungs: clear to auscultation bilaterally Heart: regular rate and rhythm, S1, S2 normal, no murmur, click, rub or gallop Extremities: no LEE Pulses: 2+ and symmetric Skin: warm and dry Neurologic: Grossly normal  Lab Results:   Recent Labs  07/28/14 0738 07/28/14 1415  WBC 4.0 4.3  HGB 12.8 12.2  HCT 39.5 38.3  PLT 220 200   BMET  Recent Labs  07/28/14 0738 07/28/14 1415  NA 140  --   K 3.4*  --   CL 105  --   CO2 26  --   GLUCOSE 104*  --   BUN 8  --     CREATININE 0.76 0.81  CALCIUM 9.3  --    Cardiac Panel (last 3 results) No results for input(s): CKTOTAL, CKMB, TROPONINI, RELINDX in the last 72 hours.  Studies/Results: Exercise Myoview -results pending   Assessment/Plan    Principal Problem:   Biliary colic Active Problems:   Chest pain  1. Chest Pain: Her chest pain is atypical and is not likely cardiac related. She is known to have normal coronary arteries by cardiac cath in March of 2103. She is very concerned about her pain, especially given the exertional component. She completed stress portion of Myoview today and tolerated well w/o ST changes. She had no CP during stress test. She reached target HR of 140 bpm. Max HR reached was 160 bpm. Radiologist interpretation pending. 2D echo results also pending.   2. Biliary Colic: management per Gen. Surgery. If NST shows no ischemia and if 2D echo w/o any significant findings, she will be cleared for cholecystectomy . We will follow up on results.     LOS: 1 day    Brittainy M. Ladoris Gene 07/29/2014 11:23 AM  Patient seen, examined. Available data reviewed. Agree with findings, assessment, and plan as outlined by Lyda Jester, PA-C. The patient was independently interviewed and examined. Her nuclear scan is positive for 2 small areas of ischemia, one in the mid anterior wall and one in the mid inferior wall. I have recommended cardiac catheterization in the setting of her chest pain and intermediate risk stress test.  I have reviewed the risks, indications, and alternatives to cardiac catheterization and possible PCI with the patient. Risks include but are not limited to bleeding, infection, vascular injury, stroke, myocardial infection, arrhythmia, kidney injury, radiation-related injury in the case of prolonged fluoroscopy use, emergency cardiac surgery, and death. The patient understands the risks of serious complication is low (<7%).   The patient's gallbladder surgery  will have to wait until after her heart catheterization. In addition, there is an abnormality of radiotracer uptake in the left breast at the site of her previous breast surgery. I advised her that she will need an exam and possibly a repeat mammogram by her breast cancer physician after discharge.  Sherren Mocha, M.D. 07/29/2014 3:23 PM

## 2014-07-29 NOTE — Progress Notes (Signed)
[facility] Progress Note     Subjective: Pt doing some better.  Pain well controlled.  Pending cardiac workup.  Ambulating well.  No N/V.  Hungry, but NPO.  Objective: Vital signs in last 24 hours: Temp:  [97.7 F (36.5 C)-98.3 F (36.8 C)] 98.3 F (36.8 C) (03/24 0631) Pulse Rate:  [43-79] 67 (03/24 0631) Resp:  [13-18] 15 (03/24 0631) BP: (99-142)/(48-97) 118/61 mmHg (03/24 0631) SpO2:  [96 %-100 %] 100 % (03/24 0631) Weight:  [123.9 kg (273 lb 2.4 oz)] 123.9 kg (273 lb 2.4 oz) (03/23 1217) Last BM Date: 07/28/14  Intake/Output from previous day: 03/23 0701 - 03/24 0700 In: 1704 [P.O.:240; I.V.:1464] Out: 6 [Urine:6] Intake/Output this shift:    PE: Gen:  Alert, NAD, pleasant Card:  RRR, no M/G/R heard Pulm:  CTA, no W/R/R  Abd: Soft, ND, mild tenderness in RUQ/LUQ, +BS, no HSM   Lab Results:   Recent Labs  07/28/14 0738 07/28/14 1415  WBC 4.0 4.3  HGB 12.8 12.2  HCT 39.5 38.3  PLT 220 200   BMET  Recent Labs  07/28/14 0738 07/28/14 1415  NA 140  --   K 3.4*  --   CL 105  --   CO2 26  --   GLUCOSE 104*  --   BUN 8  --   CREATININE 0.76 0.81  CALCIUM 9.3  --    PT/INR No results for input(s): LABPROT, INR in the last 72 hours. CMP     Component Value Date/Time   NA 140 07/28/2014 0738   K 3.4* 07/28/2014 0738   CL 105 07/28/2014 0738   CO2 26 07/28/2014 0738   GLUCOSE 104* 07/28/2014 0738   BUN 8 07/28/2014 0738   CREATININE 0.81 07/28/2014 1415   CREATININE 0.70 07/04/2014 1042   CALCIUM 9.3 07/28/2014 0738   PROT 7.5 07/28/2014 0738   ALBUMIN 3.5 07/28/2014 0738   AST 21 07/28/2014 0738   ALT 12 07/28/2014 0738   ALKPHOS 84 07/28/2014 0738   BILITOT 0.7 07/28/2014 0738   GFRNONAA 80* 07/28/2014 1415   GFRNONAA >89 07/04/2014 1042   GFRAA >90 07/28/2014 1415   GFRAA >89 07/04/2014 1042   Lipase     Component Value Date/Time   LIPASE 17 07/28/2014 0738       Studies/Results: Dg Chest Port 1 View  07/28/2014    CLINICAL DATA:  Chest pain for 5 days.  EXAM: PORTABLE CHEST - 1 VIEW  COMPARISON:  02/19/2014  FINDINGS: The heart size and mediastinal contours are within normal limits. Both lungs are clear. The visualized skeletal structures are unremarkable.  IMPRESSION: No active disease.   Electronically Signed   By: Rolm Baptise M.D.   On: 07/28/2014 16:17    Anti-infectives: Anti-infectives    Start     Dose/Rate Route Frequency Ordered Stop   07/28/14 1115  cefTRIAXone (ROCEPHIN) 2 g in dextrose 5 % 50 mL IVPB     2 g 100 mL/hr over 30 Minutes Intravenous Every 24 hours 07/28/14 1105         Assessment/Plan Biliary colic -Pain control, antiemetics -NPO MN for testing, IVFs -Prophylactic Rocephin, with concern for cholecystitis  Chest pain with exertion -Patient has chest pain/presure with exertion with pain going to her left arm and jaw. Cardiology pending Echo and stress test -Pre-operative clearance pending further workup -Portable CXR shows no active dz -EKG done and in chart DVT prophylaxis -Heparin/SCDs HTN -home meds    LOS: 1  day    Coralie Keens 07/29/2014, 7:56 AM Pager: [phone]

## 2014-07-29 NOTE — Progress Notes (Deleted)
  Echocardiogram 2D Echocardiogram has been performed.  Donata Clay 07/29/2014, 11:31 AM

## 2014-07-30 ENCOUNTER — Encounter (HOSPITAL_COMMUNITY)
Admission: EM | Disposition: A | Discharge status: Home / Self Care | Payer: Self-pay | Source: Home / Self Care | Attending: Emergency Medicine

## 2014-07-30 ENCOUNTER — Ambulatory Visit: Payer: 59 | Admitting: Cardiovascular Disease

## 2014-07-30 ENCOUNTER — Encounter (HOSPITAL_COMMUNITY): Payer: Self-pay | Admitting: Interventional Cardiology

## 2014-07-30 ENCOUNTER — Ambulatory Visit: Payer: 59 | Admitting: Family Medicine

## 2014-07-30 DIAGNOSIS — Z01818 Encounter for other preprocedural examination: Secondary | ICD-10-CM | POA: Insufficient documentation

## 2014-07-30 DIAGNOSIS — R931 Abnormal findings on diagnostic imaging of heart and coronary circulation: Secondary | ICD-10-CM | POA: Diagnosis not present

## 2014-07-30 DIAGNOSIS — R9439 Abnormal result of other cardiovascular function study: Secondary | ICD-10-CM | POA: Insufficient documentation

## 2014-07-30 HISTORY — PX: LEFT HEART CATHETERIZATION WITH CORONARY ANGIOGRAM: SHX5451

## 2014-07-30 LAB — CBC
HEMATOCRIT: 34.9 % — AB (ref 36.0–46.0)
Hemoglobin: 11.2 g/dL — ABNORMAL LOW (ref 12.0–15.0)
MCH: 28.4 pg (ref 26.0–34.0)
MCHC: 32.1 g/dL (ref 30.0–36.0)
MCV: 88.4 fL (ref 78.0–100.0)
Platelets: 193 10*3/uL (ref 150–400)
RBC: 3.95 MIL/uL (ref 3.87–5.11)
RDW: 13.4 % (ref 11.5–15.5)
WBC: 4 10*3/uL (ref 4.0–10.5)

## 2014-07-30 LAB — PROTIME-INR
INR: 1.15 (ref 0.00–1.49)
PROTHROMBIN TIME: 14.9 s (ref 11.6–15.2)

## 2014-07-30 LAB — BASIC METABOLIC PANEL
Anion gap: 3 — ABNORMAL LOW (ref 5–15)
BUN: 5 mg/dL — AB (ref 6–23)
CHLORIDE: 110 mmol/L (ref 96–112)
CO2: 25 mmol/L (ref 19–32)
CREATININE: 0.73 mg/dL (ref 0.50–1.10)
Calcium: 8.7 mg/dL (ref 8.4–10.5)
GFR calc Af Amer: >90 mL/min (ref >90)
GFR calc non Af Amer: >90 mL/min (ref >90)
Glucose, Bld: 88 mg/dL (ref 70–99)
Potassium: 3.8 mmol/L (ref 3.5–5.1)
Sodium: 138 mmol/L (ref 135–145)

## 2014-07-30 SURGERY — LEFT HEART CATHETERIZATION WITH CORONARY ANGIOGRAM
Anesthesia: LOCAL

## 2014-07-30 MED ORDER — HEPARIN SODIUM (PORCINE) 5000 UNIT/ML IJ SOLN
5000.0000 [IU] | Freq: Three times a day (TID) | INTRAMUSCULAR | Status: DC
  Administered 2014-07-30 – 2014-07-31 (×2): 5000 [IU] via SUBCUTANEOUS
  Filled 2014-07-30 (×2): qty 1

## 2014-07-30 MED ORDER — SODIUM CHLORIDE 0.9 % IV SOLN: 1.0000 mL/kg/h | INTRAVENOUS | Status: AC

## 2014-07-30 MED ORDER — HEPARIN SODIUM (PORCINE) 1000 UNIT/ML IJ SOLN
INTRAMUSCULAR | Status: AC
  Filled 2014-07-30: qty 1

## 2014-07-30 MED ORDER — VERAPAMIL HCL 2.5 MG/ML IV SOLN
INTRAVENOUS | Status: AC
  Filled 2014-07-30: qty 2

## 2014-07-30 MED ORDER — FENTANYL CITRATE 0.05 MG/ML IJ SOLN
INTRAMUSCULAR | Status: AC
  Filled 2014-07-30: qty 2

## 2014-07-30 MED ORDER — HEPARIN (PORCINE) IN NACL 2-0.9 UNIT/ML-% IJ SOLN
INTRAMUSCULAR | Status: AC
  Filled 2014-07-30: qty 1000

## 2014-07-30 MED ORDER — MIDAZOLAM HCL 2 MG/2ML IJ SOLN
INTRAMUSCULAR | Status: AC
  Filled 2014-07-30: qty 2

## 2014-07-30 MED ORDER — ACETAMINOPHEN 325 MG PO TABS: 650.0000 mg | ORAL_TABLET | ORAL | Status: DC | PRN

## 2014-07-30 MED ORDER — ONDANSETRON HCL 4 MG/2ML IJ SOLN
4.0000 mg | Freq: Four times a day (QID) | INTRAMUSCULAR | Status: DC | PRN
  Administered 2014-07-31: 4 mg via INTRAVENOUS

## 2014-07-30 MED ORDER — LIDOCAINE HCL (PF) 1 % IJ SOLN
INTRAMUSCULAR | Status: AC
Start: 2014-07-30 — End: 2014-07-30
  Filled 2014-07-30: qty 30

## 2014-07-30 MED ORDER — NITROGLYCERIN 1 MG/10 ML FOR IR/CATH LAB
INTRA_ARTERIAL | Status: AC
  Filled 2014-07-30: qty 10

## 2014-07-30 NOTE — Interval H&P Note (Signed)
Cath Lab Visit (complete for each Cath Lab visit)  Clinical Evaluation Leading to the Procedure:   ACS: No.  Non-ACS:    Anginal Classification: CCS III  Anti-ischemic medical therapy: No Therapy  Non-Invasive Test Results: Intermediate-risk stress test findings: cardiac mortality 1-3%/year  Prior CABG: No previous CABG  Ischemic Symptoms? CCS III (Marked limitation of ordinary activity) Anti-ischemic Medical Therapy? No Therapy Non-invasive Test Results? Intermediate-risk stress test findings: cardiac mortality 1-3%/year Prior CABG? No Previous CABG   Patient Information:   1-2V CAD, no prox LAD  U (6)  Indication: 16; Score: 6   Patient Information:   CTO of 1 vessel, no other CAD  U (6)  Indication: 26; Score: 6   Patient Information:   1V CAD with prox LAD  A (7)  Indication: 32; Score: 7   Patient Information:   2V-CAD with prox LAD  A (8)  Indication: 38; Score: 8   Patient Information:   3V-CAD without LMCA  A (8)  Indication: 44; Score: 8   Patient Information:   3V-CAD without LMCA With Abnormal LV systolic function  A (9)  Indication: 48; Score: 9   Patient Information:   LMCA-CAD  A (9)  Indication: 49; Score: 9   Patient Information:   2V-CAD with prox LAD PCI  A (7)  Indication: 62; Score: 7   Patient Information:   2V-CAD with prox LAD CABG  A (8)  Indication: 62; Score: 8   Patient Information:   3V-CAD without LMCA With Low CAD burden(i.e., 3 focal stenoses, low SYNTAX score) PCI  A (7)  Indication: 63; Score: 7   Patient Information:   3V-CAD without LMCA With Low CAD burden(i.e., 3 focal stenoses, low SYNTAX score) CABG  A (9)  Indication: 63; Score: 9   Patient Information:   3V-CAD without LMCA E06c - Intermediate-high CAD burden (i.e., multiple diffuse lesions, presence of CTO, or high SYNTAX score) PCI  U (4)  Indication: 64; Score: 4   Patient Information:   3V-CAD without  LMCA E06c - Intermediate-high CAD burden (i.e., multiple diffuse lesions, presence of CTO, or high SYNTAX score) CABG  A (9)  Indication: 64; Score: 9   Patient Information:   LMCA-CAD With Isolated LMCA stenosis  PCI  U (6)  Indication: 65; Score: 6   Patient Information:   LMCA-CAD With Isolated LMCA stenosis  CABG  A (9)  Indication: 65; Score: 9   Patient Information:   LMCA-CAD Additional CAD, low CAD burden (i.e., 1- to 2-vessel additional involvement, low SYNTAX score) PCI  U (5)  Indication: 66; Score: 5   Patient Information:   LMCA-CAD Additional CAD, low CAD burden (i.e., 1- to 2-vessel additional involvement, low SYNTAX score) CABG  A (9)  Indication: 66; Score: 9   Patient Information:   LMCA-CAD Additional CAD, intermediate-high CAD burden (i.e., 3-vessel involvement, presence of CTO, or high SYNTAX score) PCI  I (3)  Indication: 67; Score: 3   Patient Information:   LMCA-CAD Additional CAD, intermediate-high CAD burden (i.e., 3-vessel involvement, presence of CTO, or high SYNTAX score) CABG  A (9)  Indication: 67; Score: 9     History and Physical Interval Note:  07/30/2014 11:44 AM  Gail Schmitt  has presented today for surgery, with the diagnosis of abnormal stress test, pre-op clearance   The various methods of treatment have been discussed with the patient and family. After consideration of risks, benefits and other options for treatment, the patient has  consented to  Procedure(s): LEFT HEART CATHETERIZATION WITH CORONARY ANGIOGRAM (N/A) as a surgical intervention .  The patient's history has been reviewed, patient examined, no change in status, stable for surgery.  I have reviewed the patient's chart and labs.  Questions were answered to the patient's satisfaction.     Gail S.

## 2014-07-30 NOTE — CV Procedure (Addendum)
       PROCEDURE:  Left heart catheterization with selective coronary angiography, left ventriculogram.  INDICATIONS:  Abnormal stress test  The risks, benefits, and details of the procedure were explained to the patient.  The patient verbalized understanding and wanted to proceed.  Informed written consent was obtained.  PROCEDURE TECHNIQUE:  After Xylocaine anesthesia a 23F slender sheath was placed in the right radial artery with a single anterior needle wall stick.   IV Heparin was given.  Right coronary angiography was done using a Judkins R4 guide catheter.  Left coronary angiography was done using a Judkins L3.5 guide catheter.  Left ventriculography was done using a pigtail catheter.  A TR band was used for hemostasis.   CONTRAST:  Total of 80 cc.  COMPLICATIONS:  None.    HEMODYNAMICS:  Aortic pressure was 126/79; LV pressure was 126/0; LVEDP 18.  There was no gradient between the left ventricle and aorta.    ANGIOGRAPHIC DATA:   The left main coronary artery is angiographically normal.  The left anterior descending artery is a large vessel which wraps around the apex. There are several small diagonal vessels which are widely patent. There is no significant atherosclerosis in the LAD system.  The left circumflex artery is a large vessel. There is a large first obtuse marginal which is widely patent. There is a medium size ramus vessel which is widely patent. The second obtuse marginal and third obtuse marginal are medium-sized and widely patent. There is no significant atherosclerotic disease in the circumflex system.  The right coronary artery is a medium-sized dominant vessel. The posterior descending artery is small but patent. The posterior lateral artery is medium-sized and patent.  LEFT VENTRICULOGRAM:  Left ventricular angiogram was done in the 30 RAO projection and revealed normal left ventricular wall motion and systolic function with an estimated ejection fraction of 55%.   LVEDP was 18 mmHg.  IMPRESSIONS:  1. No significant coronary artery disease. 2. Normal left ventricular systolic function.  LVEDP 18 mmHg.  Ejection fraction 55%. 3.  False positive nuclear stress test.  RECOMMENDATION:  Continue aggressive preventative therapy.  No revascularization required. No need for dual antiplatelet therapy at this time. Cardiology follow-up with Dr. Burt Knack.

## 2014-07-30 NOTE — Progress Notes (Signed)
Subjective: Stable and alert. Pain well controlled but does get recurrent right upper quadrant pain when narcotics wear off. Denies vomiting. In good spirits. Very talkative and friendly.  Appreciate cardiology evaluation. 2-D echocardiogram results pending. Myoview reveals intermediate risk stress test findings with small reversible defects anterior wall and inferior wall mid ventricle. Ejection fraction 56%. Normal LV wall motion. Apparently she is going to go ahead with a cardiac cath today.  Lab work reveals hemoglobin 11.2, WBC 4000. Potassium 3.8. Creatinine 0.73.   Objective: Vital signs in last 24 hours: Temp:  [97.9 F (36.6 C)-98.6 F (37 C)] 98.6 F (37 C) (03/25 0510) Pulse Rate:  [57-166] 65 (03/25 0510) Resp:  [15-16] 16 (03/25 0510) BP: (105-138)/(49-103) 114/63 mmHg (03/25 0510) SpO2:  [99 %-100 %] 100 % (03/25 0510) Last BM Date: 07/28/14  Intake/Output from previous day: 03/24 0701 - 03/25 0700 In: 3504 [P.O.:1680; I.V.:1774; IV Piggyback:50] Out: 7 [Urine:7] Intake/Output this shift:    General appearance: Alert. Ambulatory. No distress. Resp: clear to auscultation bilaterally GI: Obese. Soft. Subjectively tender right upper quadrant. Not distended. No mass.  Lab Results:   Recent Labs  07/28/14 1415 07/30/14 0436  WBC 4.3 4.0  HGB 12.2 11.2*  HCT 38.3 34.9*  PLT 200 193   BMET  Recent Labs  07/28/14 0738 07/28/14 1415 07/30/14 0436  NA 140  --  138  K 3.4*  --  3.8  CL 105  --  110  CO2 26  --  25  GLUCOSE 104*  --  88  BUN 8  --  5*  CREATININE 0.76 0.81 0.73  CALCIUM 9.3  --  8.7   PT/INR  Recent Labs  07/30/14 0436  LABPROT 14.9  INR 1.15   ABG No results for input(s): PHART, HCO3 in the last 72 hours.  Invalid input(s): PCO2, PO2  Studies/Results: Nm Myocar Multi W/spect W/wall Motion / Ef  07/29/2014   CLINICAL DATA:  54 year old female with a history of left-sided breast carcinoma, mitral valve prolapse, presents  with chest pain.  Cardiovascular risk factors include hypertension.  EXAM: MYOCARDIAL IMAGING WITH SPECT (REST AND PHARMACOLOGIC-STRESS)  GATED LEFT VENTRICULAR WALL MOTION STUDY  LEFT VENTRICULAR EJECTION FRACTION  TECHNIQUE: Standard myocardial SPECT imaging was performed after resting intravenous injection of 30 mCi Tc-48m sestamibi. Subsequently, intravenous infusion of Lexiscan was performed under the supervision of the Cardiology staff. At peak effect of the drug, 30 mCi Tc-24m sestamibi was injected intravenously and standard myocardial SPECT imaging was performed. Quantitative gated imaging was also performed to evaluate left ventricular wall motion, and estimate left ventricular ejection fraction.  COMPARISON:  None.  FINDINGS: Perfusion: Separate small reversible defects are present at the inferior wall and anterior wall mid ventricle.  Wall Motion: Normal left ventricular wall motion. No left ventricular dilation.  Left Ventricular Ejection Fraction: 56 %  End diastolic volume 94 ml  End systolic volume 41 ml  Small focus of radiotracer present at the anterior left chest wall on the raw images.  IMPRESSION: 1. Separate small reversible defects present at the anterior wall and inferior wall mid ventricle.  2. Normal left ventricular wall motion.  3. Left ventricular ejection fraction 56%  4. Intermediate-risk stress test findings*.  5. Raw images demonstrate a small focus of uptake of radiotracer at the anterior left wall in the region of left breast reconstruction. This could represent contamination, however, alternative differential diagnosis includes uptake in inflammation/infection or malignancy. Correlation with physical exam for contamination as well  as any prior mammography is recommended.  *2012 Appropriate Use Criteria for Coronary Revascularization Focused Update: J Am Coll Cardiol. 2536;64(4):034-742. http://content.airportbarriers.com.aspx?articleid=1201161   Electronically Signed   By:  Corrie Mckusick D.O.   On: 07/29/2014 12:22   Dg Chest Port 1 View  07/28/2014   CLINICAL DATA:  Chest pain for 5 days.  EXAM: PORTABLE CHEST - 1 VIEW  COMPARISON:  02/19/2014  FINDINGS: The heart size and mediastinal contours are within normal limits. Both lungs are clear. The visualized skeletal structures are unremarkable.  IMPRESSION: No active disease.   Electronically Signed   By: Rolm Baptise M.D.   On: 07/28/2014 16:17    Anti-infectives: Anti-infectives    Start     Dose/Rate Route Frequency Ordered Stop   07/28/14 1115  cefTRIAXone (ROCEPHIN) 2 g in dextrose 5 % 50 mL IVPB     2 g 100 mL/hr over 30 Minutes Intravenous Every 24 hours 07/28/14 1105        Assessment/Plan: s/p Procedure(s): LEFT HEART CATHETERIZATION WITH CORONARY ANGIOGRAM  Chronic cholecystitis with cholelithiasis and ongoing biliary colic  Continue NPO Continue IV Rocephin  Consider cholecystectomy tomorrow if cardiac risk assessment is favorable.   Chest pain. Atypical.  Myoview shows intermediate risk, and so apparently she will have cardiac cath today  2-D echocardiogram reading pending  Appreciate cardiology assessment   Hypertension. Continue home meds   Obesity   History left breast cancer . Left mastectomy 1999.    LOS: 2 days    Gail Schmitt M 07/30/2014

## 2014-07-30 NOTE — H&P (View-Only) (Signed)
Patient Profile: 54 yo female with history of HTN, obesity and breast cancer who presents to the ED 3/24 with complaints of RUQ abdominal pain and chest pain. She has been found to have gallstones and was admitted by the general surgery team with plans for cholecystectomy. During the evaluation by the surgical team, she also described left sided chest pain with radiation to her left shoulder/arm. She has no documented CAD. She was seen in consultation at [facility] in 2013 by Dr. Sherren Mocha with a similar presentation of chest pain. Cardiac cath 07/13/11 with no evidence of CAD. Echo 07/13/11 with mild LVH, normal LV systolic function, no valve disease. Pre-operative w/u with NST recommended.   Subjective: No complaints. Had one recurrent episode of CP earlier this am. She tolerated Exercise Myoview well. She exercised beyond her target HR of 140. Max HR reached was 160.   Objective: Vital signs in last 24 hours: Temp:  [97.7 F (36.5 C)-98.3 F (36.8 C)] 98.3 F (36.8 C) (03/24 0631) Pulse Rate:  [56-166] 100 (03/24 1008) Resp:  [15-18] 15 (03/24 0631) BP: (111-142)/(48-103) 138/49 mmHg (03/24 1000) SpO2:  [99 %-100 %] 100 % (03/24 0631) Weight:  [273 lb 2.4 oz (123.9 kg)] 273 lb 2.4 oz (123.9 kg) (03/23 1217) Last BM Date: 07/28/14  Intake/Output from previous day: 03/23 0701 - 03/24 0700 In: 1704 [P.O.:240; I.V.:1464] Out: 6 [Urine:6] Intake/Output this shift:    Medications Current Facility-Administered Medications  Medication Dose Route Frequency Provider Last Rate Last Dose  . 0.9 % NaCl with KCl 20 mEq/ L  infusion   Intravenous Continuous Saverio Danker, PA-C 100 mL/hr at 07/29/14 0044    . acetaminophen (TYLENOL) tablet 650 mg  650 mg Oral Q6H PRN Saverio Danker, PA-C       Or  . acetaminophen (TYLENOL) suppository 650 mg  650 mg Rectal Q6H PRN Saverio Danker, PA-C      . cefTRIAXone (ROCEPHIN) 2 g in dextrose 5 % 50 mL IVPB  2 g Intravenous Q24H Saverio Danker, PA-C 100 mL/hr at  07/28/14 1114 2 g at 07/28/14 1114  . diphenhydrAMINE (BENADRYL) injection 12.5-25 mg  12.5-25 mg Intravenous Q6H PRN Saverio Danker, PA-C       Or  . diphenhydrAMINE (BENADRYL) 12.5 MG/5ML elixir 12.5-25 mg  12.5-25 mg Oral Q6H PRN Saverio Danker, PA-C      . feeding supplement (RESOURCE BREEZE) (RESOURCE BREEZE) liquid 1 Container  1 Container Oral TID BM Md Ccs, MD   1 Container at 07/28/14 2123  . heparin injection 5,000 Units  5,000 Units Subcutaneous 3 times per day Saverio Danker, PA-C   5,000 Units at 07/29/14 0546  . lisinopril (PRINIVIL,ZESTRIL) tablet 20 mg  20 mg Oral Daily Md Ccs, MD       And  . hydrochlorothiazide (HYDRODIURIL) tablet 25 mg  25 mg Oral Daily Md Ccs, MD      . morphine 2 MG/ML injection 1-4 mg  1-4 mg Intravenous Q2H PRN Saverio Danker, PA-C   2 mg at 07/29/14 [phone]  . ondansetron (ZOFRAN) injection 4 mg  4 mg Intravenous Q6H PRN Saverio Danker, PA-C      . pantoprazole (PROTONIX) EC tablet 40 mg  40 mg Oral Daily Saverio Danker, PA-C   40 mg at 07/28/14 1448  . polyethylene glycol (MIRALAX / GLYCOLAX) packet 17 g  17 g Oral Daily PRN Saverio Danker, PA-C      . regadenoson (LEXISCAN) injection SOLN 0.4 mg  0.4 mg Intravenous Once Brittainy  Erie Noe, PA-C   0.4 mg at 07/29/14 1105  . verapamil (CALAN) tablet 120 mg  120 mg Oral BID Saverio Danker, PA-C   120 mg at 07/28/14 2238    PE: General appearance: alert, cooperative, no distress and moderately obese Neck: no carotid bruit and no JVD Lungs: clear to auscultation bilaterally Heart: regular rate and rhythm, S1, S2 normal, no murmur, click, rub or gallop Extremities: no LEE Pulses: 2+ and symmetric Skin: warm and dry Neurologic: Grossly normal  Lab Results:   Recent Labs  07/28/14 0738 07/28/14 1415  WBC 4.0 4.3  HGB 12.8 12.2  HCT 39.5 38.3  PLT 220 200   BMET  Recent Labs  07/28/14 0738 07/28/14 1415  NA 140  --   K 3.4*  --   CL 105  --   CO2 26  --   GLUCOSE 104*  --   BUN 8  --     CREATININE 0.76 0.81  CALCIUM 9.3  --    Cardiac Panel (last 3 results) No results for input(s): CKTOTAL, CKMB, TROPONINI, RELINDX in the last 72 hours.  Studies/Results: Exercise Myoview -results pending   Assessment/Plan    Principal Problem:   Biliary colic Active Problems:   Chest pain  1. Chest Pain: Her chest pain is atypical and is not likely cardiac related. She is known to have normal coronary arteries by cardiac cath in March of 2103. She is very concerned about her pain, especially given the exertional component. She completed stress portion of Myoview today and tolerated well w/o ST changes. She had no CP during stress test. She reached target HR of 140 bpm. Max HR reached was 160 bpm. Radiologist interpretation pending. 2D echo results also pending.   2. Biliary Colic: management per Gen. Surgery. If NST shows no ischemia and if 2D echo w/o any significant findings, she will be cleared for cholecystectomy . We will follow up on results.     LOS: 1 day    Brittainy M. Ladoris Gene 07/29/2014 11:23 AM  Patient seen, examined. Available data reviewed. Agree with findings, assessment, and plan as outlined by Lyda Jester, PA-C. The patient was independently interviewed and examined. Her nuclear scan is positive for 2 small areas of ischemia, one in the mid anterior wall and one in the mid inferior wall. I have recommended cardiac catheterization in the setting of her chest pain and intermediate risk stress test.  I have reviewed the risks, indications, and alternatives to cardiac catheterization and possible PCI with the patient. Risks include but are not limited to bleeding, infection, vascular injury, stroke, myocardial infection, arrhythmia, kidney injury, radiation-related injury in the case of prolonged fluoroscopy use, emergency cardiac surgery, and death. The patient understands the risks of serious complication is low (<2%).   The patient's gallbladder surgery  will have to wait until after her heart catheterization. In addition, there is an abnormality of radiotracer uptake in the left breast at the site of her previous breast surgery. I advised her that she will need an exam and possibly a repeat mammogram by her breast cancer physician after discharge.  Sherren Mocha, M.D. 07/29/2014 3:23 PM

## 2014-07-31 ENCOUNTER — Inpatient Hospital Stay (HOSPITAL_COMMUNITY): Payer: 59 | Admitting: Anesthesiology

## 2014-07-31 ENCOUNTER — Inpatient Hospital Stay (HOSPITAL_COMMUNITY): Payer: 59

## 2014-07-31 ENCOUNTER — Encounter (HOSPITAL_COMMUNITY)
Admission: EM | Disposition: A | Discharge status: Home / Self Care | Payer: Self-pay | Source: Home / Self Care | Attending: Emergency Medicine

## 2014-07-31 HISTORY — PX: CHOLECYSTECTOMY: SHX55

## 2014-07-31 LAB — SURGICAL PCR SCREEN
MRSA, PCR: NEGATIVE
Staphylococcus aureus: NEGATIVE

## 2014-07-31 SURGERY — LAPAROSCOPIC CHOLECYSTECTOMY WITH INTRAOPERATIVE CHOLANGIOGRAM
Anesthesia: General | Site: Abdomen

## 2014-07-31 MED ORDER — DEXAMETHASONE SODIUM PHOSPHATE 4 MG/ML IJ SOLN
INTRAMUSCULAR | Status: DC | PRN
  Administered 2014-07-31: 4 mg via INTRAVENOUS

## 2014-07-31 MED ORDER — MIDAZOLAM HCL 2 MG/2ML IJ SOLN
INTRAMUSCULAR | Status: AC
  Filled 2014-07-31: qty 2

## 2014-07-31 MED ORDER — ROCURONIUM BROMIDE 50 MG/5ML IV SOLN
INTRAVENOUS | Status: AC
  Filled 2014-07-31: qty 1

## 2014-07-31 MED ORDER — HYDROMORPHONE HCL 1 MG/ML IJ SOLN
0.2500 mg | INTRAMUSCULAR | Status: DC | PRN
  Administered 2014-07-31 (×2): 0.5 mg via INTRAVENOUS

## 2014-07-31 MED ORDER — VECURONIUM BROMIDE 10 MG IV SOLR
INTRAVENOUS | Status: DC | PRN
  Administered 2014-07-31: 4 mg via INTRAVENOUS
  Administered 2014-07-31 (×2): 1 mg via INTRAVENOUS

## 2014-07-31 MED ORDER — GLYCOPYRROLATE 0.2 MG/ML IJ SOLN
INTRAMUSCULAR | Status: DC | PRN
  Administered 2014-07-31: 0.2 mg via INTRAVENOUS
  Administered 2014-07-31: 0.4 mg via INTRAVENOUS

## 2014-07-31 MED ORDER — GLYCOPYRROLATE 0.2 MG/ML IJ SOLN
INTRAMUSCULAR | Status: AC
  Filled 2014-07-31: qty 1

## 2014-07-31 MED ORDER — SCOPOLAMINE 1 MG/3DAYS TD PT72
MEDICATED_PATCH | TRANSDERMAL | Status: AC
  Filled 2014-07-31: qty 1

## 2014-07-31 MED ORDER — EPHEDRINE SULFATE 50 MG/ML IJ SOLN
INTRAMUSCULAR | Status: AC
  Filled 2014-07-31: qty 1

## 2014-07-31 MED ORDER — GLYCOPYRROLATE 0.2 MG/ML IJ SOLN
INTRAMUSCULAR | Status: AC
  Filled 2014-07-31: qty 3

## 2014-07-31 MED ORDER — ONDANSETRON HCL 4 MG/2ML IJ SOLN
INTRAMUSCULAR | Status: DC | PRN
  Administered 2014-07-31: 4 mg via INTRAVENOUS

## 2014-07-31 MED ORDER — SODIUM CHLORIDE 0.9 % IV SOLN
INTRAVENOUS | Status: DC | PRN
  Administered 2014-07-31: 10 mL

## 2014-07-31 MED ORDER — HYDRALAZINE HCL 20 MG/ML IJ SOLN
INTRAMUSCULAR | Status: AC
  Filled 2014-07-31: qty 1

## 2014-07-31 MED ORDER — SODIUM CHLORIDE 0.9 % IR SOLN
Status: DC | PRN
  Administered 2014-07-31: 1000 mL

## 2014-07-31 MED ORDER — GLYCOPYRROLATE 0.2 MG/ML IJ SOLN
INTRAMUSCULAR | Status: AC
  Filled 2014-07-31: qty 2

## 2014-07-31 MED ORDER — POTASSIUM CHLORIDE IN NACL 20-0.9 MEQ/L-% IV SOLN
INTRAVENOUS | Status: DC
  Administered 2014-07-31: 13:00:00 via INTRAVENOUS
  Administered 2014-08-01: 1 mL via INTRAVENOUS
  Filled 2014-07-31 (×2): qty 1000

## 2014-07-31 MED ORDER — PROPOFOL 10 MG/ML IV BOLUS
INTRAVENOUS | Status: DC | PRN
  Administered 2014-07-31: 150 mg via INTRAVENOUS

## 2014-07-31 MED ORDER — SUCCINYLCHOLINE CHLORIDE 20 MG/ML IJ SOLN
INTRAMUSCULAR | Status: DC | PRN
  Administered 2014-07-31: 100 mg via INTRAVENOUS

## 2014-07-31 MED ORDER — FENTANYL CITRATE 0.05 MG/ML IJ SOLN
INTRAMUSCULAR | Status: AC
  Filled 2014-07-31: qty 5

## 2014-07-31 MED ORDER — FENTANYL CITRATE 0.05 MG/ML IJ SOLN
INTRAMUSCULAR | Status: DC | PRN
  Administered 2014-07-31: 200 ug via INTRAVENOUS
  Administered 2014-07-31: 50 ug via INTRAVENOUS

## 2014-07-31 MED ORDER — LABETALOL HCL 5 MG/ML IV SOLN
INTRAVENOUS | Status: DC | PRN
  Administered 2014-07-31 (×2): 10 mg via INTRAVENOUS

## 2014-07-31 MED ORDER — LIDOCAINE HCL (CARDIAC) 20 MG/ML IV SOLN
INTRAVENOUS | Status: AC
  Filled 2014-07-31: qty 5

## 2014-07-31 MED ORDER — HYDROCODONE-ACETAMINOPHEN 5-325 MG PO TABS
1.0000 | ORAL_TABLET | ORAL | Status: DC | PRN
  Administered 2014-08-01: 1 via ORAL
  Filled 2014-07-31: qty 1

## 2014-07-31 MED ORDER — BUPIVACAINE-EPINEPHRINE (PF) 0.25% -1:200000 IJ SOLN
INTRAMUSCULAR | Status: AC
  Filled 2014-07-31: qty 30

## 2014-07-31 MED ORDER — ONDANSETRON HCL 4 MG/2ML IJ SOLN
INTRAMUSCULAR | Status: AC
  Filled 2014-07-31: qty 2

## 2014-07-31 MED ORDER — PROPOFOL 10 MG/ML IV BOLUS
INTRAVENOUS | Status: AC
  Filled 2014-07-31: qty 20

## 2014-07-31 MED ORDER — NEOSTIGMINE METHYLSULFATE 10 MG/10ML IV SOLN
INTRAVENOUS | Status: DC | PRN
  Administered 2014-07-31: 1 mg via INTRAVENOUS
  Administered 2014-07-31: 2 mg via INTRAVENOUS

## 2014-07-31 MED ORDER — PHENYLEPHRINE 40 MCG/ML (10ML) SYRINGE FOR IV PUSH (FOR BLOOD PRESSURE SUPPORT)
PREFILLED_SYRINGE | INTRAVENOUS | Status: AC
  Filled 2014-07-31: qty 10

## 2014-07-31 MED ORDER — ARTIFICIAL TEARS OP OINT
TOPICAL_OINTMENT | OPHTHALMIC | Status: AC
  Filled 2014-07-31: qty 3.5

## 2014-07-31 MED ORDER — MIDAZOLAM HCL 2 MG/2ML IJ SOLN
INTRAMUSCULAR | Status: DC | PRN
  Administered 2014-07-31: 2 mg via INTRAVENOUS

## 2014-07-31 MED ORDER — PROMETHAZINE HCL 25 MG/ML IJ SOLN: 6.2500 mg | INTRAMUSCULAR | Status: DC | PRN

## 2014-07-31 MED ORDER — SUCCINYLCHOLINE CHLORIDE 20 MG/ML IJ SOLN
INTRAMUSCULAR | Status: AC
  Filled 2014-07-31: qty 1

## 2014-07-31 MED ORDER — 0.9 % SODIUM CHLORIDE (POUR BTL) OPTIME
TOPICAL | Status: DC | PRN
  Administered 2014-07-31: 1000 mL

## 2014-07-31 MED ORDER — LABETALOL HCL 5 MG/ML IV SOLN
INTRAVENOUS | Status: AC
  Filled 2014-07-31: qty 4

## 2014-07-31 MED ORDER — KETOROLAC TROMETHAMINE 30 MG/ML IJ SOLN: 30.0000 mg | Freq: Once | INTRAMUSCULAR | Status: DC | PRN

## 2014-07-31 MED ORDER — LACTATED RINGERS IV SOLN
INTRAVENOUS | Status: DC
  Administered 2014-07-31: 09:00:00 via INTRAVENOUS

## 2014-07-31 MED ORDER — SODIUM CHLORIDE 0.9 % IV SOLN
INTRAVENOUS | Status: DC | PRN
  Administered 2014-07-31 (×3): via INTRAVENOUS

## 2014-07-31 MED ORDER — SCOPOLAMINE 1 MG/3DAYS TD PT72
MEDICATED_PATCH | TRANSDERMAL | Status: DC | PRN
  Administered 2014-07-31: 1 via TRANSDERMAL

## 2014-07-31 MED ORDER — HYDROMORPHONE HCL 1 MG/ML IJ SOLN
INTRAMUSCULAR | Status: AC
  Administered 2014-07-31: 0.5 mg via INTRAVENOUS
  Filled 2014-07-31: qty 1

## 2014-07-31 MED ORDER — NEOSTIGMINE METHYLSULFATE 10 MG/10ML IV SOLN
INTRAVENOUS | Status: AC
  Filled 2014-07-31: qty 1

## 2014-07-31 MED ORDER — VECURONIUM BROMIDE 10 MG IV SOLR
INTRAVENOUS | Status: AC
  Filled 2014-07-31: qty 10

## 2014-07-31 MED ORDER — SODIUM CHLORIDE 0.9 % IJ SOLN
INTRAMUSCULAR | Status: AC
  Filled 2014-07-31: qty 10

## 2014-07-31 MED ORDER — BUPIVACAINE-EPINEPHRINE 0.25% -1:200000 IJ SOLN
INTRAMUSCULAR | Status: DC | PRN
  Administered 2014-07-31: 6 mL

## 2014-07-31 MED ORDER — KETOROLAC TROMETHAMINE 30 MG/ML IJ SOLN
30.0000 mg | Freq: Four times a day (QID) | INTRAMUSCULAR | Status: DC | PRN
  Administered 2014-07-31 – 2014-08-01 (×3): 30 mg via INTRAVENOUS
  Filled 2014-07-31 (×3): qty 1

## 2014-07-31 SURGICAL SUPPLY — 38 items
APPLIER CLIP ROT 10 11.4 M/L (STAPLE) ×2
BLADE SURG ROTATE 9660 (MISCELLANEOUS) IMPLANT
CANISTER SUCTION 2500CC (MISCELLANEOUS) ×2 IMPLANT
CHLORAPREP W/TINT 26ML (MISCELLANEOUS) ×2 IMPLANT
CLIP APPLIE ROT 10 11.4 M/L (STAPLE) ×1 IMPLANT
COVER MAYO STAND STRL (DRAPES) ×2 IMPLANT
COVER SURGICAL LIGHT HANDLE (MISCELLANEOUS) ×2 IMPLANT
DRAPE C-ARM 42X72 X-RAY (DRAPES) ×2 IMPLANT
DRAPE LAPAROSCOPIC ABDOMINAL (DRAPES) ×2 IMPLANT
DRAPE WARM FLUID 44X44 (DRAPE) IMPLANT
ELECT REM PT RETURN 9FT ADLT (ELECTROSURGICAL) ×2
ELECTRODE REM PT RTRN 9FT ADLT (ELECTROSURGICAL) ×1 IMPLANT
GLOVE BIO SURGEON STRL SZ8 (GLOVE) ×2 IMPLANT
GLOVE BIOGEL PI IND STRL 8 (GLOVE) ×1 IMPLANT
GLOVE BIOGEL PI INDICATOR 8 (GLOVE) ×1
GOWN STRL REUS W/ TWL LRG LVL3 (GOWN DISPOSABLE) ×2 IMPLANT
GOWN STRL REUS W/ TWL XL LVL3 (GOWN DISPOSABLE) ×1 IMPLANT
GOWN STRL REUS W/TWL LRG LVL3 (GOWN DISPOSABLE) ×2
GOWN STRL REUS W/TWL XL LVL3 (GOWN DISPOSABLE) ×1
KIT BASIN OR (CUSTOM PROCEDURE TRAY) ×2 IMPLANT
KIT ROOM TURNOVER OR (KITS) ×2 IMPLANT
LIQUID BAND (GAUZE/BANDAGES/DRESSINGS) ×2 IMPLANT
NS IRRIG 1000ML POUR BTL (IV SOLUTION) ×2 IMPLANT
PAD ARMBOARD 7.5X6 YLW CONV (MISCELLANEOUS) ×2 IMPLANT
POUCH SPECIMEN RETRIEVAL 10MM (ENDOMECHANICALS) ×2 IMPLANT
SCISSORS LAP 5X35 DISP (ENDOMECHANICALS) ×2 IMPLANT
SET CHOLANGIOGRAPH 5 50 .035 (SET/KITS/TRAYS/PACK) ×2 IMPLANT
SET IRRIG TUBING LAPAROSCOPIC (IRRIGATION / IRRIGATOR) ×2 IMPLANT
SLEEVE ENDOPATH XCEL 5M (ENDOMECHANICALS) ×2 IMPLANT
SPECIMEN JAR SMALL (MISCELLANEOUS) ×2 IMPLANT
SUT MNCRL AB 4-0 PS2 18 (SUTURE) ×2 IMPLANT
TOWEL OR 17X24 6PK STRL BLUE (TOWEL DISPOSABLE) ×2 IMPLANT
TOWEL OR 17X26 10 PK STRL BLUE (TOWEL DISPOSABLE) ×2 IMPLANT
TRAY LAPAROSCOPIC (CUSTOM PROCEDURE TRAY) ×2 IMPLANT
TROCAR XCEL BLUNT TIP 100MML (ENDOMECHANICALS) ×2 IMPLANT
TROCAR XCEL NON-BLD 11X100MML (ENDOMECHANICALS) ×2 IMPLANT
TROCAR XCEL NON-BLD 5MMX100MML (ENDOMECHANICALS) ×2 IMPLANT
TUBING INSUFFLATION (TUBING) ×2 IMPLANT

## 2014-07-31 NOTE — Anesthesia Preprocedure Evaluation (Addendum)
Anesthesia Evaluation  Patient identified by MRN, date of birth, ID band Patient awake    Reviewed: Allergy & Precautions, NPO status , Patient's Chart, lab work & pertinent test results  Airway Mallampati: III  TM Distance: <3 FB Neck ROM: Full    Dental no notable dental hx. (+) Teeth Intact, Dental Advisory Given   Pulmonary neg pulmonary ROS,  breath sounds clear to auscultation  Pulmonary exam normal       Cardiovascular hypertension, Pt. on medications Rhythm:Regular Rate:Normal     Neuro/Psych negative neurological ROS  negative psych ROS   GI/Hepatic negative GI ROS, Neg liver ROS,   Endo/Other  Morbid obesity  Renal/GU negative Renal ROS  negative genitourinary   Musculoskeletal negative musculoskeletal ROS (+)   Abdominal   Peds negative pediatric ROS (+)  Hematology negative hematology ROS (+)   Anesthesia Other Findings   Reproductive/Obstetrics negative OB ROS                            Anesthesia Physical Anesthesia Plan  ASA: III  Anesthesia Plan: General   Post-op Pain Management:    Induction: Intravenous  Airway Management Planned: Oral ETT  Additional Equipment:   Intra-op Plan:   Post-operative Plan: Extubation in OR  Informed Consent: I have reviewed the patients History and Physical, chart, labs and discussed the procedure including the risks, benefits and alternatives for the proposed anesthesia with the patient or authorized representative who has indicated his/her understanding and acceptance.   Dental advisory given  Plan Discussed with: CRNA and Surgeon  Anesthesia Plan Comments:         Anesthesia Quick Evaluation

## 2014-07-31 NOTE — Interval H&P Note (Signed)
History and Physical Interval Note:  07/31/2014 9:09 AM  Gail Schmitt  has presented today for surgery, with the diagnosis of abdominal pain  The various methods of treatment have been discussed with the patient and family. After consideration of risks, benefits and other options for treatment, the patient has consented to  Procedure(s): LAPAROSCOPIC CHOLECYSTECTOMY WITH INTRAOPERATIVE CHOLANGIOGRAM (N/A) as a surgical intervention .  The patient's history has been reviewed, patient examined, no change in status, stable for surgery.  I have reviewed the patient's chart and labs.  Questions were answered to the patient's satisfaction.   Pt seen examined and chart reviewed.  The procedure has been discussed with the patient. Operative and non operative treatments have been discussed. Risks of surgery include bleeding, infection,  Common bile duct injury,  Injury to the stomach,liver, colon,small intestine, abdominal wall,  Diaphragm,  Major blood vessels,  And the need for an open procedure.  Other risks include worsening of medical problems, death,  DVT and pulmonary embolism, and cardiovascular events.   Medical options have also been discussed. The patient has been informed of long term expectations of surgery and non surgical options,  The patient agrees to proceed.    Raeqwon Lux A.

## 2014-07-31 NOTE — Op Note (Signed)
Laparoscopic Cholecystectomy with IOC Procedure Note  Indications: This patient presents with symptomatic gallbladder disease and will undergo laparoscopic cholecystectomy.The procedure has been discussed with the patient. Operative and non operative treatments have been discussed. Risks of surgery include bleeding, infection,  Common bile duct injury,  Injury to the stomach,liver, colon,small intestine, abdominal wall,  Diaphragm,  Major blood vessels,  And the need for an open procedure.  Other risks include worsening of medical problems, death,  DVT and pulmonary embolism, and cardiovascular events.   Medical options have also been discussed. The patient has been informed of long term expectations of surgery and non surgical options,  The patient agrees to proceed.    Pre-operative Diagnosis: Acute cholecystitis  Post-operative Diagnosis: Same  Surgeon: Janeya Deyo A.   Assistants: OR staff  Anesthesia: General endotracheal anesthesia and Local anesthesia 0.25.% bupivacaine, with epinephrine  ASA Class: 2  Procedure Details  The patient was seen again in the Holding Room. The risks, benefits, complications, treatment options, and expected outcomes were discussed with the patient. The possibilities of reaction to medication, pulmonary aspiration, perforation of viscus, bleeding, recurrent infection, finding a normal gallbladder, the need for additional procedures, failure to diagnose a condition, the possible need to convert to an open procedure, and creating a complication requiring transfusion or operation were discussed with the patient. The patient and/or family concurred with the proposed plan, giving informed consent. The site of surgery properly noted/marked. The patient was taken to Operating Room, identified as Gail Schmitt and the procedure verified as Laparoscopic Cholecystectomy with Intraoperative Cholangiograms. A Time Out was held and the above information confirmed.  Prior  to the induction of general anesthesia, antibiotic prophylaxis was administered. General endotracheal anesthesia was then administered and tolerated well. After the induction, the abdomen was prepped in the usual sterile fashion. The patient was positioned in the supine position with the left arm comfortably tucked, along with some reverse Trendelenburg.  Local anesthetic agent was injected into the skin near the umbilicus and an incision made. The midline fascia was incised and the Hasson technique was used to introduce a 12 mm port under direct vision. It was secured with a figure of eight Vicryl suture placed in the usual fashion. Pneumoperitoneum was then created with CO2 and tolerated well without any adverse changes in the patient's vital signs. Additional trocars were introduced under direct vision with an 11 mm trocar in the epigastrium and two  5 mm trocars in the right upper quadrant. All skin incisions were infiltrated with a local anesthetic agent before making the incision and placing the trocars.  There were some anterior abdominal wall adhesions noted below the umbilical port site.  The port was well away from the adhesions and some loops of small bowel associated with it. No evidence of bowel injury with insertion of this port.   The gallbladder was identified, the fundus grasped and retracted cephalad. Adhesions were lysed bluntly and with the electrocautery where indicated, taking care not to injure any adjacent organs or viscus. The infundibulum was grasped and retracted laterally, exposing the peritoneum overlying the triangle of Calot. This was then divided and exposed in a blunt fashion. The cystic duct was clearly identified and bluntly dissected circumferentially. The junctions of the gallbladder, cystic duct and common bile duct were clearly identified prior to the division of any linear structure.   An incision was made in the cystic duct and the cholangiogram catheter introduced. The  catheter was secured using an endoclip. The study  showed no stones and good visualization of the distal and proximal biliary tree. The catheter was then removed.   The cystic duct was then  ligated with surgical clips  on the patient side and  clipped on the gallbladder side and divided. The cystic artery was identified, dissected free, ligated with clips and divided as well. Posterior cystic artery clipped and divided.  The gallbladder was dissected from the liver bed in retrograde fashion with the electrocautery. The gallbladder was removed with bag. . The liver bed was irrigated and inspected. Hemostasis was achieved with the electrocautery. Copious irrigation was utilized and was repeatedly aspirated until clear all particulate matter. Hemostasis was achieved with no signs  Of bleeding or bile leakage.  Pneumoperitoneum was completely reduced after viewing removal of the trocars under direct vision. The wound was thoroughly irrigated and the fascia was then closed with a figure of eight suture; the skin was then closed with 4 0 monocryl  and a liquid  dressing was applied.  Instrument, sponge, and needle counts were correct at closure and at the conclusion of the case.   Findings: Cholecystitis with Cholelithiasis  Estimated Blood Loss: Minimal         Drains: none         Total IV Fluids: 500 mL         Specimens: Gallbladder           Complications: None; patient tolerated the procedure well.         Disposition: PACU - hemodynamically stable.         Condition: stable

## 2014-07-31 NOTE — Anesthesia Procedure Notes (Signed)
Procedure Name: Intubation Date/Time: 07/31/2014 9:55 AM Performed by: Marinda Elk A Pre-anesthesia Checklist: Patient identified and Timeout performed Patient Re-evaluated:Patient Re-evaluated prior to inductionOxygen Delivery Method: Circle system utilized Preoxygenation: Pre-oxygenation with 100% oxygen Intubation Type: IV induction Ventilation: Mask ventilation without difficulty Grade View: Grade I Tube type: Oral Tube size: 7.5 mm Number of attempts: 1 Airway Equipment and Method: Video-laryngoscopy Placement Confirmation: ETT inserted through vocal cords under direct vision and breath sounds checked- equal and bilateral Secured at: 22 cm Tube secured with: Tape Dental Injury: Teeth and Oropharynx as per pre-operative assessment  Difficulty Due To: Difficult Airway- due to dentition and Difficulty was anticipated

## 2014-07-31 NOTE — H&P (View-Only) (Signed)
1 Day Post-Op  Subjective: Still having intermittent RUQ pains.  Cardiac cath negative  Objective: Vital signs in last 24 hours: Temp:  [98.2 F (36.8 C)-98.4 F (36.9 C)] 98.2 F (36.8 C) (03/26 0513) Pulse Rate:  [44-97] 68 (03/26 0513) Resp:  [11-19] 16 (03/26 0513) BP: (120-167)/(57-94) 152/63 mmHg (03/26 0513) SpO2:  [99 %-100 %] 100 % (03/26 0513) Last BM Date: 07/28/14  Intake/Output from previous day: 03/25 0701 - 03/26 0700 In: 2413.3 [P.O.:220; I.V.:2193.3] Out: 5 [Urine:5] Intake/Output this shift:    PE: General- In NAD Abdomen-soft, mild-moderate RUQ and epigastric tenderness  Lab Results:   Recent Labs  07/28/14 1415 07/30/14 0436  WBC 4.3 4.0  HGB 12.2 11.2*  HCT 38.3 34.9*  PLT 200 193   BMET  Recent Labs  07/28/14 1415 07/30/14 0436  NA  --  138  K  --  3.8  CL  --  110  CO2  --  25  GLUCOSE  --  88  BUN  --  5*  CREATININE 0.81 0.73  CALCIUM  --  8.7   PT/INR  Recent Labs  07/30/14 0436  LABPROT 14.9  INR 1.15   Comprehensive Metabolic Panel:    Component Value Date/Time   NA 138 07/30/2014 0436   NA 140 07/28/2014 0738   K 3.8 07/30/2014 0436   K 3.4* 07/28/2014 0738   CL 110 07/30/2014 0436   CL 105 07/28/2014 0738   CO2 25 07/30/2014 0436   CO2 26 07/28/2014 0738   BUN 5* 07/30/2014 0436   BUN 8 07/28/2014 0738   CREATININE 0.73 07/30/2014 0436   CREATININE 0.81 07/28/2014 1415   CREATININE 0.70 07/04/2014 1042   CREATININE 0.71 06/04/2014 1311   GLUCOSE 88 07/30/2014 0436   GLUCOSE 104* 07/28/2014 0738   CALCIUM 8.7 07/30/2014 0436   CALCIUM 9.3 07/28/2014 0738   AST 21 07/28/2014 0738   AST 12 07/04/2014 1042   ALT 12 07/28/2014 0738   ALT <8 07/04/2014 1042   ALKPHOS 84 07/28/2014 0738   ALKPHOS 79 07/04/2014 1042   BILITOT 0.7 07/28/2014 0738   BILITOT 0.6 07/04/2014 1042   PROT 7.5 07/28/2014 0738   PROT 7.3 07/04/2014 1042   ALBUMIN 3.5 07/28/2014 0738   ALBUMIN 3.8 07/04/2014 1042      Studies/Results: Nm Myocar Multi W/spect W/wall Motion / Ef  07/29/2014   CLINICAL DATA:  54 year old female with a history of left-sided breast carcinoma, mitral valve prolapse, presents with chest pain.  Cardiovascular risk factors include hypertension.  EXAM: MYOCARDIAL IMAGING WITH SPECT (REST AND PHARMACOLOGIC-STRESS)  GATED LEFT VENTRICULAR WALL MOTION STUDY  LEFT VENTRICULAR EJECTION FRACTION  TECHNIQUE: Standard myocardial SPECT imaging was performed after resting intravenous injection of 30 mCi Tc-61m sestamibi. Subsequently, intravenous infusion of Lexiscan was performed under the supervision of the Cardiology staff. At peak effect of the drug, 30 mCi Tc-53m sestamibi was injected intravenously and standard myocardial SPECT imaging was performed. Quantitative gated imaging was also performed to evaluate left ventricular wall motion, and estimate left ventricular ejection fraction.  COMPARISON:  None.  FINDINGS: Perfusion: Separate small reversible defects are present at the inferior wall and anterior wall mid ventricle.  Wall Motion: Normal left ventricular wall motion. No left ventricular dilation.  Left Ventricular Ejection Fraction: 56 %  End diastolic volume 94 ml  End systolic volume 41 ml  Small focus of radiotracer present at the anterior left chest wall on the raw images.  IMPRESSION: 1. Separate small  reversible defects present at the anterior wall and inferior wall mid ventricle.  2. Normal left ventricular wall motion.  3. Left ventricular ejection fraction 56%  4. Intermediate-risk stress test findings*.  5. Raw images demonstrate a small focus of uptake of radiotracer at the anterior left wall in the region of left breast reconstruction. This could represent contamination, however, alternative differential diagnosis includes uptake in inflammation/infection or malignancy. Correlation with physical exam for contamination as well as any prior mammography is recommended.  *2012  Appropriate Use Criteria for Coronary Revascularization Focused Update: J Am Coll Cardiol. 0109;32(3):557-322. http://content.airportbarriers.com.aspx?articleid=1201161   Electronically Signed   By: Corrie Mckusick D.O.   On: 07/29/2014 12:22    Anti-infectives: Anti-infectives    Start     Dose/Rate Route Frequency Ordered Stop   07/28/14 1115  cefTRIAXone (ROCEPHIN) 2 g in dextrose 5 % 50 mL IVPB     2 g 100 mL/hr over 30 Minutes Intravenous Every 24 hours 07/28/14 1105        Assessment Chronic calculous cholecystitis with persistent symptoms-pain remains False positive Myoview stress test-cardiac cath negative   LOS: 3 days   Plan: Proceed with laparoscopic possible open cholecystectomy today.  I have explained the procedure, risks, and aftercare of cholecystectomy.  Risks include but are not limited to bleeding, infection, wound problems, anesthesia, diarrhea, bile leak, injury to common bile duct/liver/intestine.  She seems to understand and agrees with the plan.    Jalaine Riggenbach Lenna Sciara 07/31/2014

## 2014-07-31 NOTE — Progress Notes (Signed)
1 Day Post-Op  Subjective: Still having intermittent RUQ pains.  Cardiac cath negative  Objective: Vital signs in last 24 hours: Temp:  [98.2 F (36.8 C)-98.4 F (36.9 C)] 98.2 F (36.8 C) (03/26 0513) Pulse Rate:  [44-97] 68 (03/26 0513) Resp:  [11-19] 16 (03/26 0513) BP: (120-167)/(57-94) 152/63 mmHg (03/26 0513) SpO2:  [99 %-100 %] 100 % (03/26 0513) Last BM Date: 07/28/14  Intake/Output from previous day: 03/25 0701 - 03/26 0700 In: 2413.3 [P.O.:220; I.V.:2193.3] Out: 5 [Urine:5] Intake/Output this shift:    PE: General- In NAD Abdomen-soft, mild-moderate RUQ and epigastric tenderness  Lab Results:   Recent Labs  07/28/14 1415 07/30/14 0436  WBC 4.3 4.0  HGB 12.2 11.2*  HCT 38.3 34.9*  PLT 200 193   BMET  Recent Labs  07/28/14 1415 07/30/14 0436  NA  --  138  K  --  3.8  CL  --  110  CO2  --  25  GLUCOSE  --  88  BUN  --  5*  CREATININE 0.81 0.73  CALCIUM  --  8.7   PT/INR  Recent Labs  07/30/14 0436  LABPROT 14.9  INR 1.15   Comprehensive Metabolic Panel:    Component Value Date/Time   NA 138 07/30/2014 0436   NA 140 07/28/2014 0738   K 3.8 07/30/2014 0436   K 3.4* 07/28/2014 0738   CL 110 07/30/2014 0436   CL 105 07/28/2014 0738   CO2 25 07/30/2014 0436   CO2 26 07/28/2014 0738   BUN 5* 07/30/2014 0436   BUN 8 07/28/2014 0738   CREATININE 0.73 07/30/2014 0436   CREATININE 0.81 07/28/2014 1415   CREATININE 0.70 07/04/2014 1042   CREATININE 0.71 06/04/2014 1311   GLUCOSE 88 07/30/2014 0436   GLUCOSE 104* 07/28/2014 0738   CALCIUM 8.7 07/30/2014 0436   CALCIUM 9.3 07/28/2014 0738   AST 21 07/28/2014 0738   AST 12 07/04/2014 1042   ALT 12 07/28/2014 0738   ALT <8 07/04/2014 1042   ALKPHOS 84 07/28/2014 0738   ALKPHOS 79 07/04/2014 1042   BILITOT 0.7 07/28/2014 0738   BILITOT 0.6 07/04/2014 1042   PROT 7.5 07/28/2014 0738   PROT 7.3 07/04/2014 1042   ALBUMIN 3.5 07/28/2014 0738   ALBUMIN 3.8 07/04/2014 1042      Studies/Results: Nm Myocar Multi W/spect W/wall Motion / Ef  07/29/2014   CLINICAL DATA:  54 year old female with a history of left-sided breast carcinoma, mitral valve prolapse, presents with chest pain.  Cardiovascular risk factors include hypertension.  EXAM: MYOCARDIAL IMAGING WITH SPECT (REST AND PHARMACOLOGIC-STRESS)  GATED LEFT VENTRICULAR WALL MOTION STUDY  LEFT VENTRICULAR EJECTION FRACTION  TECHNIQUE: Standard myocardial SPECT imaging was performed after resting intravenous injection of 30 mCi Tc-41m sestamibi. Subsequently, intravenous infusion of Lexiscan was performed under the supervision of the Cardiology staff. At peak effect of the drug, 30 mCi Tc-22m sestamibi was injected intravenously and standard myocardial SPECT imaging was performed. Quantitative gated imaging was also performed to evaluate left ventricular wall motion, and estimate left ventricular ejection fraction.  COMPARISON:  None.  FINDINGS: Perfusion: Separate small reversible defects are present at the inferior wall and anterior wall mid ventricle.  Wall Motion: Normal left ventricular wall motion. No left ventricular dilation.  Left Ventricular Ejection Fraction: 56 %  End diastolic volume 94 ml  End systolic volume 41 ml  Small focus of radiotracer present at the anterior left chest wall on the raw images.  IMPRESSION: 1. Separate small  reversible defects present at the anterior wall and inferior wall mid ventricle.  2. Normal left ventricular wall motion.  3. Left ventricular ejection fraction 56%  4. Intermediate-risk stress test findings*.  5. Raw images demonstrate a small focus of uptake of radiotracer at the anterior left wall in the region of left breast reconstruction. This could represent contamination, however, alternative differential diagnosis includes uptake in inflammation/infection or malignancy. Correlation with physical exam for contamination as well as any prior mammography is recommended.  *2012  Appropriate Use Criteria for Coronary Revascularization Focused Update: J Am Coll Cardiol. 1194;17(4):081-448. http://content.airportbarriers.com.aspx?articleid=1201161   Electronically Signed   By: Corrie Mckusick D.O.   On: 07/29/2014 12:22    Anti-infectives: Anti-infectives    Start     Dose/Rate Route Frequency Ordered Stop   07/28/14 1115  cefTRIAXone (ROCEPHIN) 2 g in dextrose 5 % 50 mL IVPB     2 g 100 mL/hr over 30 Minutes Intravenous Every 24 hours 07/28/14 1105        Assessment Chronic calculous cholecystitis with persistent symptoms-pain remains False positive Myoview stress test-cardiac cath negative   LOS: 3 days   Plan: Proceed with laparoscopic possible open cholecystectomy today.  I have explained the procedure, risks, and aftercare of cholecystectomy.  Risks include but are not limited to bleeding, infection, wound problems, anesthesia, diarrhea, bile leak, injury to common bile duct/liver/intestine.  She seems to understand and agrees with the plan.    Nikoleta Dady Lenna Sciara 07/31/2014

## 2014-07-31 NOTE — Transfer of Care (Signed)
Immediate Anesthesia Transfer of Care Note  Patient: Gail Schmitt  Procedure(s) Performed: Procedure(s): LAPAROSCOPIC CHOLECYSTECTOMY WITH INTRAOPERATIVE CHOLANGIOGRAM (N/A)  Patient Location: PACU  Anesthesia Type:General  Level of Consciousness: awake  Airway & Oxygen Therapy: Patient Spontanous Breathing and Patient connected to nasal cannula oxygen  Post-op Assessment: Report given to RN and Post -op Vital signs reviewed and stable  Post vital signs: Reviewed and stable  Last Vitals:  Filed Vitals:   07/31/14 0849  BP: 184/117  Pulse: 80  Temp: 36.7 C  Resp: 16    Complications: No apparent anesthesia complications

## 2014-07-31 NOTE — Anesthesia Postprocedure Evaluation (Signed)
  Anesthesia Post-op Note  Patient: Gail Schmitt  Procedure(s) Performed: Procedure(s) (LRB): LAPAROSCOPIC CHOLECYSTECTOMY WITH INTRAOPERATIVE CHOLANGIOGRAM (N/A)  Patient Location: PACU  Anesthesia Type: General  Level of Consciousness: awake and alert   Airway and Oxygen Therapy: Patient Spontanous Breathing  Post-op Pain: mild  Post-op Assessment: Post-op Vital signs reviewed, Patient's Cardiovascular Status Stable, Respiratory Function Stable, Patent Airway and No signs of Nausea or vomiting  Last Vitals:  Filed Vitals:   07/31/14 1131  BP: 163/83  Pulse: 70  Temp:   Resp: 12    Post-op Vital Signs: stable   Complications: No apparent anesthesia complications

## 2014-08-01 MED ORDER — HYDROCODONE-ACETAMINOPHEN 5-325 MG PO TABS: 1.0000 | ORAL_TABLET | ORAL | Status: DC | PRN

## 2014-08-01 NOTE — Discharge Summary (Signed)
Physician Discharge Summary  Patient ID: [name] MRN: [id number] DOB/AGE: 1961/03/09 54 y.o.  Admit date: 07/28/2014 Discharge date: 08/01/2014  Admission Diagnoses:  Acute calculous cholecystitis  Discharge Diagnoses:   Same   Chest pain   Pre-op evaluation   Abnormal nuclear stress test with negative cardiac catherization   Discharged Condition: good  Hospital Course: She was admitted on 3/23 and started on abxs.  Because of a history of exertional chest pain she had a preop cardiac evaluation.  Nuclear stress test was positive so cardiac catherization was performed which was negative, thus a false positive nuclear stress test.  She subsequently underwent laparoscopic cholecystectomy with IOC by Dr. Brantley Stage on 3/26 and tolerated it well.  She was able to be discharged on POD #1.  Discharge instructions were given to her.  Consults: cardiology  Significant Diagnostic Studies: nuclear medicine: Stress test.  Cardiac catherization.   Discharge Exam: Blood pressure 143/78, pulse 67, temperature 98.7 F (37.1 C), temperature source Oral, resp. rate 14, height 5\' 5"  (1.651 m), weight 123.9 kg (273 lb 2.4 oz), SpO2 100 %.   Disposition: 01-Home or Self Care     Medication List    TAKE these medications        ergocalciferol 50000 UNITS capsule  Commonly known as:  VITAMIN D2  Take 1 capsule (50,000 Units total) by mouth once a week.     HYDROcodone-acetaminophen 5-325 MG per tablet  Commonly known as:  NORCO/VICODIN  Take 1-2 tablets by mouth every 4 (four) hours as needed for moderate pain.     lisinopril-hydrochlorothiazide 20-25 MG per tablet  Commonly known as:  PRINZIDE,ZESTORETIC  Take 1 tablet by mouth every morning.     meloxicam 15 MG tablet  Commonly known as:  MOBIC  Take 0.5-1 tablets (7.5-15 mg total) by mouth daily.     omeprazole 40 MG capsule  Commonly known as:  PRILOSEC  Take 1 capsule (40 mg total) by mouth daily.     ondansetron 4 MG  disintegrating tablet  Commonly known as:  ZOFRAN ODT  Take 1 tablet (4 mg total) by mouth every 8 (eight) hours as needed for nausea.     verapamil 120 MG tablet  Commonly known as:  CALAN  Take 1 tablet (120 mg total) by mouth 2 (two) times daily.         Signed: Odis Hollingshead 08/01/2014, 8:19 AM

## 2014-08-01 NOTE — Discharge Instructions (Signed)
[facility] ______[facility] LAPAROSCOPIC GALLBLADDER SURGERY: POST OP INSTRUCTIONS Always review your discharge instruction sheet given to you by the facility where your surgery was performed. IF YOU HAVE DISABILITY OR FAMILY LEAVE FORMS, YOU MUST BRING THEM TO THE OFFICE FOR PROCESSING.   DO NOT GIVE THEM TO YOUR DOCTOR.  1. A prescription for pain medication may be given to you upon discharge.  Take your pain medication as prescribed, if needed.  If narcotic pain medicine is not needed, then you may take acetaminophen (Tylenol) or ibuprofen (Advil) as needed. 2. Take your usually prescribed medications unless otherwise directed. 3. If you need a refill on your pain medication, please contact your pharmacy.  They will contact our office to request authorization. Prescriptions will not be filled after 5pm or on week-ends. 4. You should follow a light diet the first few days after arrival home, such as soup and crackers, etc.  Be sure to include lots of fluids daily. 5. Most patients will experience some swelling and bruising in the area of the incisions.  Ice packs will help.  Swelling and bruising can take several days to resolve.  6. It is common to experience some constipation if taking pain medication after surgery.  Increasing fluid intake and taking a stool softener (such as Colace) will usually help or prevent this problem from occurring.  A mild laxative (Milk of Magnesia or Miralax) should be taken according to package instructions if there are no bowel movements after 48 hours. 7. Unless discharge instructions indicate otherwise, you may remove your bandages 24-48 hours after surgery, and you may shower at that time.  You may have steri-strips (small skin tapes) in place directly over the incision.  These strips should be left on the skin for 7-10 days.  If your surgeon used skin glue on the incision, you may shower in 24 hours.  The glue will flake off over the next 2-3 weeks.   Any sutures or staples will be removed at the office during your follow-up visit. 8. ACTIVITIES:  You may resume regular (light) daily activities beginning the next day--such as daily self-care, walking, climbing stairs--gradually increasing activities as tolerated.  You may have sexual intercourse when it is comfortable.  Refrain from any heavy lifting or straining-nothing over 10 pounds for 2 weeks.  a. You may drive when you are no longer taking prescription pain medication, you can comfortably wear a seatbelt, and you can safely maneuver your car and apply brakes. b. RETURN TO WORK:  Desk work in one week, full duty in 2 weeks.  Use this sheet as your work excuse.__________________________________________________________ 9. You should see your doctor (Dr. Brantley Stage or [facility])  in the office for a follow-up appointment approximately 2-3 weeks after your surgery.  Make sure that you call for this appointment within a day or two after you arrive home to insure a convenient appointment time. 10. OTHER INSTRUCTIONS: ___Use Pura Spice for itching is the pain medication causes you to itch._______________________________________________________________________________________________________________________ __________________________________________________________________________________________________________________________ WHEN TO CALL YOUR DOCTOR: 1. Fever over 101.0 2. Inability to urinate 3. Continued bleeding from incision. 4. Increased pain, redness, or drainage from the incision. 5. Increasing abdominal pain  The clinic staff is available to answer your questions during regular business hours.  Please dont hesitate to call and ask to speak to one of the nurses for clinical concerns.  If you have a medical emergency, go to the nearest emergency room or call 911.  A Psychologist, sport and exercise from [facility]  Surgery is always on call at the hospital. [address] [place], [place], [place]  [address] ?  [address], [place], [place]   [address] [phone] ? [phone] ? FAX [phone] Web site: [email or web address]

## 2014-08-01 NOTE — Progress Notes (Signed)
1 Day Post-Op  Subjective: Feels good this AM.  Preop symptoms are gone.  Objective: Vital signs in last 24 hours: Temp:  [97.7 F (36.5 C)-98.7 F (37.1 C)] 98.7 F (37.1 C) (03/27 0540) Pulse Rate:  [61-80] 67 (03/27 0540) Resp:  [9-16] 14 (03/27 0540) BP: (135-184)/(70-117) 143/78 mmHg (03/27 0540) SpO2:  [96 %-100 %] 100 % (03/27 0540) Last BM Date: 07/28/14  Intake/Output from previous day: 03/26 0701 - 03/27 0700 In: 2899.2 [P.O.:500; I.V.:2399.2] Out: 9 [Urine:4; Blood:5] Intake/Output this shift:    PE: General- In NAD Abdomen-soft, incisions are clean and intact  Lab Results:   Recent Labs  07/30/14 0436  WBC 4.0  HGB 11.2*  HCT 34.9*  PLT 193   BMET  Recent Labs  07/30/14 0436  NA 138  K 3.8  CL 110  CO2 25  GLUCOSE 88  BUN 5*  CREATININE 0.73  CALCIUM 8.7   PT/INR  Recent Labs  07/30/14 0436  LABPROT 14.9  INR 1.15   Comprehensive Metabolic Panel:    Component Value Date/Time   NA 138 07/30/2014 0436   NA 140 07/28/2014 0738   K 3.8 07/30/2014 0436   K 3.4* 07/28/2014 0738   CL 110 07/30/2014 0436   CL 105 07/28/2014 0738   CO2 25 07/30/2014 0436   CO2 26 07/28/2014 0738   BUN 5* 07/30/2014 0436   BUN 8 07/28/2014 0738   CREATININE 0.73 07/30/2014 0436   CREATININE 0.81 07/28/2014 1415   CREATININE 0.70 07/04/2014 1042   CREATININE 0.71 06/04/2014 1311   GLUCOSE 88 07/30/2014 0436   GLUCOSE 104* 07/28/2014 0738   CALCIUM 8.7 07/30/2014 0436   CALCIUM 9.3 07/28/2014 0738   AST 21 07/28/2014 0738   AST 12 07/04/2014 1042   ALT 12 07/28/2014 0738   ALT <8 07/04/2014 1042   ALKPHOS 84 07/28/2014 0738   ALKPHOS 79 07/04/2014 1042   BILITOT 0.7 07/28/2014 0738   BILITOT 0.6 07/04/2014 1042   PROT 7.5 07/28/2014 0738   PROT 7.3 07/04/2014 1042   ALBUMIN 3.5 07/28/2014 0738   ALBUMIN 3.8 07/04/2014 1042     Studies/Results: Dg Cholangiogram Operative  07/31/2014   CLINICAL DATA:  54 year old female with a history of  cholecystectomy.  EXAM: INTRAOPERATIVE CHOLANGIOGRAM  Fluoroscopy: 12.7 seconds  COMPARISON:  Ultrasound 06/23/2014  FINDINGS: Intraoperative fluoroscopic spot images of the upper abdomen during laparoscopic cholecystectomy and cholangiogram.  Images demonstrate surgical instruments of the right upper abdomen. Cannulation of the cystic duct with antegrade infusion of contrast.  Cystic duct is patent, with partial opacification of the common hepatic duct and common bile duct.  Final images demonstrate slight amount of contrast traversing the ampulla.  Extraluminal contrast at the inferior liver margin.  Extrahepatic biliary ducts unremarkable in size/diameter.  IMPRESSION: Intraoperative cholangiogram demonstrates small amount of contrast traversing the ampulla with no large filling defect identified.  Please refer to the dictated operative report for full details of intraoperative findings and procedure.  Signed,  Dulcy Fanny. Earleen Newport, DO  [facility]  [facility]   Electronically Signed   By: Corrie Mckusick D.O.   On: 07/31/2014 11:05    Anti-infectives: Anti-infectives    Start     Dose/Rate Route Frequency Ordered Stop   07/28/14 1115  cefTRIAXone (ROCEPHIN) 2 g in dextrose 5 % 50 mL IVPB     2 g 100 mL/hr over 30 Minutes Intravenous Every 24 hours 07/28/14 1105  Assessment Acute calculous cholecystitis s/p lap chole 3/26-doing well    LOS: 4 days   Plan: Discharge.  Instructions given.   Naser Schuld J 08/01/2014

## 2014-08-02 ENCOUNTER — Encounter (HOSPITAL_COMMUNITY): Payer: Self-pay | Admitting: Surgery

## 2014-08-20 ENCOUNTER — Ambulatory Visit (INDEPENDENT_AMBULATORY_CARE_PROVIDER_SITE_OTHER): Payer: 59 | Admitting: Family Medicine

## 2014-08-20 ENCOUNTER — Encounter: Payer: Self-pay | Admitting: Family Medicine

## 2014-08-20 VITALS — BP 135/87 | HR 76 | Temp 97.2°F | Resp 16 | Ht 65.0 in | Wt 267.0 lb

## 2014-08-20 DIAGNOSIS — B354 Tinea corporis: Secondary | ICD-10-CM | POA: Diagnosis not present

## 2014-08-20 DIAGNOSIS — B351 Tinea unguium: Secondary | ICD-10-CM | POA: Diagnosis not present

## 2014-08-20 DIAGNOSIS — I10 Essential (primary) hypertension: Secondary | ICD-10-CM

## 2014-08-20 DIAGNOSIS — E559 Vitamin D deficiency, unspecified: Secondary | ICD-10-CM | POA: Diagnosis not present

## 2014-08-20 DIAGNOSIS — N898 Other specified noninflammatory disorders of vagina: Secondary | ICD-10-CM

## 2014-08-20 DIAGNOSIS — K5900 Constipation, unspecified: Secondary | ICD-10-CM | POA: Diagnosis not present

## 2014-08-20 DIAGNOSIS — Z1272 Encounter for screening for malignant neoplasm of vagina: Secondary | ICD-10-CM

## 2014-08-20 DIAGNOSIS — B353 Tinea pedis: Secondary | ICD-10-CM

## 2014-08-20 LAB — POCT WET PREP WITH KOH
CLUE CELLS WET PREP PER HPF POC: NEGATIVE
KOH Prep POC: NEGATIVE
TRICHOMONAS UA: NEGATIVE
WBC Wet Prep HPF POC: NEGATIVE
Yeast Wet Prep HPF POC: NEGATIVE

## 2014-08-20 LAB — POCT URINALYSIS DIPSTICK
BILIRUBIN UA: NEGATIVE
GLUCOSE UA: NEGATIVE
KETONES UA: NEGATIVE
Leukocytes, UA: NEGATIVE
Nitrite, UA: NEGATIVE
Protein, UA: NEGATIVE
RBC UA: NEGATIVE
SPEC GRAV UA: 1.02
Urobilinogen, UA: 0.2
pH, UA: 5

## 2014-08-20 MED ORDER — POLYETHYLENE GLYCOL 3350 17 GM/SCOOP PO POWD: 17.0000 g | Freq: Every day | ORAL | Status: DC

## 2014-08-20 MED ORDER — TERBINAFINE HCL 250 MG PO TABS
250.0000 mg | ORAL_TABLET | Freq: Every day | ORAL | Status: DC
Start: 2014-08-20 — End: 2014-12-20

## 2014-08-20 NOTE — Patient Instructions (Signed)
Constipation  Constipation is when a person has fewer than three bowel movements a week, has difficulty having a bowel movement, or has stools that are dry, hard, or larger than normal. As people grow older, constipation is more common. If you try to fix constipation with medicines that make you have a bowel movement (laxatives), the problem may get worse. Long-term laxative use may cause the muscles of the colon to become weak. A low-fiber diet, not taking in enough fluids, and taking certain medicines may make constipation worse.   CAUSES   · Certain medicines, such as antidepressants, pain medicine, iron supplements, antacids, and water pills.    · Certain diseases, such as diabetes, irritable bowel syndrome (IBS), thyroid disease, or depression.    · Not drinking enough water.    · Not eating enough fiber-rich foods.    · Stress or travel.    · Lack of physical activity or exercise.    · Ignoring the urge to have a bowel movement.    · Using laxatives too much.    SIGNS AND SYMPTOMS   · Having fewer than three bowel movements a week.    · Straining to have a bowel movement.    · Having stools that are hard, dry, or larger than normal.    · Feeling full or bloated.    · Pain in the lower abdomen.    · Not feeling relief after having a bowel movement.    DIAGNOSIS   Your health care provider will take a medical history and perform a physical exam. Further testing may be done for severe constipation. Some tests may include:  · A barium enema X-ray to examine your rectum, colon, and, sometimes, your small intestine.    · A sigmoidoscopy to examine your lower colon.    · A colonoscopy to examine your entire colon.  TREATMENT   Treatment will depend on the severity of your constipation and what is causing it. Some dietary treatments include drinking more fluids and eating more fiber-rich foods. Lifestyle treatments may include regular exercise. If these diet and lifestyle recommendations do not help, your health care  provider may recommend taking over-the-counter laxative medicines to help you have bowel movements. Prescription medicines may be prescribed if over-the-counter medicines do not work.   HOME CARE INSTRUCTIONS   · Eat foods that have a lot of fiber, such as fruits, vegetables, whole grains, and beans.  · Limit foods high in fat and processed sugars, such as french fries, hamburgers, cookies, candies, and soda.    · A fiber supplement may be added to your diet if you cannot get enough fiber from foods.    · Drink enough fluids to keep your urine clear or pale yellow.    · Exercise regularly or as directed by your health care provider.    · Go to the restroom when you have the urge to go. Do not hold it.    · Only take over-the-counter or prescription medicines as directed by your health care provider. Do not take other medicines for constipation without talking to your health care provider first.    SEEK IMMEDIATE MEDICAL CARE IF:   · You have bright red blood in your stool.    · Your constipation lasts for more than 4 days or gets worse.    · You have abdominal or rectal pain.    · You have thin, pencil-like stools.    · You have unexplained weight loss.  MAKE SURE YOU:   · Understand these instructions.  · Will watch your condition.  · Will get help right away if you are not   you have with your health care provider.   \About Constipation  Constipation Overview Constipation is the most common gastrointestinal complaint - about 4 million Americans experience constipation and make 2.5 million physician visits a year to get help for the problem.  Constipation can occur when the colon absorbs too much water, the colon's  muscle contraction is slow or sluggish, and/or there is delayed transit time through the colon.  The result is stool that is hard and dry.  Indicators of constipation include straining during bowel movements greater than 25% of the time, having fewer than three bowel movements per week, and/or the feeling of incomplete evacuation.  There are established guidelines ([place] II ) for defining constipation. A person needs to have two or more of the following symptoms for at least 12 weeks (not necessarily consecutive) in the preceding 12 months: . Straining in  greater than 25% of bowel movements . Lumpy or hard stools in greater than 25% of bowel movements . Sensation of incomplete emptying in greater than 25% of bowel movements . Sensation of anorectal obstruction/blockade in greater than 25% of bowel movements . Manual maneuvers to help empty greater than 25% of bowel movements (e.g., digital evacuation, support of the pelvic floor)  . Less than  3 bowel movements/week . Loose stools are not present, and criteria for irritable bowel syndrome are insufficient  Common Causes of Constipation . Lack of fiber in your diet . Lack of physical activity . Medications, including iron and calcium supplements  . Dairy intake . Dehydration . Abuse of laxatives  Travel  Irritable Bowel Syndrome  Pregnancy  Luteal phase of menstruation (after ovulation and before menses)  Colorectal problems  Intestinal Dysfunction  Treating Constipation  There are several ways of treating constipation, including changes to diet and exercise, use of laxatives, adjustments to the pelvic floor, and scheduled toileting.  These treatments include: . increasing fiber and fluids in the diet  . increasing physical activity . learning muscle coordination   learning proper toileting techniques and toileting modifications   designing and sticking  to a toileting schedule     2007, [employer] Doc.22

## 2014-08-20 NOTE — Progress Notes (Addendum)
Subjective:    Patient ID: Gail Schmitt, female    DOB: 01-27-1961, 54 y.o.   MRN: [id number] This chart was scribed for Delman Cheadle, MD by Zola Button, Medical Scribe. This patient was seen in Room 24 and the patient's care was started at 2:00 PM.  Chief Complaint  Patient presents with  . pap only    HPI HPI Comments: Gail Schmitt is a 54 y.o. female with a hx of unstable angina pectoris and obesity who presents to the [facility] for a pap smear.  I have seen her once, patient of mine several years ago. Reestablished a few months ago. She was having several issues including chest pains and concerns for gallbladder. She was scheduled to see a surgeon for cholecystectomy, but then the day prior, had severe pain and went to the ER. Was planned to have a cholecystectomy and during that surgical evaluation, she developed left chest pain, anginal in nature, seen by Dr. Burt Knack, cardiology. She had a negative workup in 2013, but was recommended to have repeat stress test prior to surgery. Her stress test was intermediate, so they proceeded with cardiac cath followed by cholecystectomy. She did well after the surgery and no further follow-up was indicated.  Cholecystectomy: She states she has been doing well since her surgery. Patient states she has been having constipation more than diarrhea. She notes that sweet foods have been passing through her body quickly. She still has some left lower chest pain.  Rash: She notes that she did notice a circular rash to her left chest which initially turned red, followed by skin peeling. She also noticed similar rashes on her fingers. No pain or itchiness to the rash areas per patient. She takes showers and does not take sit-down baths. Patient denies swimming, douching or washing in her vagina. She notes using coconut oil based soap and does not do well with scented soaps. She states she has not had any sexual activity in over a year. PSHx  includes complete hysterectomy and oophorectomy.  Vaginal Discharge: Patient reports having some vaginal discharge with a "turpentine" odor that started before her surgery. She had been put on antibiotics before the surgery, which seemed to resolve her vaginal discharge. However, since the surgery, she has noticed that the vaginal discharge has started to return.  Past Medical History  Diagnosis Date  . Hypertension   . Obesity   . Dermatomyositis     a. noted prior to diagnosis of Breast CA ("i'm allergic to cancer")  . Heart murmur   . Family history of adverse reaction to anesthesia     "all my daughters get real sick and throw up alot"  . Unstable angina     a. Cath ~ 10 + yrs ago, [facility], [place] - "normal but pressures were high."  . Mitral valve prolapse   . Pulmonary embolism ~ 2003  . Pneumonia     "haven't had it in the last 4 yrs; before that I had it q yr for 4-5 years" (07/28/2014)  . Dermatomyositis   . Headache     "maybe monthly" (07/28/2014)  . Arthritis     "left hip; hands" (07/28/2014)  . Breast cancer, left breast 1999    a. s/p left mastectomy  . Uterine cancer 2007   Past Surgical History  Procedure Laterality Date  . Laparoscopic total hysterectomy    . Mastectomy Left 1999  . Left heart catheterization with coronary angiogram N/A  07/13/2011    Procedure: LEFT HEART CATHETERIZATION WITH CORONARY ANGIOGRAM;  Surgeon: Josue Hector, MD;  Location[facility] C[facility] LAB;  Service: Cardiovascular;  Laterality: N/A;  . Cardiac catheterization  07/13/2011  . Breast biopsy Left 1999  . Reconstruction breast immediate / delayed w/ tissue expander Left 2005  . Inguinal hernia repair Left 1986  . Total abdominal hysterectomy  2007  . Tubal ligation  1990  . Left heart catheterization with coronary angiogram N/A 07/30/2014    Procedure: LEFT HEART CATHETERIZATION WITH CORONARY ANGIOGRAM;  Surgeon: [name], MD;  Location: [facility] CATH LAB;  Service: Cardiovascular;   Laterality: N/A;  . Cholecystectomy N/A 07/31/2014    Procedure: LAPAROSCOPIC CHOLECYSTECTOMY WITH INTRAOPERATIVE CHOLANGIOGRAM;  Surgeon: Erroll Luna, MD;  Location: [facility];  Service: General;  Laterality: N/A;   Current Outpatient Prescriptions on File Prior to Visit  Medication Sig Dispense Refill  . ergocalciferol (VITAMIN D2) 50000 UNITS capsule Take 1 capsule (50,000 Units total) by mouth once a week. 12 capsule 1  . lisinopril-hydrochlorothiazide (PRINZIDE,ZESTORETIC) 20-25 MG per tablet Take 1 tablet by mouth every morning. 90 tablet 1  . verapamil (CALAN) 120 MG tablet Take 1 tablet (120 mg total) by mouth 2 (two) times daily. 180 tablet 1  . [DISCONTINUED] lisinopril (PRINIVIL,ZESTRIL) 10 MG tablet Take 10 mg by mouth daily.       No current facility-administered medications on file prior to visit.   Allergies  Allergen Reactions  . Percocet [Oxycodone-Acetaminophen] Itching and Rash   Family History  Problem Relation Age of Onset  . Diabetes type II Mother     alive @ 27  . Diabetes Sister   . Stroke Brother    History   Social History  . Marital Status: Divorced    Spouse Name: N/A  . Number of Children: N/A  . Years of Education: N/A   Social History Main Topics  . Smoking status: Never Smoker   . Smokeless tobacco: Never Used  . Alcohol Use: 0.6 oz/week    1 Standard drinks or equivalent per week     Comment: 07/28/2014 "might have 1 drink a couple times/month"  . Drug Use: No  . Sexual Activity: Not Currently   Other Topics Concern  . None   Social History Narrative   Lives @ home in [place] with 1 of her 3 dtrs.  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  Doesn't follow any particular diet.     Review of Systems  Constitutional: Negative for fever, chills, diaphoresis, activity change, appetite change and unexpected weight change.  Respiratory: Negative for cough, chest tightness, shortness of breath  and wheezing.   Cardiovascular: Positive for chest pain and leg swelling. Negative for palpitations.  Gastrointestinal: Positive for diarrhea and constipation.  Endocrine: Negative for polydipsia, polyphagia and polyuria.  Genitourinary: Positive for vaginal discharge. Negative for dysuria, vaginal bleeding, menstrual problem and pelvic pain.  Musculoskeletal: Positive for arthralgias. Negative for myalgias.  Skin: Positive for rash. Negative for color change.  Hematological: Negative for adenopathy.  Psychiatric/Behavioral: Negative for dysphoric mood. The patient is not nervous/anxious.        Objective:  BP 135/87 mmHg  Pulse 76  Temp(Src) 97.2 F (36.2 C) (Oral)  Resp 16  Ht 5\' 5"  (1.651 m)  Wt 267 lb (121.11 kg)  BMI 44.43 kg/m2  SpO2 100%  Physical Exam  Constitutional: She is oriented to person, place, and time. She  appears well-developed and well-nourished. No distress.  HENT:  Head: Normocephalic and atraumatic.  Mouth/Throat: Oropharynx is clear and moist. No oropharyngeal exudate.  Eyes: Pupils are equal, round, and reactive to light.  Neck: Neck supple.  Cardiovascular: Normal rate.   Pulmonary/Chest: Effort normal.  Genitourinary: Vagina normal.  Large amount of stool in colon. Vaginal vault and external labia normal. Cervix and uterus surgically absent.  Musculoskeletal: She exhibits no edema.  Neurological: She is alert and oriented to person, place, and time. No cranial nerve deficit.  Skin: Skin is warm and dry. Rash noted.  Scaly, white rash on her feet. Thickened, cracking toenails. Rough, hypopigmented macular rash with central clearing.  Psychiatric: She has a normal mood and affect. Her behavior is normal.  Vitals reviewed.  Results for orders placed or performed in visit on 08/20/14  POCT urinalysis dipstick  Result Value Ref Range   Color, UA Amber    Clarity, UA Clear    Glucose, UA Negative    Bilirubin, UA Negative    Ketones, UA Negative     Spec Grav, UA 1.020    Blood, UA Negative    pH, UA 5.0    Protein, UA Negative    Urobilinogen, UA 0.2    Nitrite, UA Negative    Leukocytes, UA Negative   POCT Wet Prep with KOH  Result Value Ref Range   Trichomonas, UA Negative    Clue Cells Wet Prep HPF POC Negative    Epithelial Wet Prep HPF POC 0-2    Yeast Wet Prep HPF POC Negative    Bacteria Wet Prep HPF POC 1+    RBC Wet Prep HPF POC 0-2    WBC Wet Prep HPF POC Negative    KOH Prep POC Negative   Pap IG, CT/NG NAA, and HPV (high risk)  Result Value Ref Range   HPV DNA High Risk     Specimen adequacy: SEE NOTE    FINAL DIAGNOSIS: SEE NOTE    Cytotechnologist: SEE NOTE    Chlamydia Probe Amp     GC Probe Amp          Assessment & Plan:  Patient to return in 6-8 weeks for LFTs since starting lamisil today.  1. Screening for vaginal cancer - pap only today - sched for CPE visit.  2. Tinea corporis   3. Tinea pedis of both feet   4. Onychomycosis - start lamisil since several sources of recurrent yeast infxns.  5. Constipation, unspecified constipation type  - start daily miralax  6. Vaginal discharge   7. Essential hypertension, benign   8. Vitamin D deficiency     Orders Placed This Encounter  Procedures  . POCT urinalysis dipstick  . POCT Wet Prep with KOH    Meds ordered this encounter  Medications  . terbinafine (LAMISIL) 250 MG tablet    Sig: Take 1 tablet (250 mg total) by mouth daily.    Dispense:  90 tablet    Refill:  1  . polyethylene glycol powder (GLYCOLAX/MIRALAX) powder    Sig: Take 17 g by mouth daily.    Dispense:  500 g    Refill:  11    I personally performed the services described in this documentation, which was scribed in my presence. The recorded information has been reviewed and considered, and addended by me as needed.  Delman Cheadle, MD MPH

## 2014-08-24 LAB — PAP IG, CT-NG NAA, HPV HIGH-RISK
Chlamydia Probe Amp: NEGATIVE
GC PROBE AMP: NEGATIVE
HPV DNA High Risk: NOT DETECTED

## 2014-09-21 ENCOUNTER — Encounter: Payer: Self-pay | Admitting: *Deleted

## 2014-09-21 DIAGNOSIS — K8020 Calculus of gallbladder without cholecystitis without obstruction: Secondary | ICD-10-CM

## 2014-10-03 ENCOUNTER — Other Ambulatory Visit: Payer: Self-pay | Admitting: Family Medicine

## 2014-10-08 ENCOUNTER — Ambulatory Visit: Payer: 59 | Admitting: Family Medicine

## 2014-10-15 ENCOUNTER — Ambulatory Visit (INDEPENDENT_AMBULATORY_CARE_PROVIDER_SITE_OTHER): Payer: 59 | Admitting: Family Medicine

## 2014-10-15 ENCOUNTER — Encounter: Payer: Self-pay | Admitting: Family Medicine

## 2014-10-15 VITALS — BP 123/85 | HR 73 | Temp 98.2°F | Resp 16 | Ht 66.5 in | Wt 267.4 lb

## 2014-10-15 DIAGNOSIS — Z1211 Encounter for screening for malignant neoplasm of colon: Secondary | ICD-10-CM

## 2014-10-15 DIAGNOSIS — K5900 Constipation, unspecified: Secondary | ICD-10-CM

## 2014-10-15 DIAGNOSIS — E559 Vitamin D deficiency, unspecified: Secondary | ICD-10-CM | POA: Diagnosis not present

## 2014-10-15 DIAGNOSIS — I10 Essential (primary) hypertension: Secondary | ICD-10-CM

## 2014-10-15 DIAGNOSIS — K760 Fatty (change of) liver, not elsewhere classified: Secondary | ICD-10-CM | POA: Diagnosis not present

## 2014-10-15 DIAGNOSIS — B351 Tinea unguium: Secondary | ICD-10-CM

## 2014-10-15 DIAGNOSIS — Z79899 Other long term (current) drug therapy: Secondary | ICD-10-CM | POA: Diagnosis not present

## 2014-10-15 LAB — HEPATIC FUNCTION PANEL
ALK PHOS: 90 U/L (ref 39–117)
ALT: 8 U/L (ref 0–35)
AST: 17 U/L (ref 0–37)
Albumin: 3.8 g/dL (ref 3.5–5.2)
BILIRUBIN TOTAL: 0.3 mg/dL (ref 0.2–1.2)
Bilirubin, Direct: 0.1 mg/dL (ref 0.0–0.3)
Indirect Bilirubin: 0.2 mg/dL (ref 0.2–1.2)
Total Protein: 7.3 g/dL (ref 6.0–8.3)

## 2014-10-15 NOTE — Progress Notes (Signed)
Subjective:  This chart was scribed for Delman Cheadle, MD by Moises Blood, Medical Scribe. This patient was seen in Room 22 and the patient's care was started 2:26 PM.    Patient ID: Gail Schmitt, female    DOB: 1960-08-28, 54 y.o.   MRN: [id number] Chief Complaint  Patient presents with  . Follow-up    CONSTIPATION    HPI Gail Schmitt is a 54 y.o. female who presents to [facility] for follow up.  Pt was seen for full physical 2 months ago.  She was started on Miralax for constipation.  She was started on Lamisil for toenail fungus.   She reports that the Lamisil has been beneficial and showed that the toenails have improved.  She has no problems with the medication.   She also reports the Miralax has been beneficial. She has also increased vegetable intake and drinking more water.  She walks 3 days a week with increased distance.   She has not had a colonoscopy.  She had a hysterectomy due to history of uterine cancer in 2007. She had a pap smear back in 2007.   Previously, she had a rash in the LUQ and it has went away.  She reports some pain under her armpits. 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   She was on Vitamin D for 1 week, and wasn't sure if she needed to continue. She didn't have problems with it. Didn't think she would comply with daily vitamin so she will restart her prescription for Vitamin D for additional 5 months.      Past Medical History  Diagnosis Date  . Hypertension   . Obesity   . Dermatomyositis     a. noted prior to diagnosis of Breast CA ("i'm allergic to cancer")  . Heart murmur   . Family history of adverse reaction to anesthesia     "all my daughters get real sick and throw up alot"  . Unstable angina     a. Cath ~ 10 + yrs ago, [facility], [place] - "normal but pressures were high."  . Mitral valve prolapse   . Pulmonary embolism ~ 2003  . Pneumonia     "haven't had it in the last 4 yrs; before that I had it  q yr for 4-5 years" (07/28/2014)  . Dermatomyositis   . Headache     "maybe monthly" (07/28/2014)  . Arthritis     "left hip; hands" (07/28/2014)  . Breast cancer, left breast 1999    a. s/p left mastectomy  . Uterine cancer 2007   Current Outpatient Prescriptions on File Prior to Visit  Medication Sig Dispense Refill  . lisinopril-hydrochlorothiazide (PRINZIDE,ZESTORETIC) 20-25 MG per tablet TAKE ONE TABLET BY MOUTH EVERY MORNING 90 tablet 0  . polyethylene glycol powder (GLYCOLAX/MIRALAX) powder Take 17 g by mouth daily. 500 g 11  . terbinafine (LAMISIL) 250 MG tablet Take 1 tablet (250 mg total) by mouth daily. 90 tablet 1  . verapamil (CALAN) 120 MG tablet Take 1 tablet (120 mg total) by mouth 2 (two) times daily. 180 tablet 1  . ergocalciferol (VITAMIN D2) 50000 UNITS capsule Take 1 capsule (50,000 Units total) by mouth once a week. (Patient not taking: Reported on 10/15/2014) 12 capsule 1  . [DISCONTINUED] lisinopril (PRINIVIL,ZESTRIL) 10 MG tablet Take 10 mg by mouth daily.       No current facility-administered medications on file prior to visit.   Allergies  Allergen Reactions  .  Percocet [Oxycodone-Acetaminophen] Itching and Rash      Review of Systems  Constitutional: Positive for activity change and appetite change. Negative for fever, chills, diaphoresis, fatigue and unexpected weight change.  HENT: Negative for congestion.   Eyes: Negative for visual disturbance.  Respiratory: Negative for cough and shortness of breath.   Cardiovascular: Negative for chest pain, palpitations and leg swelling.  Gastrointestinal: Negative for nausea, vomiting, abdominal pain, diarrhea, constipation, blood in stool, abdominal distention, anal bleeding and rectal pain.  Genitourinary: Negative for decreased urine volume, vaginal bleeding and vaginal discharge.  Musculoskeletal: Positive for myalgias and arthralgias. Negative for joint swelling and gait problem.  Skin: Negative for rash and  wound.  Neurological: Negative for syncope and headaches.  Hematological: Negative for adenopathy. Does not bruise/bleed easily.       Objective:   Physical Exam  Constitutional: She is oriented to person, place, and time. She appears well-developed and well-nourished. No distress.  HENT:  Head: Normocephalic and atraumatic.  Eyes: EOM are normal. Pupils are equal, round, and reactive to light.  Neck: Neck supple. No thyromegaly present.  Cardiovascular: Normal rate, regular rhythm, S1 normal, S2 normal and normal heart sounds.   Pulmonary/Chest: Effort normal and breath sounds normal. No respiratory distress.  Musculoskeletal: Normal range of motion.  Lymphadenopathy:    She has no cervical adenopathy.  Neurological: She is alert and oriented to person, place, and time.  Skin: Skin is warm and dry.  Psychiatric: She has a normal mood and affect. Her behavior is normal.  Nursing note and vitals reviewed.   BP 123/85 mmHg  Pulse 73  Temp(Src) 98.2 F (36.8 C) (Oral)  Resp 16  Ht 5' 6.5" (1.689 m)  Wt 267 lb 6.4 oz (121.292 kg)  BMI 42.52 kg/m2  SpO2 98%       Assessment & Plan:   1. Polypharmacy   2. Onychomycosis of toenail - LFTs nml so cont lamisil until sxs completely resolved.  3. Vitamin D deficiency - restart vit D supp  4. Fatty infiltration of liver   5. Constipation, unspecified constipation type - cont miralax  6. Special screening for malignant neoplasms, colon   7.      HTN - well controlled; have pharmacy request refill of lisinopril-hctz and verapamil when do and send in yr refill.  Orders Placed This Encounter  Procedures  . Hepatic Function Panel  . Ambulatory referral to Gastroenterology    Referral Priority:  Routine    Referral Type:  Consultation    Referral Reason:  Specialty Services Required    Number of Visits Requested:  1    Meds ordered this encounter  Medications  . VITAMIN D, ERGOCALCIFEROL, PO    Sig: Take by mouth.    I  personally performed the services described in this documentation, which was scribed in my presence. The recorded information has been reviewed and considered, and addended by me as needed.  Delman Cheadle, MD MPH  Results for orders placed or performed in visit on 10/15/14  Hepatic Function Panel  Result Value Ref Range   Total Bilirubin 0.3 0.2 - 1.2 mg/dL   Bilirubin, Direct 0.1 0.0 - 0.3 mg/dL   Indirect Bilirubin 0.2 0.2 - 1.2 mg/dL   Alkaline Phosphatase 90 39 - 117 U/L   AST 17 0 - 37 U/L   ALT 8 0 - 35 U/L   Total Protein 7.3 6.0 - 8.3 g/dL   Albumin 3.8 3.5 - 5.2 g/dL

## 2014-10-15 NOTE — Patient Instructions (Signed)

## 2014-10-18 ENCOUNTER — Encounter: Payer: Self-pay | Admitting: Internal Medicine

## 2014-10-20 ENCOUNTER — Encounter: Payer: Self-pay | Admitting: Family Medicine

## 2014-10-22 DIAGNOSIS — K5900 Constipation, unspecified: Secondary | ICD-10-CM | POA: Insufficient documentation

## 2014-11-18 ENCOUNTER — Telehealth: Payer: Self-pay

## 2014-11-18 NOTE — Telephone Encounter (Signed)
Patient of Dr. Brigitte Pulse. States her insurance company changed which pharmacy she can use for her scripts. She used to use [facility] and now the insurance says she has to use [facility] on [address] starting July 1. She needs refills on verapamil and lisinopril-HCTZ BP meds, especially the verapamil. Can we send refills to new pharmacy? Cb# [id number].

## 2014-11-19 MED ORDER — VERAPAMIL HCL 120 MG PO TABS: 120.0000 mg | ORAL_TABLET | Freq: Two times a day (BID) | ORAL | Status: DC

## 2014-11-19 MED ORDER — LISINOPRIL-HYDROCHLOROTHIAZIDE 20-25 MG PO TABS: 1.0000 | ORAL_TABLET | Freq: Every morning | ORAL | Status: DC

## 2014-11-19 NOTE — Telephone Encounter (Signed)
Sent in 1 yr RFs of both per Dr Raul Del OV notes. Had operator advise pt I am sending RFs now.

## 2014-12-20 ENCOUNTER — Ambulatory Visit (AMBULATORY_SURGERY_CENTER): Payer: 59

## 2014-12-20 VITALS — Ht 65.5 in | Wt 258.0 lb

## 2014-12-20 DIAGNOSIS — Z8371 Family history of colonic polyps: Secondary | ICD-10-CM

## 2014-12-20 DIAGNOSIS — Z83719 Family history of colon polyps, unspecified: Secondary | ICD-10-CM

## 2014-12-20 MED ORDER — SUPREP BOWEL PREP KIT 17.5-3.13-1.6 GM/177ML PO SOLN: 1.0000 | Freq: Once | ORAL | Status: DC

## 2014-12-20 NOTE — Progress Notes (Signed)
No allergies to eggs or soy No past problems with anesthesia; daughters have PONV with general anesthesia No diet/weight loss meds No home oxygen  Has email  Emmi instructions given for colonoscopy

## 2015-01-03 ENCOUNTER — Ambulatory Visit (AMBULATORY_SURGERY_CENTER): Payer: 59 | Admitting: Internal Medicine

## 2015-01-03 ENCOUNTER — Encounter: Payer: Self-pay | Admitting: Internal Medicine

## 2015-01-03 VITALS — BP 129/64 | HR 53 | Temp 97.2°F | Resp 20 | Ht 65.5 in | Wt 258.0 lb

## 2015-01-03 DIAGNOSIS — Z1211 Encounter for screening for malignant neoplasm of colon: Secondary | ICD-10-CM | POA: Diagnosis present

## 2015-01-03 DIAGNOSIS — D120 Benign neoplasm of cecum: Secondary | ICD-10-CM

## 2015-01-03 DIAGNOSIS — K635 Polyp of colon: Secondary | ICD-10-CM | POA: Diagnosis not present

## 2015-01-03 DIAGNOSIS — D127 Benign neoplasm of rectosigmoid junction: Secondary | ICD-10-CM

## 2015-01-03 DIAGNOSIS — D122 Benign neoplasm of ascending colon: Secondary | ICD-10-CM

## 2015-01-03 DIAGNOSIS — D125 Benign neoplasm of sigmoid colon: Secondary | ICD-10-CM | POA: Diagnosis not present

## 2015-01-03 MED ORDER — SODIUM CHLORIDE 0.9 % IV SOLN: 500.0000 mL | INTRAVENOUS | Status: DC

## 2015-01-03 NOTE — Op Note (Signed)
[facility]  [address] [place], [id number]   COLONOSCOPY PROCEDURE REPORT  PATIENT: Gail, Schmitt  MR#: [id number] BIRTHDATE: Oct 09, 1960 , 28  yrs. old GENDER: female ENDOSCOPIST: Jerene Bears, MD REFERRED Katherina Mires, M.D. PROCEDURE DATE:  01/03/2015 PROCEDURE:   Colonoscopy, screening, Colonoscopy with snare polypectomy, and Colonoscopy with cold biopsy polypectomy First Screening Colonoscopy - Avg.  risk and is 50 yrs.  old or older Yes.  Prior Negative Screening - Now for repeat screening. N/A  History of Adenoma - Now for follow-up colonoscopy & has been > or = to 3 yrs.  N/A  Polyps removed today? Yes ASA CLASS:   Class III INDICATIONS:Screening for colonic neoplasia and Colorectal Neoplasm Risk Assessment for this procedure is average risk. MEDICATIONS: Monitored anesthesia care and Propofol 350 mg IV  DESCRIPTION OF PROCEDURE:   After the risks benefits and alternatives of the procedure were thoroughly explained, informed consent was obtained.  The digital rectal exam revealed no rectal mass.   The LB [id number]  endoscope was introduced through the anus and advanced to the cecum, which was identified by both the appendix and ileocecal valve. No adverse events experienced. The quality of the prep was good.  (Suprep was used)  The instrument was then slowly withdrawn as the colon was fully examined. Estimated blood loss is zero unless otherwise noted in this procedure report.   COLON FINDINGS: Two sessile polyps ranging from 2 to 24mm in size were found at the ileocecal valve and in the ascending colon. Polypectomies were performed with cold forceps.  The resection was complete, the polyp tissue was completely retrieved and sent to histology.   Two sessile polyps ranging from 5 to 40mm in size were found in the ascending colon.  Polypectomies were performed with a cold snare.  The resection was complete, the polyp tissue was completely  retrieved and sent to histology.   Three sessile polyps ranging from 3 to 32mm in size were found in the sigmoid colon (1) and rectosigmoid colon (2).  Polypectomies were performed with a cold snare (2) and with cold forceps (1).  The resection was complete, the polyp tissue was completely retrieved and sent to histology.  Retroflexed views revealed no abnormalities. The time to cecum = 4.1 Withdrawal time = 18.3   The scope was withdrawn and the procedure completed.  COMPLICATIONS: There were no immediate complications.  ENDOSCOPIC IMPRESSION: 1.   Two sessile polyps ranging from 2 to 71mm in size were found at the ileocecal valve and in the ascending colon; polypectomies were performed with cold forceps 2.   Two sessile polyps ranging from 5 to 27mm in size were found in the ascending colon; polypectomies were performed with a cold snare  3.   Three sessile polyps ranging from 3 to 52mm in size were found in the sigmoid colon and rectosigmoid colon; polypectomies were performed with a cold snare and with cold forceps  RECOMMENDATIONS: 1.  Await pathology results 2.  If the polyps removed today are proven to be adenomatous (pre-cancerous) polyps, you will need a colonoscopy in 3 years. Otherwise you should continue to follow colorectal cancer screening guidelines for "routine risk" patients with a colonoscopy in 10 years.  You will receive a letter within 1-2 weeks with the results of your biopsy as well as final recommendations.  Please call my office if you have not received a letter after 3 weeks.  eSigned:  Jerene Bears, MD 01/03/2015 11:44  AM   cc: Delman Cheadle MD and The Patient   PATIENT NAME:  Gail, Schmitt MR#: [id number]

## 2015-01-03 NOTE — Patient Instructions (Signed)
Discharge instructions given. Handout on polyps. Resume previous medications. YOU HAD AN ENDOSCOPIC PROCEDURE TODAY AT THE [facility]:   Refer to the procedure report that was given to you for any specific questions about what was found during the examination.  If the procedure report does not answer your questions, please call your gastroenterologist to clarify.  If you requested that your care partner not be given the details of your procedure findings, then the procedure report has been included in a sealed envelope for you to review at your convenience later.  YOU SHOULD EXPECT: Some feelings of bloating in the abdomen. Passage of more gas than usual.  Walking can help get rid of the air that was put into your GI tract during the procedure and reduce the bloating. If you had a lower endoscopy (such as a colonoscopy or flexible sigmoidoscopy) you may notice spotting of blood in your stool or on the toilet paper. If you underwent a bowel prep for your procedure, you may not have a normal bowel movement for a few days.  Please Note:  You might notice some irritation and congestion in your nose or some drainage.  This is from the oxygen used during your procedure.  There is no need for concern and it should clear up in a day or so.  SYMPTOMS TO REPORT IMMEDIATELY:   Following lower endoscopy (colonoscopy or flexible sigmoidoscopy):  Excessive amounts of blood in the stool  Significant tenderness or worsening of abdominal pains  Swelling of the abdomen that is new, acute  Fever of 100F or higher   For urgent or emergent issues, a gastroenterologist can be reached at any hour by calling [phone].   DIET: Your first meal following the procedure should be a small meal and then it is ok to progress to your normal diet. Heavy or fried foods are harder to digest and may make you feel nauseous or bloated.  Likewise, meals heavy in dairy and vegetables can increase bloating.  Drink  plenty of fluids but you should avoid alcoholic beverages for 24 hours.  ACTIVITY:  You should plan to take it easy for the rest of today and you should NOT DRIVE or use heavy machinery until tomorrow (because of the sedation medicines used during the test).    FOLLOW UP: Our staff will call the number listed on your records the next business day following your procedure to check on you and address any questions or concerns that you may have regarding the information given to you following your procedure. If we do not reach you, we will leave a message.  However, if you are feeling well and you are not experiencing any problems, there is no need to return our call.  We will assume that you have returned to your regular daily activities without incident.  If any biopsies were taken you will be contacted by phone or by letter within the next 1-3 weeks.  Please call us at [phone] if you have not heard about the biopsies in 3 weeks.    SIGNATURES/CONFIDENTIALITY: You and/or your care partner have signed paperwork which will be entered into your electronic medical record.  These signatures attest to the fact that that the information above on your After Visit Summary has been reviewed and is understood.  Full responsibility of the confidentiality of this discharge information lies with you and/or your care-partner. 

## 2015-01-03 NOTE — Progress Notes (Signed)
Report to PACU, RN, vss, BBS= Clear.  

## 2015-01-03 NOTE — Progress Notes (Signed)
Called to room to assist during endoscopic procedure.  Patient ID and intended procedure confirmed with present staff. Received instructions for my participation in the procedure from the performing physician.  

## 2015-01-04 ENCOUNTER — Telehealth: Payer: Self-pay

## 2015-01-04 NOTE — Telephone Encounter (Signed)
No answer, left voicemail

## 2015-01-06 ENCOUNTER — Encounter: Payer: Self-pay | Admitting: Internal Medicine

## 2015-01-25 ENCOUNTER — Other Ambulatory Visit: Payer: Self-pay | Admitting: *Deleted

## 2015-01-25 DIAGNOSIS — I10 Essential (primary) hypertension: Secondary | ICD-10-CM

## 2015-01-25 MED ORDER — LISINOPRIL-HYDROCHLOROTHIAZIDE 20-25 MG PO TABS: 1.0000 | ORAL_TABLET | Freq: Every morning | ORAL | Status: DC

## 2015-02-10 ENCOUNTER — Other Ambulatory Visit: Payer: Self-pay

## 2015-02-10 MED ORDER — ERGOCALCIFEROL 1.25 MG (50000 UT) PO CAPS: 50000.0000 [IU] | ORAL_CAPSULE | ORAL | Status: DC

## 2015-06-06 ENCOUNTER — Ambulatory Visit (INDEPENDENT_AMBULATORY_CARE_PROVIDER_SITE_OTHER): Payer: BLUE CROSS/BLUE SHIELD

## 2015-06-06 ENCOUNTER — Ambulatory Visit (INDEPENDENT_AMBULATORY_CARE_PROVIDER_SITE_OTHER): Payer: BLUE CROSS/BLUE SHIELD | Admitting: Family Medicine

## 2015-06-06 VITALS — BP 118/82 | HR 79 | Temp 100.8°F | Resp 18 | Wt 250.8 lb

## 2015-06-06 DIAGNOSIS — J029 Acute pharyngitis, unspecified: Secondary | ICD-10-CM

## 2015-06-06 DIAGNOSIS — R509 Fever, unspecified: Secondary | ICD-10-CM

## 2015-06-06 DIAGNOSIS — J209 Acute bronchitis, unspecified: Secondary | ICD-10-CM | POA: Diagnosis not present

## 2015-06-06 DIAGNOSIS — R059 Cough, unspecified: Secondary | ICD-10-CM

## 2015-06-06 DIAGNOSIS — R6889 Other general symptoms and signs: Secondary | ICD-10-CM | POA: Diagnosis not present

## 2015-06-06 DIAGNOSIS — J0110 Acute frontal sinusitis, unspecified: Secondary | ICD-10-CM

## 2015-06-06 DIAGNOSIS — R05 Cough: Secondary | ICD-10-CM

## 2015-06-06 LAB — POCT RAPID STREP A (OFFICE): Rapid Strep A Screen: NEGATIVE

## 2015-06-06 MED ORDER — BENZONATATE 200 MG PO CAPS: 200.0000 mg | ORAL_CAPSULE | Freq: Two times a day (BID) | ORAL | Status: DC | PRN

## 2015-06-06 MED ORDER — IBUPROFEN 200 MG PO TABS: 600.0000 mg | ORAL_TABLET | Freq: Once | ORAL | Status: DC

## 2015-06-06 MED ORDER — HYDROCODONE-HOMATROPINE 5-1.5 MG/5ML PO SYRP: 5.0000 mL | ORAL_SOLUTION | Freq: Every evening | ORAL | Status: DC | PRN

## 2015-06-06 MED ORDER — AMOXICILLIN 500 MG PO CAPS: 500.0000 mg | ORAL_CAPSULE | Freq: Three times a day (TID) | ORAL | Status: DC

## 2015-06-06 NOTE — Progress Notes (Signed)
Chief Complaint:  Chief Complaint  Patient presents with  . URI    started last Friday.     HPI: Gail Schmitt is a 55 y.o. female who reports to [facility] today complaining of cough on Friday m she feels like she is wheezing, she has a fever in office nad also some sibjective fers and chills, she was with grandosn, Shestates they are not sick. She lives with her sister and niece and she is sick. She ahs ahd the flu vaccines. She ahs had this for the last 4 days. Headache, She tries to cough her chest feels like it is burning. She has used some tylenol and took also theraflu. She has no appetitie and is drinking. She feels quesy. She has frontal sinus pressue and also  Light sensitivity.   Past Medical History  Diagnosis Date  . Hypertension   . Obesity   . Dermatomyositis ([place])     a. noted prior to diagnosis of Breast CA ("i'm allergic to cancer")  . Heart murmur   . Family history of adverse reaction to anesthesia     "all my daughters get real sick and throw up alot"  . Unstable angina ([place])     a. Cath ~ 10 + yrs ago, [facility], [place] - "normal but pressures were high."  . Mitral valve prolapse   . Pulmonary embolism ([place]) ~ 2003  . Pneumonia     "haven't had it in the last 4 yrs; before that I had it q yr for 4-5 years" (07/28/2014)  . Dermatomyositis ([place])   . Headache     "maybe monthly" (07/28/2014)  . Arthritis     "left hip; hands" (07/28/2014)  . Breast cancer, left breast ([place]) 1999    a. s/p left mastectomy  . Uterine cancer ([place]) 2007   Past Surgical History  Procedure Laterality Date  . Laparoscopic total hysterectomy    . Mastectomy Left 1999  . Left heart catheterization with coronary angiogram N/A 07/13/2011    Procedure: LEFT HEART CATHETERIZATION WITH CORONARY ANGIOGRAM;  Surgeon: Josue Hector, MD;  Location[facility] C[facility] LAB;  Service: Cardiovascular;  Laterality: N/A;  . Cardiac catheterization  07/13/2011  . Breast biopsy Left 1999  . Reconstruction  breast immediate / delayed w/ tissue expander Left 2005  . Inguinal hernia repair Left 1986  . Total abdominal hysterectomy  2007  . Tubal ligation  1990  . Left heart catheterization with coronary angiogram N/A 07/30/2014    Procedure: LEFT HEART CATHETERIZATION WITH CORONARY ANGIOGRAM;  Surgeon: [name], MD;  Location: [facility] CATH LAB;  Service: Cardiovascular;  Laterality: N/A;  . Cholecystectomy N/A 07/31/2014    Procedure: LAPAROSCOPIC CHOLECYSTECTOMY WITH INTRAOPERATIVE CHOLANGIOGRAM;  Surgeon: Erroll Luna, MD;  Location: [facility];  Service: General;  Laterality: N/A;   Social History   Social History  . Marital Status: Divorced    Spouse Name: N/A  . Number of Children: N/A  . Years of Education: N/A   Social History Main Topics  . Smoking status: Never Smoker   . Smokeless tobacco: Never Used  . Alcohol Use: 0.6 oz/week    1 Standard drinks or equivalent per week     Comment: 07/28/2014 "might have 1 drink a couple times/month"  . Drug Use: No  . Sexual Activity: Not Currently   Other Topics Concern  . None   Social History Narrative   Lives @ home in [place] with 1 of her 3  dtrs.  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  Relatively acitve @ work but doesn't exercise @ home.  Doesn't follow any particular diet.   Family History  Problem Relation Age of Onset  . Diabetes type II Mother     alive @ 38  . Colon polyps Mother   . Diabetes Sister   . Stroke Brother   . Colon cancer Neg Hx    Allergies  Allergen Reactions  . Percocet [Oxycodone-Acetaminophen] Itching and Rash   Prior to Admission medications   Medication Sig Start Date End Date Taking? Authorizing Provider  ergocalciferol (VITAMIN D2) 50000 UNITS capsule Take 1 capsule (50,000 Units total) by mouth once a week. 02/10/15  Yes Shawnee Knapp, MD  lisinopril-hydrochlorothiazide (PRINZIDE,ZESTORETIC) 20-25 MG per tablet Take 1 tablet by mouth every morning. 01/25/15  Yes Chelle Jeffery, PA-C  terbinafine  (LAMISIL) 250 MG tablet Take 250 mg by mouth daily.   Yes Historical Provider, MD  verapamil (CALAN) 120 MG tablet Take 1 tablet (120 mg total) by mouth 2 (two) times daily. 11/19/14  Yes Shawnee Knapp, MD  VITAMIN D, ERGOCALCIFEROL, PO Take by mouth.   Yes Historical Provider, MD  polyethylene glycol powder (GLYCOLAX/MIRALAX) powder Take 17 g by mouth daily. Patient not taking: Reported on 01/03/2015 08/20/14   Shawnee Knapp, MD     ROS: The patient denies night sweats, unintentional weight loss, chest pain, palpitations, wheezing, dyspnea on exertion, nausea, vomiting, abdominal pain, dysuria, hematuria, melena, numbness, weakness, or tingling.   All other systems have been reviewed and were otherwise negative with the exception of those mentioned in the HPI and as above.    PHYSICAL EXAM: Filed Vitals:   06/06/15 1454  BP: 118/82  Pulse: 79  Temp: 100.8 F (38.2 C)  Resp: 18   Body mass index is 41.09 kg/(m^2).   General: Alert, tired appearing  HEENT:  Normocephalic, atraumatic, oropharynx patent. EOMI, PERRLA Erythematous throat, no exudates, TM normal, + sinus tenderness, + erythematous/boggy nasal mucosa Cardiovascular:  Regular rate and rhythm, no rubs murmurs or gallops.   Respiratory: Clear to auscultation bilaterally.  No wheezes, rales, or rhonchi.  No cyanosis, no use of accessory musculature Abdominal: No organomegaly, abdomen is soft and non-tender, positive bowel sounds. No masses. Skin: No rashes. Neurologic: Facial musculature symmetric. Psychiatric: Patient acts appropriately throughout our interaction. Lymphatic: No cervical or submandibular lymphadenopathy Musculoskeletal: Gait intact. No edema, tenderness   LABS:    EKG/XRAY:   Primary read interpreted by Dr. Marin Comment at [facility]. Neg for any acute cardiomegaly   ASSESSMENT/PLAN: Encounter Diagnoses  Name Primary?  . Fever, unspecified   . Acute pharyngitis, unspecified pharyngitis type   . Cough   . Flu-like  symptoms   . Acute frontal sinusitis, recurrence not specified Yes  . Acute bronchitis, unspecified organism     Strep negative in house, cx pending Rx Amoxacillin, tessalon nad hycodan prn  FU prn   Gross sideeffects, risk and benefits, and alternatives of medications d/w patient. Patient is aware that all medications have potential sideeffects and we are unable to predict every sideeffect or drug-drug interaction that may occur.  Marcin Holte DO  06/17/2015 11:54 AM

## 2015-06-06 NOTE — Patient Instructions (Signed)
Because you received an x-ray today, you will receive an invoice from [facility]. Please contact [facility] at [phone] with questions or concerns regarding your invoice. Our billing staff will not be able to assist you with those questions. °

## 2015-06-08 LAB — CULTURE, GROUP A STREP: Organism ID, Bacteria: NORMAL

## 2015-07-09 ENCOUNTER — Ambulatory Visit (INDEPENDENT_AMBULATORY_CARE_PROVIDER_SITE_OTHER): Payer: BLUE CROSS/BLUE SHIELD | Admitting: Family Medicine

## 2015-07-09 VITALS — BP 118/78 | HR 73 | Temp 98.1°F | Resp 18 | Wt 253.4 lb

## 2015-07-09 DIAGNOSIS — J209 Acute bronchitis, unspecified: Secondary | ICD-10-CM

## 2015-07-09 DIAGNOSIS — K219 Gastro-esophageal reflux disease without esophagitis: Secondary | ICD-10-CM

## 2015-07-09 MED ORDER — HYDROCODONE-HOMATROPINE 5-1.5 MG/5ML PO SYRP: 5.0000 mL | ORAL_SOLUTION | Freq: Every evening | ORAL | Status: DC | PRN

## 2015-07-09 MED ORDER — OMEPRAZOLE 40 MG PO CPDR: 40.0000 mg | DELAYED_RELEASE_CAPSULE | Freq: Every day | ORAL | Status: DC

## 2015-07-09 MED ORDER — ALBUTEROL SULFATE 108 (90 BASE) MCG/ACT IN AEPB
2.0000 | INHALATION_SPRAY | RESPIRATORY_TRACT | Status: DC | PRN
Start: 2015-07-09 — End: 2016-01-19

## 2015-07-09 MED ORDER — MUCINEX DM MAXIMUM STRENGTH 60-1200 MG PO TB12: 1.0000 | ORAL_TABLET | Freq: Two times a day (BID) | ORAL | Status: DC

## 2015-07-09 MED ORDER — AZITHROMYCIN 250 MG PO TABS: ORAL_TABLET | ORAL | Status: DC

## 2015-07-09 MED ORDER — ALBUTEROL SULFATE (2.5 MG/3ML) 0.083% IN NEBU
2.5000 mg | INHALATION_SOLUTION | Freq: Once | RESPIRATORY_TRACT | Status: AC
  Administered 2015-07-09: 2.5 mg via RESPIRATORY_TRACT

## 2015-07-09 MED ORDER — FLUTICASONE PROPIONATE 50 MCG/ACT NA SUSP: 2.0000 | Freq: Every day | NASAL | Status: DC

## 2015-07-09 NOTE — Patient Instructions (Signed)

## 2015-07-09 NOTE — Progress Notes (Signed)
Subjective:  By signing my name below, I, Raven Small, attest that this documentation has been prepared under the direction and in the presence of Delman Cheadle, MD.  Electronically Signed: Thea Alken, ED Scribe. 07/09/2015. 11:00 AM.   Patient ID: Gail Schmitt, female    DOB: [birth year], 55 y.o.   MRN: [id number]  HPI Chief Complaint  Patient presents with  . Cough    Cough has continued to linger on.     HPI Comments: Gail Schmitt is a 55 y.o. female who presents to the [facility] complaining of a persistent cough. She was seen 1 month ago for sinus infection. Treated with amoxicillin. Pt states she has same cough from last visit 1 months ago. Symptoms were initially getting better but cough has persisted. Cough was dry 1 week ago but is now productive with white mucous with an occasional metallic taste. . She reports coughing more during the day with increased activity and at night.  She has associated nasal congestion that returned yesterday and sore throat. She reports trouble sleeping last night due to symptoms . After she ran out medication prescribed at last visit, she started using OTC medication. She denies feeling SOB or winded and trouble with bowels.    Past Medical History  Diagnosis Date  . Hypertension   . Obesity   . Dermatomyositis ([place])     a. noted prior to diagnosis of Breast CA ("i'm allergic to cancer")  . Heart murmur   . Family history of adverse reaction to anesthesia     "all my daughters get real sick and throw up alot"  . Unstable angina ([place])     a. Cath ~ 10 + yrs ago, [facility] [facility], [place] - "normal but pressures were high."  . Mitral valve prolapse   . Pulmonary embolism ([place]) ~ 2003  . Pneumonia     "haven't had it in the last 4 yrs; before that I had it q yr for 4-5 years" (07/28/2014)  . Dermatomyositis ([place])   . Headache     "maybe monthly" (07/28/2014)  . Arthritis     "left hip; hands" (07/28/2014)  . Breast cancer, left breast  ([place]) 1999    a. s/p left mastectomy  . Uterine cancer ([place]) 2007   Past Surgical History  Procedure Laterality Date  . Laparoscopic total hysterectomy    . Mastectomy Left 1999  . Left heart catheterization with coronary angiogram N/A 07/13/2011    Procedure: LEFT HEART CATHETERIZATION WITH CORONARY ANGIOGRAM;  Surgeon: Josue Hector, MD;  Location[facility] CATH LAB;  Service: Cardiovascular;  Laterality: N/A;  . Cardiac catheterization  07/13/2011  . Breast biopsy Left 1999  . Reconstruction breast immediate / delayed w/ tissue expander Left 2005  . Inguinal hernia repair Left 1986  . Total abdominal hysterectomy  2007  . Tubal ligation  1990  . Left heart catheterization with coronary angiogram N/A 07/30/2014    Procedure: LEFT HEART CATHETERIZATION WITH CORONARY ANGIOGRAM;  Surgeon: [name], MD;  Location: [facility] CATH LAB;  Service: Cardiovascular;  Laterality: N/A;  . Cholecystectomy N/A 07/31/2014    Procedure: LAPAROSCOPIC CHOLECYSTECTOMY WITH INTRAOPERATIVE CHOLANGIOGRAM;  Surgeon: Erroll Luna, MD;  Location: [facility];  Service: General;  Laterality: N/A;   Prior to Admission medications   Medication Sig Start Date End Date Taking? Authorizing Provider  ergocalciferol (VITAMIN D2) 50000 UNITS capsule Take 1 capsule (50,000 Units total) by mouth once a week. 02/10/15  Yes  Shawnee Knapp, MD  lisinopril-hydrochlorothiazide (PRINZIDE,ZESTORETIC) 20-25 MG per tablet Take 1 tablet by mouth every morning. 01/25/15  Yes Chelle Jeffery, PA-C  polyethylene glycol powder (GLYCOLAX/MIRALAX) powder Take 17 g by mouth daily. 08/20/14  Yes Shawnee Knapp, MD  terbinafine (LAMISIL) 250 MG tablet Take 250 mg by mouth daily.   Yes Historical Provider, MD  verapamil (CALAN) 120 MG tablet Take 1 tablet (120 mg total) by mouth 2 (two) times daily. 11/19/14  Yes Shawnee Knapp, MD  VITAMIN D, ERGOCALCIFEROL, PO Take by mouth.   Yes Historical Provider, MD  amoxicillin (AMOXIL) 500 MG capsule Take 1 capsule (500 mg  total) by mouth 3 (three) times daily. Patient not taking: Reported on 07/09/2015 06/06/15   Thao P Le, DO  benzonatate (TESSALON) 200 MG capsule Take 1 capsule (200 mg total) by mouth 2 (two) times daily as needed for cough. Patient not taking: Reported on 07/09/2015 06/06/15   Thao P Le, DO  HYDROcodone-homatropine (HYCODAN) 5-1.5 MG/5ML syrup Take 5 mLs by mouth at bedtime as needed. She has taken this before Patient not taking: Reported on 07/09/2015 06/06/15   Thao P Le, DO   Review of Systems  Constitutional: Positive for activity change and fatigue. Negative for fever, chills and appetite change.  HENT: Positive for congestion and sore throat.   Respiratory: Positive for cough. Negative for chest tightness, shortness of breath and wheezing.   Cardiovascular: Negative for chest pain.  Gastrointestinal: Negative for diarrhea and constipation.  Neurological: Negative for dizziness and light-headedness.  Hematological: Negative for adenopathy.  Psychiatric/Behavioral: Positive for sleep disturbance.   Objective:   Physical Exam  Constitutional: She is oriented to person, place, and time. She appears well-developed and well-nourished. No distress.  HENT:  Head: Normocephalic and atraumatic.  Right Ear: Hearing, tympanic membrane, external ear and ear canal normal.  Left Ear: Hearing, external ear and ear canal normal. Tympanic membrane is erythematous. A middle ear effusion is present.  Nose: Mucosal edema present.  Mouth/Throat: Oropharynx is clear and moist.  Eyes: Conjunctivae and EOM are normal.  Neck: Neck supple.  Cardiovascular: Normal rate, regular rhythm and normal heart sounds.   No murmur heard. Rhonchi upper airy way and decreased air movement.   Pulmonary/Chest: Effort normal. She has no wheezes. She has rhonchi. She has no rales.  Musculoskeletal: Normal range of motion.  Lymphadenopathy:    She has no cervical adenopathy.  Neurological: She is alert and oriented to person,  place, and time.  Skin: Skin is warm and dry.  Psychiatric: She has a normal mood and affect. Her behavior is normal.  Nursing note and vitals reviewed.  Filed Vitals:   07/09/15 0939  BP: 118/78  Pulse: 73  Temp: 98.1 F (36.7 C)  TempSrc: Oral  Resp: 18  Weight: 253 lb 6.4 oz (114.941 kg)  SpO2: 98%   Predicted peak flow 454  Office Spirometry Results: Peak Flow: 290 L/MIN  Assessment & Plan:   I don't see that she has been on PPI prior and has used an inhaler prior.   1. Acute bronchitis, unspecified organism   2. Gastroesophageal reflux disease, esophagitis presence not specified     Meds ordered this encounter  Medications  . albuterol (PROVENTIL) (2.5 MG/3ML) 0.083% nebulizer solution 2.5 mg    Sig:   . HYDROcodone-homatropine (HYCODAN) 5-1.5 MG/5ML syrup    Sig: Take 5 mLs by mouth at bedtime as needed.    Dispense:  180 mL  Refill:  0  . omeprazole (PRILOSEC) 40 MG capsule    Sig: Take 1 capsule (40 mg total) by mouth daily. 30 min before a meal    Dispense:  30 capsule    Refill:  3  . fluticasone (FLONASE) 50 MCG/ACT nasal spray    Sig: Place 2 sprays into both nostrils at bedtime.    Dispense:  16 g    Refill:  2  . Albuterol Sulfate (PROAIR RESPICLICK) 123XX123 (90 Base) MCG/ACT AEPB    Sig: Inhale 2 puffs into the lungs every 4 (four) hours as needed.    Dispense:  1 each    Refill:  2  . azithromycin (ZITHROMAX) 250 MG tablet    Sig: Take 2 tabs PO x 1 dose, then 1 tab PO QD x 4 days    Dispense:  6 tablet    Refill:  0  . Dextromethorphan-Guaifenesin (MUCINEX DM MAXIMUM STRENGTH) 60-1200 MG TB12    Sig: Take 1 tablet by mouth every 12 (twelve) hours.    Dispense:  14 each    Refill:  1    I personally performed the services described in this documentation, which was scribed in my presence. The recorded information has been reviewed and considered, and addended by me as needed.  Delman Cheadle, MD MPH

## 2015-09-20 ENCOUNTER — Other Ambulatory Visit: Payer: Self-pay | Admitting: *Deleted

## 2015-09-20 ENCOUNTER — Telehealth: Payer: Self-pay

## 2015-09-20 DIAGNOSIS — I10 Essential (primary) hypertension: Secondary | ICD-10-CM

## 2015-09-20 MED ORDER — LISINOPRIL-HYDROCHLOROTHIAZIDE 20-25 MG PO TABS: 1.0000 | ORAL_TABLET | Freq: Every morning | ORAL | Status: DC

## 2015-09-20 NOTE — Telephone Encounter (Signed)
Left VM for pt to call back asap

## 2015-09-20 NOTE — Telephone Encounter (Signed)
Pt is requesting a refill on lisinopril-hydrochlorothiazide (PRINZIDE,ZESTORETIC) 20-25 MG per tablet . Requesting that refill be sent to the  [facility] on [address] [place], [place]

## 2015-09-20 NOTE — Telephone Encounter (Signed)
Spoke with patient prescription was called in for lisinopril.  Patient informed no more refills without office visit.  Patient understood

## 2015-12-11 ENCOUNTER — Other Ambulatory Visit: Payer: Self-pay | Admitting: Family Medicine

## 2016-01-19 ENCOUNTER — Encounter: Payer: Self-pay | Admitting: Family Medicine

## 2016-01-19 ENCOUNTER — Ambulatory Visit (INDEPENDENT_AMBULATORY_CARE_PROVIDER_SITE_OTHER): Payer: BLUE CROSS/BLUE SHIELD | Admitting: Family Medicine

## 2016-01-19 VITALS — BP 124/70 | HR 84 | Temp 98.3°F | Resp 18 | Ht 65.5 in | Wt 250.0 lb

## 2016-01-19 DIAGNOSIS — Z23 Encounter for immunization: Secondary | ICD-10-CM | POA: Diagnosis not present

## 2016-01-19 DIAGNOSIS — Z1231 Encounter for screening mammogram for malignant neoplasm of breast: Secondary | ICD-10-CM

## 2016-01-19 DIAGNOSIS — D62 Acute posthemorrhagic anemia: Secondary | ICD-10-CM | POA: Diagnosis not present

## 2016-01-19 DIAGNOSIS — I10 Essential (primary) hypertension: Secondary | ICD-10-CM | POA: Diagnosis not present

## 2016-01-19 DIAGNOSIS — Z1383 Encounter for screening for respiratory disorder NEC: Secondary | ICD-10-CM | POA: Diagnosis not present

## 2016-01-19 DIAGNOSIS — Z1389 Encounter for screening for other disorder: Secondary | ICD-10-CM

## 2016-01-19 DIAGNOSIS — Z136 Encounter for screening for cardiovascular disorders: Secondary | ICD-10-CM | POA: Diagnosis not present

## 2016-01-19 DIAGNOSIS — K760 Fatty (change of) liver, not elsewhere classified: Secondary | ICD-10-CM | POA: Diagnosis not present

## 2016-01-19 DIAGNOSIS — R7309 Other abnormal glucose: Secondary | ICD-10-CM

## 2016-01-19 DIAGNOSIS — E559 Vitamin D deficiency, unspecified: Secondary | ICD-10-CM | POA: Diagnosis not present

## 2016-01-19 DIAGNOSIS — E669 Obesity, unspecified: Secondary | ICD-10-CM

## 2016-01-19 DIAGNOSIS — Z124 Encounter for screening for malignant neoplasm of cervix: Secondary | ICD-10-CM | POA: Diagnosis not present

## 2016-01-19 DIAGNOSIS — Z0000 Encounter for general adult medical examination without abnormal findings: Secondary | ICD-10-CM

## 2016-01-19 DIAGNOSIS — Z130 Encounter for screening for diseases of the blood and blood-forming organs and certain disorders involving the immune mechanism: Secondary | ICD-10-CM | POA: Diagnosis not present

## 2016-01-19 DIAGNOSIS — Z113 Encounter for screening for infections with a predominantly sexual mode of transmission: Secondary | ICD-10-CM

## 2016-01-19 DIAGNOSIS — Z1329 Encounter for screening for other suspected endocrine disorder: Secondary | ICD-10-CM

## 2016-01-19 DIAGNOSIS — G5792 Unspecified mononeuropathy of left lower limb: Secondary | ICD-10-CM

## 2016-01-19 LAB — POCT URINALYSIS DIP (MANUAL ENTRY)
BILIRUBIN UA: NEGATIVE
Bilirubin, UA: NEGATIVE
Glucose, UA: NEGATIVE
LEUKOCYTES UA: NEGATIVE
Nitrite, UA: NEGATIVE
PH UA: 5
PROTEIN UA: NEGATIVE
RBC UA: NEGATIVE
Spec Grav, UA: 1.015
Urobilinogen, UA: 0.2

## 2016-01-19 LAB — COMPREHENSIVE METABOLIC PANEL
ALK PHOS: 60 U/L (ref 33–130)
ALT: 8 U/L (ref 6–29)
AST: 14 U/L (ref 10–35)
Albumin: 3.8 g/dL (ref 3.6–5.1)
BUN: 15 mg/dL (ref 7–25)
CALCIUM: 9.4 mg/dL (ref 8.6–10.4)
CO2: 29 mmol/L (ref 20–31)
Chloride: 102 mmol/L (ref 98–110)
Creat: 0.83 mg/dL (ref 0.50–1.05)
Glucose, Bld: 90 mg/dL (ref 65–99)
POTASSIUM: 3.9 mmol/L (ref 3.5–5.3)
Sodium: 140 mmol/L (ref 135–146)
TOTAL PROTEIN: 7.2 g/dL (ref 6.1–8.1)
Total Bilirubin: 0.5 mg/dL (ref 0.2–1.2)

## 2016-01-19 LAB — LIPID PANEL
Cholesterol: 159 mg/dL (ref 125–200)
HDL: 53 mg/dL (ref >=46)
LDL Cholesterol: 87 mg/dL (ref <130)
TRIGLYCERIDES: 97 mg/dL (ref <150)
Total CHOL/HDL Ratio: 3 Ratio (ref <=5.0)
VLDL: 19 mg/dL (ref <30)

## 2016-01-19 LAB — CBC
HCT: 38.4 % (ref 35.0–45.0)
Hemoglobin: 12.4 g/dL (ref 11.7–15.5)
MCH: 28.1 pg (ref 27.0–33.0)
MCHC: 32.3 g/dL (ref 32.0–36.0)
MCV: 87.1 fL (ref 80.0–100.0)
MPV: 10 fL (ref 7.5–12.5)
PLATELETS: 229 10*3/uL (ref 140–400)
RBC: 4.41 MIL/uL (ref 3.80–5.10)
RDW: 13.5 % (ref 11.0–15.0)
WBC: 3.5 10*3/uL — ABNORMAL LOW (ref 3.8–10.8)

## 2016-01-19 LAB — VITAMIN B12: VITAMIN B 12: 341 pg/mL (ref 200–1100)

## 2016-01-19 LAB — TSH: TSH: 1.56 mIU/L

## 2016-01-19 NOTE — Patient Instructions (Addendum)
You need to schedule you mammogram and your bone scan.  Please call the breast center at [facility] at [phone] to schedule     IF you received an x-ray today, you will receive an invoice from [facility]. Please contact [facility] at [phone] with questions or concerns regarding your invoice.   IF you received labwork today, you will receive an invoice from [employer]. Please contact [facility] at [phone] with questions or concerns regarding your invoice.   Our billing staff will not be able to assist you with questions regarding bills from these companies.  You will be contacted with the lab results as soon as they are available. The fastest way to get your results is to activate your My Chart account. Instructions are located on the last page of this paperwork. If you have not heard from [place] regarding the results in 2 weeks, please contact this office.

## 2016-01-19 NOTE — Progress Notes (Signed)
Subjective:    Patient ID: Gail Schmitt, female    DOB: [birth year], 55 y.o.   MRN: [id number] Chief Complaint  Patient presents with  . Annual Exam  . Flu Vaccine    HPI  Gail Schmitt is a 55 yo female here for a CPE.  Gail Schmitt cardiology with intermediate stress test so had cardiac cath 1.5 yrs prior HTN with murmur due to MVP.  H/o PE in 2003.  On lisinopril-hctz 20-25, verapamil 120 bid. Tol meds. Not checking out side office. H/o left breast cancer w/p mastectomy in 1999: had mammogram 01/2014 - abnml and rec [place] so 04/2014 had bilateral breast MRI which did not show concern for cancer and rec repeat mammogram 01/2015  Uterine cancer in 2007 with total hysterectomy. Had pelvic exam done 08/2014 pap n ml with neg HPV - consider doing every few yrs till 34 - no problems VIt D def - 12 - not on vitamins, not calcium supp or a lot in diet Fatty liver - has been cutting portions in half, walking 5 mi/d at work, walks with her grandkids.  When she has chest pain, it is treated with bengay, and feeling better with less CP now that more active.  colonscopy done last yr 12/2014 with adenmoatous poly so preeat in 2019 wwith Gail Schmitt - [facility]  TDaP 2015 EKG 07/2014  Feels like she has pinched nerve as the toe on her left side begins to feel funny - a little numb - when she is walking or on her feet for a while - just the big toe on left foot only - no injury or color or abraded.  Gets new shoes freq and changse shoes every few d w/o benefit. Toenail fungal resolved.  Occ has twinge in low back.  Rare EtOH  Depression screen Gail Schmitt 2/9 01/19/2016 07/09/2015 06/06/2015 10/15/2014 08/20/2014  Decreased Interest 0 0 0 0 0  Down, Depressed, Hopeless 0 0 0 0 0  PHQ - 2 Score 0 0 0 0 0   Past Medical History:  Diagnosis Date  . Arthritis    "left hip; hands" (07/28/2014)  . Breast cancer, left breast ([place]) 1999   a. s/p left mastectomy  . Dermatomyositis ([place])    a. noted prior to diagnosis  of Breast CA ("i'm allergic to cancer")  . Dermatomyositis ([place])   . Family history of adverse reaction to anesthesia    "all my daughters get real sick and throw up alot"  . Headache    "maybe monthly" (07/28/2014)  . Heart murmur   . Hypertension   . Mitral valve prolapse   . Obesity   . Pneumonia    "haven't had it in the last 4 yrs; before that I had it q yr for 4-5 years" (07/28/2014)  . Pulmonary embolism ([place]) ~ 2003  . Unstable angina ([place])    a. Cath ~ 10 + yrs ago, [facility], [place] - "normal but pressures were high."  . Uterine cancer ([place]) 2007   Past Surgical History:  Procedure Laterality Date  . BREAST BIOPSY Left 1999  . CARDIAC CATHETERIZATION  07/13/2011  . CHOLECYSTECTOMY N/A 07/31/2014   Procedure: LAPAROSCOPIC CHOLECYSTECTOMY WITH INTRAOPERATIVE CHOLANGIOGRAM;  Surgeon: Gail Luna, MD;  Location: [facility];  Service: General;  Laterality: N/A;  . INGUINAL HERNIA REPAIR Left 1986  . LAPAROSCOPIC TOTAL HYSTERECTOMY    . LEFT HEART CATHETERIZATION WITH CORONARY ANGIOGRAM N/A 07/13/2011   Procedure: LEFT HEART CATHETERIZATION WITH CORONARY ANGIOGRAM;  Surgeon: Gail Hector, MD;  Location: [facility] CATH LAB;  Service: Cardiovascular;  Laterality: N/A;  . LEFT HEART CATHETERIZATION WITH CORONARY ANGIOGRAM N/A 07/30/2014   Procedure: LEFT HEART CATHETERIZATION WITH CORONARY ANGIOGRAM;  Surgeon: Gail Booze, MD;  Location[facility] CATH LAB;  Service: Cardiovascular;  Laterality: N/A;  . MASTECTOMY Left 1999  . RECONSTRUCTION BREAST IMMEDIATE / DELAYED W/ TISSUE EXPANDER Left 2005  . TOTAL ABDOMINAL HYSTERECTOMY  2007  . TUBAL LIGATION  1990   Current Outpatient Prescriptions on File Prior to Visit  Medication Sig Dispense Refill  . lisinopril-hydrochlorothiazide (PRINZIDE,ZESTORETIC) 20-25 MG tablet Take 1 tablet by mouth every morning. 90 tablet 1  . VITAMIN D, ERGOCALCIFEROL, PO Take by mouth.    . [DISCONTINUED] lisinopril (PRINIVIL,ZESTRIL) 10 MG tablet Take 10 mg by  mouth daily.       Current Facility-Administered Medications on File Prior to Visit  Medication Dose Route Frequency Provider Last Rate Last Dose  . ibuprofen (ADVIL,MOTRIN) tablet 600 mg  600 mg Oral Once Gail P Le, DO       Allergies  Allergen Reactions  . Percocet [Oxycodone-Acetaminophen] Itching and Rash   Family History  Problem Relation Age of Onset  . Diabetes type II Mother     alive @ 35  . Colon polyps Mother   . Diabetes Sister   . Stroke Brother   . Colon cancer Neg Hx    Social History   Social History  . Marital status: Divorced    Spouse name: N/A  . Number of children: N/A  . Years of education: N/A   Social History Main Topics  . Smoking status: Never Smoker  . Smokeless tobacco: Never Used  . Alcohol use 0.6 oz/week    1 Standard drinks or equivalent per week     Comment: 07/28/2014 "might have 1 drink a couple times/month"  . Drug use: No  . Sexual activity: Not Currently   Other Topics Concern  . None   Social History Narrative   Lives @ home in [place] with 1 of her 3 dtrs.  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  Doesn't follow any particular diet.     Review of Systems  Constitutional: Positive for activity change and appetite change. Negative for chills, fever and unexpected weight change.  Respiratory: Negative for chest tightness.   Cardiovascular: Positive for chest pain and leg swelling. Negative for palpitations.  Musculoskeletal: Positive for arthralgias and back pain. Negative for gait problem and joint swelling.  Skin: Negative for color change, rash and wound.  Neurological: Positive for numbness.  All other systems reviewed and are negative.      Objective:   Physical Exam  Constitutional: She is oriented to person, place, and time. She appears well-developed and well-nourished. No distress.  HENT:  Head: Normocephalic and atraumatic.  Right Ear: Tympanic membrane,  external ear and ear canal normal.  Left Ear: Tympanic membrane, external ear and ear canal normal.  Nose: Nose normal. No mucosal edema or rhinorrhea.  Mouth/Throat: Uvula is midline, oropharynx is clear and moist and mucous membranes are normal. No posterior oropharyngeal erythema.  Eyes: Conjunctivae and EOM are normal. Pupils are equal, round, and reactive to light. Right eye exhibits no discharge. Left eye exhibits no discharge. No scleral icterus.  Neck: Normal range of motion. Neck supple. No thyromegaly present.  Cardiovascular: Normal rate, regular rhythm, normal heart sounds and intact distal pulses.  Pulmonary/Chest: Effort normal and breath sounds normal. No respiratory distress.  Abdominal: Soft. Bowel sounds are normal. There is no tenderness.  Musculoskeletal: She exhibits no edema.  Lymphadenopathy:    She has no cervical adenopathy.  Neurological: She is alert and oriented to person, place, and time. She has normal reflexes.  Skin: Skin is warm and dry. She is not diaphoretic. No erythema.  Psychiatric: She has a normal mood and affect. Her behavior is normal.    Less cold on left first toe base, monofil more senstivie causes tingles entore left leg 4++/5 No DTR l, 1+ P right tr ache R Flat feet, slightly inverted but normal gait and good shoes with arch Sharp felt more on left but could not feel soft well at all on either but wider area of not feeling on left.  BP 124/70 (BP Location: Right Arm, Patient Position: Sitting, Cuff Size: Large)   Pulse 84   Temp 98.3 F (36.8 C) (Oral)   Resp 18   Ht 5' 5.5" (1.664 m)   Wt 250 lb (113.4 kg)   SpO2 99%   BMI 40.97 kg/m      Assessment & Plan:  Lipid, cmp, ua, vit D , cbc anemia, tdsh Hep C, HIV Flu shot done today  Mammogram.  Refill bp meds - x 1 yr to pharmacy  1. Need for vaccination for H flu type B   2. Annual physical exam   3. Routine screening for STI (sexually transmitted infection)   4. Encounter  for screening mammogram for breast cancer   5. Screening for cardiovascular, respiratory, and genitourinary diseases   6. Screening for cervical cancer   7. Screening for deficiency anemia   8. Screening for thyroid disorder   9. Essential hypertension - well controlled, cont  10. Fatty infiltration of liver - lfts nml  11. Obesity   12. Acute blood loss anemia - resolved  13. Vitamin D deficiency - sig improved from 12-26 - restart otc supp qd.  14. Elevated glucose : a1c 5.7 - not checked prior so might have been worse, cont tlc  15. Neuropathy of foot, left - Very odd, consider neuro referral if persists. Consider changing footwear to type recommended at [employer] or [employer].    Lipids - HUGE improvement with recent tlc - unfortunately ASCVD risk actually worsened since last year from 3.5% to 3.9% as bp is a smidge higher and age but overall still good and no lipid med indicated.   Orders Placed This Encounter  Procedures  . Flu Vaccine QUAD 36+ mos IM (Fluarix)  . Lipid panel    Order Specific Question:   Has the patient fasted?    Answer:   Yes  . HIV antibody  . Comprehensive metabolic panel    Order Specific Question:   Has the patient fasted?    Answer:   Yes  . VITAMIN D 25 Hydroxy (Vit-D Deficiency, Fractures)  . CBC  . TSH  . Hepatitis C Antibody  . Hemoglobin A1c  . Vitamin B12  . Sedimentation Rate  . C-reactive protein  . POCT urinalysis dipstick     Delman Cheadle, M.D.  [facility] [address] [place], [place] [address] [phone] phone [phone] fax  02/01/16 1:28 PM

## 2016-01-20 LAB — HEPATITIS C ANTIBODY: HCV Ab: NEGATIVE

## 2016-01-20 LAB — C-REACTIVE PROTEIN: CRP: 5.7 mg/L (ref <8.0)

## 2016-01-20 LAB — HEMOGLOBIN A1C
Hgb A1c MFr Bld: 5.7 % — ABNORMAL HIGH (ref <5.7)
MEAN PLASMA GLUCOSE: 117 mg/dL

## 2016-01-20 LAB — VITAMIN D 25 HYDROXY (VIT D DEFICIENCY, FRACTURES): Vit D, 25-Hydroxy: 26 ng/mL — ABNORMAL LOW (ref 30–100)

## 2016-01-20 LAB — HIV ANTIBODY (ROUTINE TESTING W REFLEX): HIV: NONREACTIVE

## 2016-01-23 ENCOUNTER — Telehealth: Payer: Self-pay | Admitting: *Deleted

## 2016-01-23 LAB — SEDIMENTATION RATE

## 2016-01-23 NOTE — Telephone Encounter (Signed)
[facility] could not run sed rate.

## 2016-01-24 ENCOUNTER — Other Ambulatory Visit: Payer: Self-pay | Admitting: Family Medicine

## 2016-02-01 ENCOUNTER — Other Ambulatory Visit: Payer: Self-pay | Admitting: Family Medicine

## 2016-02-01 ENCOUNTER — Encounter: Payer: Self-pay | Admitting: Family Medicine

## 2016-02-01 MED ORDER — VERAPAMIL HCL 120 MG PO TABS: 120.0000 mg | ORAL_TABLET | Freq: Two times a day (BID) | ORAL | 3 refills | Status: DC

## 2016-02-01 MED ORDER — LISINOPRIL-HYDROCHLOROTHIAZIDE 20-25 MG PO TABS: 1.0000 | ORAL_TABLET | Freq: Every morning | ORAL | 3 refills | Status: DC

## 2016-02-21 ENCOUNTER — Ambulatory Visit: Payer: BLUE CROSS/BLUE SHIELD

## 2016-02-21 ENCOUNTER — Ambulatory Visit (INDEPENDENT_AMBULATORY_CARE_PROVIDER_SITE_OTHER): Payer: BLUE CROSS/BLUE SHIELD | Admitting: Family Medicine

## 2016-02-21 ENCOUNTER — Ambulatory Visit (INDEPENDENT_AMBULATORY_CARE_PROVIDER_SITE_OTHER): Payer: BLUE CROSS/BLUE SHIELD

## 2016-02-21 VITALS — BP 138/98 | HR 71 | Temp 98.2°F | Resp 17 | Ht 65.5 in | Wt 247.0 lb

## 2016-02-21 DIAGNOSIS — K2990 Gastroduodenitis, unspecified, without bleeding: Secondary | ICD-10-CM

## 2016-02-21 DIAGNOSIS — R1013 Epigastric pain: Secondary | ICD-10-CM

## 2016-02-21 DIAGNOSIS — K2970 Gastritis, unspecified, without bleeding: Secondary | ICD-10-CM | POA: Diagnosis not present

## 2016-02-21 DIAGNOSIS — K5900 Constipation, unspecified: Secondary | ICD-10-CM | POA: Diagnosis not present

## 2016-02-21 LAB — HEMOCCULT GUIAC POC 1CARD (OFFICE): Fecal Occult Blood, POC: NEGATIVE

## 2016-02-21 LAB — POCT URINALYSIS DIP (MANUAL ENTRY)
BILIRUBIN UA: NEGATIVE
BILIRUBIN UA: NEGATIVE
Glucose, UA: NEGATIVE
Leukocytes, UA: NEGATIVE
Nitrite, UA: NEGATIVE
PH UA: 8.5
Protein Ur, POC: 30 — AB
RBC UA: NEGATIVE
SPEC GRAV UA: 1.015
Urobilinogen, UA: 0.2

## 2016-02-21 LAB — POCT CBC
Granulocyte percent: 48.1 %G (ref 37–80)
HCT, POC: 40.1 % (ref 37.7–47.9)
Hemoglobin: 13.9 g/dL (ref 12.2–16.2)
Lymph, poc: 3.8 — AB (ref 0.6–3.4)
MCH, POC: 30.1 pg (ref 27–31.2)
MCHC: 34.7 g/dL (ref 31.8–35.4)
MCV: 87 fL (ref 80–97)
MID (CBC): 0.4 (ref 0–0.9)
MPV: 8.2 fL (ref 0–99.8)
PLATELET COUNT, POC: 146 10*3/uL (ref 142–424)
POC Granulocyte: 3.9 (ref 2–6.9)
POC LYMPH %: 46.7 % (ref 10–50)
POC MID %: 5.2 %M (ref 0–12)
RBC: 4.62 M/uL (ref 4.04–5.48)
RDW, POC: 13.9 %
WBC: 8.1 10*3/uL (ref 4.6–10.2)

## 2016-02-21 MED ORDER — OMEPRAZOLE 40 MG PO CPDR: 40.0000 mg | DELAYED_RELEASE_CAPSULE | Freq: Every day | ORAL | 3 refills | Status: DC

## 2016-02-21 MED ORDER — ONDANSETRON 4 MG PO TBDP
8.0000 mg | ORAL_TABLET | Freq: Once | ORAL | Status: AC
  Administered 2016-02-21: 8 mg via ORAL

## 2016-02-21 MED ORDER — GI COCKTAIL ~~LOC~~
30.0000 mL | Freq: Once | ORAL | Status: AC
  Administered 2016-02-21: 30 mL via ORAL

## 2016-02-21 NOTE — Patient Instructions (Addendum)
Drink a bottle of magnesium citrate or start taking senna 2 tabs twice a day.    IF you received an x-ray today, you will receive an invoice from [facility]. Please contact [facility] at [phone] with questions or concerns regarding your invoice.   IF you received labwork today, you will receive an invoice from [employer]. Please contact [facility] at [phone] with questions or concerns regarding your invoice.   Our billing staff will not be able to assist you with questions regarding bills from these companies.  You will be contacted with the lab results as soon as they are available. The fastest way to get your results is to activate your My Chart account. Instructions are located on the last page of this paperwork. If you have not heard from [place] regarding the results in 2 weeks, please contact this office.    We recommend that you schedule a mammogram for breast cancer screening. Typically, you do not need a referral to do this. Please contact a local imaging center to schedule your mammogram.  [facility] - [phone]  *ask for the Radiology [place] ([place]) - [phone] or [phone]  [facility] - [phone] [place] [phone] [facility] - [phone]  *ask for the [facility] - [phone]  *ask for the Radiology Department [facility] - [phone]  *ask for the [place] - [phone]  Clear Liquid Diet A clear liquid diet is a short-term diet that is prescribed to provide the necessary fluid and basic energy you need when you can have nothing else. The clear liquid diet consists of liquids or solids that will become liquid at room temperature. You should be able to see through the liquid. There are many reasons that you may be restricted to clear  liquids, such as:  When you have a sudden-onset (acute) condition that occurs before or after surgery.  To help your body slowly get adjusted to food again after a long period when you were unable to have food.  Replacement of fluids when you have a diarrheal disease.  When you are going to have certain exams, such as a colonoscopy, in which instruments are inserted inside your body to look at parts of your digestive system. WHAT CAN I HAVE? A clear liquid diet does not provide all the nutrients you need. It is important to choose a variety of the following items to get as many nutrients as possible:  Vegetable juices that do not have pulp.  Fruit juices and fruit drinks that do not have pulp.  Coffee (regular or decaffeinated), tea, or soda at the discretion of your health care provider.  Clear bouillon, broth, or strained broth-based soups.  High-protein and flavored gelatins.  Sugar or honey.  Ices or frozen ice pops that do not contain milk. If you are not sure whether you can have certain items, you should ask your health care provider. You may also ask your health care provider if there are any other clear liquid options.   This information is not intended to replace advice given to you by your health care provider. Make sure you discuss any questions you have with your health care provider.   Document Released: 04/23/2005 Document Revised: 04/28/2013 Document Reviewed: 03/20/2013 Elsevier Interactive Patient Education [employer].   Constipation, Adult Constipation is when a person has fewer than three bowel  movements a week, has difficulty having a bowel movement, or has stools that are dry, hard, or larger than normal. As people grow older, constipation is more common. A low-fiber diet, not taking in enough fluids, and taking certain medicines may make constipation worse.  CAUSES   Certain medicines, such as antidepressants, pain medicine, iron supplements,  antacids, and water pills.   Certain diseases, such as diabetes, irritable bowel syndrome (IBS), thyroid disease, or depression.   Not drinking enough water.   Not eating enough fiber-rich foods.   Stress or travel.   Lack of physical activity or exercise.   Ignoring the urge to have a bowel movement.   Using laxatives too much.  SIGNS AND SYMPTOMS   Having fewer than three bowel movements a week.   Straining to have a bowel movement.   Having stools that are hard, dry, or larger than normal.   Feeling full or bloated.   Pain in the lower abdomen.   Not feeling relief after having a bowel movement.  DIAGNOSIS  Your health care provider will take a medical history and perform a physical exam. Further testing may be done for severe constipation. Some tests may include:  A barium enema X-ray to examine your rectum, colon, and, sometimes, your small intestine.   A sigmoidoscopy to examine your lower colon.   A colonoscopy to examine your entire colon. TREATMENT  Treatment will depend on the severity of your constipation and what is causing it. Some dietary treatments include drinking more fluids and eating more fiber-rich foods. Lifestyle treatments may include regular exercise. If these diet and lifestyle recommendations do not help, your health care provider may recommend taking over-the-counter laxative medicines to help you have bowel movements. Prescription medicines may be prescribed if over-the-counter medicines do not work.  HOME CARE INSTRUCTIONS   Eat foods that have a lot of fiber, such as fruits, vegetables, whole grains, and beans.  Limit foods high in fat and processed sugars, such as french fries, hamburgers, cookies, candies, and soda.   A fiber supplement may be added to your diet if you cannot get enough fiber from foods.   Drink enough fluids to keep your urine clear or pale yellow.   Exercise regularly or as directed by your health  care provider.   Go to the restroom when you have the urge to go. Do not hold it.   Only take over-the-counter or prescription medicines as directed by your health care provider. Do not take other medicines for constipation without talking to your health care provider first.  [place] IF:   You have bright red blood in your stool.   Your constipation lasts for more than 4 days or gets worse.   You have abdominal or rectal pain.   You have thin, pencil-like stools.   You have unexplained weight loss. MAKE SURE YOU:   Understand these instructions.  Will watch your condition.  Will get help right away if you are not doing well or get worse.   This information is not intended to replace advice given to you by your health care provider. Make sure you discuss any questions you have with your health care provider.   Document Released: 01/20/2004 Document Revised: 05/14/2014 Document Reviewed: 02/02/2013 Elsevier Interactive Patient Education [employer].

## 2016-02-21 NOTE — Progress Notes (Signed)
Subjective:  By signing my name below, I, Moises Blood, attest that this documentation has been prepared under the direction and in the presence of Delman Cheadle, MD. Electronically Signed: Moises Blood, [place]. 02/21/2016 , 11:36 AM .  Patient was seen in Room 4 .   Patient ID: Gail Schmitt, female    DOB: May 26, 1960, 55 y.o.   MRN: [id number] Chief Complaint  Patient presents with  . Nausea    Onset this AM  . Abdominal Pain    onset this am   HPI Gail Schmitt is a 55 y.o. female who presents to [facility] complaining of abdominal pain and nausea that started early this morning. Patient states she woke up this morning at 3:47AM with terrible abdominal pain. She tried to have a bowel movement but not much passed. She laid back down, but her pain was persistent and intermittent. She states it finally calmed down around 6:00AM and was able to take a shower. While driving to work, her pain started to return and reports feeling "terrible pain until tears came down". She has had some burping and belching with this abdominal pain.   Patient informs having pasta last night with some burger meat. She doesn't eat burger often. She also had a small ice cream sandwich. She did not have any problems last night. She denies taking any new supplements. She denies taking ibuprofen or aleve recently. She did take a dose of pain medication over the last few mornings, but did not have any pain at that time. She denies eating any breakfast this morning or had anything to drink. When she tried to vomit this morning, she only had "slimey stuff" come up. She denies lightheadedness, headaches, or urinary symptoms.   Past Medical History:  Diagnosis Date  . Arthritis    "left hip; hands" (07/28/2014)  . Breast cancer, left breast ([place]) 1999   a. s/p left mastectomy  . Dermatomyositis ([place])    a. noted prior to diagnosis of Breast CA ("i'm allergic to cancer")  . Dermatomyositis ([place])   . Family history of  adverse reaction to anesthesia    "all my daughters get real sick and throw up alot"  . Headache    "maybe monthly" (07/28/2014)  . Heart murmur   . Hypertension   . Mitral valve prolapse   . Obesity   . Pneumonia    "haven't had it in the last 4 yrs; before that I had it q yr for 4-5 years" (07/28/2014)  . Pulmonary embolism ([place]) ~ 2003  . Unstable angina ([place])    a. Cath ~ 10 + yrs ago, [facility], [place] - "normal but pressures were high."  . Uterine cancer ([place]) 2007   Prior to Admission medications   Medication Sig Start Date End Date Taking? Authorizing Provider  lisinopril-hydrochlorothiazide (PRINZIDE,ZESTORETIC) 20-25 MG tablet Take 1 tablet by mouth every morning. 02/01/16  Yes Shawnee Knapp, MD  Methylcobalamin (B12-ACTIVE PO) Take by mouth daily.   Yes Historical Provider, MD  verapamil (CALAN) 120 MG tablet Take 1 tablet (120 mg total) by mouth 2 (two) times daily. 02/01/16  Yes Shawnee Knapp, MD  VITAMIN D, ERGOCALCIFEROL, PO Take by mouth.   Yes Historical Provider, MD   Allergies  Allergen Reactions  . Percocet [Oxycodone-Acetaminophen] Itching and Rash   Review of Systems  Constitutional: Negative for fatigue and unexpected weight change.  Respiratory: Negative for chest tightness and shortness of breath.   Cardiovascular: Negative for chest pain,  palpitations and leg swelling.  Gastrointestinal: Positive for abdominal pain and nausea. Negative for blood in stool.  Genitourinary: Negative for dysuria and frequency.  Neurological: Negative for dizziness, syncope, light-headedness and headaches.       Objective:   Physical Exam  Constitutional: She is oriented to person, place, and time. She appears well-developed and well-nourished. No distress.  HENT:  Head: Normocephalic and atraumatic.  Eyes: EOM are normal. Pupils are equal, round, and reactive to light.  Neck: Neck supple.  Cardiovascular: Normal rate, regular rhythm and normal heart sounds.   No murmur  heard. Pulmonary/Chest: Effort normal and breath sounds normal. No respiratory distress. She has no wheezes.  Abdominal: She exhibits distension. Bowel sounds are increased. There is generalized tenderness. There is CVA tenderness (slight left). There is no rebound and no guarding.  Generalized tenderness without guarding or rebound Worse over LUQ and epigastrium with guarding at epigastrium, no rebound  Musculoskeletal: Normal range of motion.  Neurological: She is alert and oriented to person, place, and time.  Skin: Skin is warm and dry.  Psychiatric: She has a normal mood and affect. Her behavior is normal.  Nursing note and vitals reviewed.   BP (!) 138/98 (BP Location: Right Arm, Patient Position: Sitting, Cuff Size: Large)   Pulse 71   Temp 98.2 F (36.8 C) (Oral)   Resp 17   Ht 5' 5.5" (1.664 m)   Wt 247 lb (112 kg)   SpO2 99%   BMI 40.48 kg/m    Results for orders placed or performed in visit on 02/21/16  POCT CBC  Result Value Ref Range   WBC 8.1 4.6 - 10.2 K/uL   Lymph, poc 3.8 (A) 0.6 - 3.4   POC LYMPH PERCENT 46.7 10 - 50 %L   MID (cbc) 0.4 0 - 0.9   POC MID % 5.2 0 - 12 %M   POC Granulocyte 3.9 2 - 6.9   Granulocyte percent 48.1 37 - 80 %G   RBC 4.62 4.04 - 5.48 M/uL   Hemoglobin 13.9 12.2 - 16.2 g/dL   HCT, POC 40.1 37.7 - 47.9 %   MCV 87.0 80 - 97 fL   MCH, POC 30.1 27 - 31.2 pg   MCHC 34.7 31.8 - 35.4 g/dL   RDW, POC 13.9 %   Platelet Count, POC 146 142 - 424 K/uL   MPV 8.2 0 - 99.8 fL  POCT occult blood stool  Result Value Ref Range   Fecal Occult Blood, POC Negative Negative   Card #1 Date 02/21/2016    Card #2 Fecal Occult Blod, POC     Card #2 Date     Card #3 Fecal Occult Blood, POC     Card #3 Date    POCT urinalysis dipstick  Result Value Ref Range   Color, UA yellow yellow   Clarity, UA clear clear   Glucose, UA negative negative   Bilirubin, UA negative negative   Ketones, POC UA negative negative   Spec Grav, UA 1.015    Blood, UA  negative negative   pH, UA 8.5    Protein Ur, POC =30 (A) negative   Urobilinogen, UA 0.2    Nitrite, UA Negative Negative   Leukocytes, UA Negative Negative   Dg Abd Acute W/chest  Result Date: 02/21/2016 CLINICAL DATA:  Abdominal pain and nausea EXAM: DG ABDOMEN ACUTE W/ 1V CHEST COMPARISON:  06/06/2015 FINDINGS: Cardiac shadow is within normal limits. The lungs are clear bilaterally. Left breast  implant is noted. Scattered large and small bowel gas is noted. Fecal material is noted throughout the colon which may be related to a mild degree of constipation. No free air is seen. Postsurgical changes are noted. No acute bony abnormality is seen. IMPRESSION: Question mild constipation. No other focal abnormality is noted. Electronically Signed   By: Inez Catalina M.D.   On: 02/21/2016 12:01       Assessment & Plan:   1. Abdominal pain, epigastric   2. Gastritis and gastroduodenitis   3. Constipation, unspecified constipation type   Start ppi Try mag citrate or senna Stay on clear liquids x 24 hrs and recheck with me in 1-2d if improving.  If worse at all -> to ER  Orders Placed This Encounter  Procedures  . DG Abd Acute W/Chest    Standing Status:   Future    Number of Occurrences:   1    Standing Expiration Date:   02/20/2017    Order Specific Question:   Reason for exam:    Answer:   acute epigastric pain starting this morning, r/o partial SBO    Order Specific Question:   Is the patient pregnant?    Answer:   No    Order Specific Question:   Preferred imaging location?    Answer:   External  . Comprehensive metabolic panel  . Lipase  . POCT CBC  . POCT occult blood stool  . POCT urinalysis dipstick    Meds ordered this encounter  Medications  . Methylcobalamin (B12-ACTIVE PO)    Sig: Take by mouth daily.  . ondansetron (ZOFRAN-ODT) disintegrating tablet 8 mg  . gi cocktail (Maalox,Lidocaine,Donnatal)  . omeprazole (PRILOSEC) 40 MG capsule    Sig: Take 1 capsule (40  mg total) by mouth daily.    Dispense:  30 capsule    Refill:  3    I personally performed the services described in this documentation, which was scribed in my presence. The recorded information has been reviewed and considered, and addended by me as needed.   Delman Cheadle, M.D.  [facility] [address] [place], [place] [address] [phone] phone [phone] fax  03/05/16 11:13 PM

## 2016-02-22 LAB — LIPASE: Lipase: 5 U/L — ABNORMAL LOW (ref 7–60)

## 2016-02-22 LAB — COMPREHENSIVE METABOLIC PANEL
ALBUMIN: 3.7 g/dL (ref 3.6–5.1)
ALK PHOS: 68 U/L (ref 33–130)
ALT: 8 U/L (ref 6–29)
AST: 15 U/L (ref 10–35)
BILIRUBIN TOTAL: 0.5 mg/dL (ref 0.2–1.2)
BUN: 16 mg/dL (ref 7–25)
CALCIUM: 9 mg/dL (ref 8.6–10.4)
CO2: 24 mmol/L (ref 20–31)
CREATININE: 0.71 mg/dL (ref 0.50–1.05)
Chloride: 100 mmol/L (ref 98–110)
GLUCOSE: 114 mg/dL — AB (ref 65–99)
Potassium: 4 mmol/L (ref 3.5–5.3)
SODIUM: 137 mmol/L (ref 135–146)
Total Protein: 6.9 g/dL (ref 6.1–8.1)

## 2016-02-23 ENCOUNTER — Other Ambulatory Visit: Payer: Self-pay | Admitting: Family Medicine

## 2016-02-23 DIAGNOSIS — Z1231 Encounter for screening mammogram for malignant neoplasm of breast: Secondary | ICD-10-CM

## 2016-02-24 ENCOUNTER — Telehealth: Payer: Self-pay

## 2016-02-24 NOTE — Telephone Encounter (Signed)
Pt is needing to talk with someone to modify her work note from full days to half days   Best number [phone]

## 2016-02-28 NOTE — Telephone Encounter (Signed)
That's fine

## 2016-03-02 NOTE — Telephone Encounter (Signed)
Patient states she already has it straight with work, she no longer needs our assistance.

## 2016-03-26 ENCOUNTER — Ambulatory Visit
Admission: RE | Admit: 2016-03-26 | Discharge: 2016-03-26 | Disposition: A | Discharge status: Home / Self Care | Payer: BLUE CROSS/BLUE SHIELD | Source: Ambulatory Visit | Attending: Family Medicine | Admitting: Family Medicine

## 2016-03-26 DIAGNOSIS — Z1231 Encounter for screening mammogram for malignant neoplasm of breast: Secondary | ICD-10-CM

## 2016-04-09 ENCOUNTER — Ambulatory Visit (INDEPENDENT_AMBULATORY_CARE_PROVIDER_SITE_OTHER): Payer: BLUE CROSS/BLUE SHIELD | Admitting: Family Medicine

## 2016-04-09 VITALS — BP 140/92 | HR 91 | Temp 98.3°F | Resp 17 | Ht 65.5 in | Wt 251.0 lb

## 2016-04-09 DIAGNOSIS — N3001 Acute cystitis with hematuria: Secondary | ICD-10-CM | POA: Diagnosis not present

## 2016-04-09 DIAGNOSIS — M545 Low back pain: Secondary | ICD-10-CM | POA: Diagnosis not present

## 2016-04-09 DIAGNOSIS — R300 Dysuria: Secondary | ICD-10-CM | POA: Diagnosis not present

## 2016-04-09 LAB — POCT URINALYSIS DIP (MANUAL ENTRY)
BILIRUBIN UA: NEGATIVE
Glucose, UA: NEGATIVE
Ketones, POC UA: NEGATIVE
Leukocytes, UA: NEGATIVE
NITRITE UA: POSITIVE — AB
PH UA: 5
Protein Ur, POC: NEGATIVE
Spec Grav, UA: <=1.005
Urobilinogen, UA: 0.2

## 2016-04-09 LAB — POC MICROSCOPIC URINALYSIS (UMFC): MUCUS RE: ABSENT

## 2016-04-09 MED ORDER — NITROFURANTOIN MONOHYD MACRO 100 MG PO CAPS: 100.0000 mg | ORAL_CAPSULE | Freq: Two times a day (BID) | ORAL | 0 refills | Status: DC

## 2016-04-09 NOTE — Progress Notes (Signed)
Subjective:  By signing my name below, I, Moises Blood, attest that this documentation has been prepared under the direction and in the presence of Merri Ray, MD. Electronically Signed: Moises Blood, [place]. 04/09/2016 , 5:57 PM .  Patient was seen in Room 12 .   Patient ID: Gail Schmitt, female    DOB: 01-Nov-1960, 55 y.o.   MRN: [id number] Chief Complaint  Patient presents with  . Dysuria    And left sided flank pain. Since Saturday   HPI Gail Schmitt is a 55 y.o. female Here with dysuria and left sided flank pain. She as seen on Oct 17th for abdominal pain, suspected constipation and gastritis. Overall, reassuring urinalysis at that time without sign of infection.   Patient states she started having tingling urination about 2 days ago. She immediately tried drinking water, cranberry, and Azo without relief. She notes having LLQ abdominal pain with some chills today. She hasn't had a measured fever. She reports feeling some pain in her legs as well. She denies any new stool or urinary incontinence, or saddle anesthesia.   Her last colonoscopy was in Aug 2016, with no mention of diverticulosis. Her last bowel movement was 2 hours ago at work, which was soft and normal. She does also mention doing more twisting and lifting at work, due to holiday season.   Patient Active Problem List   Diagnosis Date Noted  . CN (constipation) 10/22/2014  . Abnormal nuclear stress test   . Biliary colic [id number]  . Symptomatic cholelithiasis 07/06/2014  . Vitamin D deficiency 06/23/2014  . Fatty infiltration of liver 06/23/2014  . Unstable angina pectoris ([place]) 07/13/2011  . Chest pain, midsternal   . Obesity   . Chest pain 07/12/2011  . HTN (hypertension) 07/12/2011   Past Medical History:  Diagnosis Date  . Arthritis    "left hip; hands" (07/28/2014)  . Breast cancer, left breast ([place]) 1999   a. s/p left mastectomy  . Dermatomyositis ([place])    a. noted prior to diagnosis of  Breast CA ("i'm allergic to cancer")  . Dermatomyositis ([place])   . Family history of adverse reaction to anesthesia    "all my daughters get real sick and throw up alot"  . Headache    "maybe monthly" (07/28/2014)  . Heart murmur   . Hypertension   . Mitral valve prolapse   . Obesity   . Pneumonia    "haven't had it in the last 4 yrs; before that I had it q yr for 4-5 years" (07/28/2014)  . Pulmonary embolism ([place]) ~ 2003  . Unstable angina ([place])    a. Cath ~ 10 + yrs ago, [facility], [place] - "normal but pressures were high."  . Uterine cancer ([place]) 2007   Past Surgical History:  Procedure Laterality Date  . BREAST BIOPSY Left 1999  . CARDIAC CATHETERIZATION  07/13/2011  . CHOLECYSTECTOMY N/A 07/31/2014   Procedure: LAPAROSCOPIC CHOLECYSTECTOMY WITH INTRAOPERATIVE CHOLANGIOGRAM;  Surgeon: Erroll Luna, MD;  Location: [facility];  Service: General;  Laterality: N/A;  . INGUINAL HERNIA REPAIR Left 1986  . LAPAROSCOPIC TOTAL HYSTERECTOMY    . LEFT HEART CATHETERIZATION WITH CORONARY ANGIOGRAM N/A 07/13/2011   Procedure: LEFT HEART CATHETERIZATION WITH CORONARY ANGIOGRAM;  Surgeon: Josue Hector, MD;  Location: [facility] CATH LAB;  Service: Cardiovascular;  Laterality: N/A;  . LEFT HEART CATHETERIZATION WITH CORONARY ANGIOGRAM N/A 07/30/2014   Procedure: LEFT HEART CATHETERIZATION WITH CORONARY ANGIOGRAM;  Surgeon: [name], MD;  Location: [facility]  CATH LAB;  Service: Cardiovascular;  Laterality: N/A;  . MASTECTOMY Left 1999  . RECONSTRUCTION BREAST IMMEDIATE / DELAYED W/ TISSUE EXPANDER Left 2005  . TOTAL ABDOMINAL HYSTERECTOMY  2007  . TUBAL LIGATION  1990   Allergies  Allergen Reactions  . Percocet [Oxycodone-Acetaminophen] Itching and Rash   Prior to Admission medications   Medication Sig Start Date End Date Taking? Authorizing Provider  lisinopril-hydrochlorothiazide (PRINZIDE,ZESTORETIC) 20-25 MG tablet Take 1 tablet by mouth every morning. 02/01/16  Yes Shawnee Knapp, MD  Methylcobalamin  (B12-ACTIVE PO) Take by mouth daily.   Yes Historical Provider, MD  omeprazole (PRILOSEC) 40 MG capsule Take 1 capsule (40 mg total) by mouth daily. 02/21/16  Yes Shawnee Knapp, MD  verapamil (CALAN) 120 MG tablet Take 1 tablet (120 mg total) by mouth 2 (two) times daily. 02/01/16  Yes Shawnee Knapp, MD  VITAMIN D, ERGOCALCIFEROL, PO Take by mouth.   Yes Historical Provider, MD   Social History   Social History  . Marital status: Divorced    Spouse name: N/A  . Number of children: N/A  . Years of education: N/A   Occupational History  . Not on file.   Social History Main Topics  . Smoking status: Never Smoker  . Smokeless tobacco: Never Used  . Alcohol use 0.6 oz/week    1 Standard drinks or equivalent per week     Comment: 07/28/2014 "might have 1 drink a couple times/month"  . Drug use: No  . Sexual activity: Not Currently   Other Topics Concern  . Not on file   Social History Narrative   Lives @ home in [place] with 1 of her 3 dtrs.  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  Doesn't follow any particular diet.   Review of Systems  Constitutional: Positive for chills. Negative for fatigue, fever and unexpected weight change.  Respiratory: Negative for cough.   Gastrointestinal: Positive for abdominal pain. Negative for blood in stool, constipation, diarrhea, nausea and vomiting.  Genitourinary: Positive for dysuria and flank pain. Negative for frequency.  Musculoskeletal: Positive for myalgias.  Skin: Negative for rash and wound.  Neurological: Negative for dizziness, weakness and headaches.       Objective:   Physical Exam  Constitutional: She is oriented to person, place, and time. She appears well-developed and well-nourished. No distress.  HENT:  Head: Normocephalic and atraumatic.  Eyes: EOM are normal. Pupils are equal, round, and reactive to light.  Neck: Neck supple.  Cardiovascular: Normal rate.   Pulmonary/Chest:  Effort normal. No respiratory distress.  Abdominal: There is no tenderness. There is no CVA tenderness.  No true CVA tenderness, no focal tenderness of abdomen  Musculoskeletal: Normal range of motion.  Negative straight leg raise  Neurological: She is alert and oriented to person, place, and time.  Skin: Skin is warm and dry.  Psychiatric: She has a normal mood and affect. Her behavior is normal.  Nursing note and vitals reviewed.   Vitals:   04/09/16 1630  BP: (!) 140/92  Pulse: 91  Resp: 17  Temp: 98.3 F (36.8 C)  TempSrc: Oral  SpO2: 98%  Weight: 251 lb (113.9 kg)  Height: 5' 5.5" (1.664 m)   Results for orders placed or performed in visit on 04/09/16  POCT Microscopic Urinalysis ([facility])  Result Value Ref Range   WBC,UR,HPF,POC None None WBC/hpf   RBC,UR,HPF,POC Few (A) None RBC/hpf   Bacteria None  None, Too numerous to count   Mucus Absent Absent   Epithelial Cells, UR Per Microscopy Few (A) None, Too numerous to count cells/hpf  POCT urinalysis dipstick  Result Value Ref Range   Color, UA yellow yellow   Clarity, UA clear clear   Glucose, UA negative negative   Bilirubin, UA negative negative   Ketones, POC UA negative negative   Spec Grav, UA <=1.005    Blood, UA large (A) negative   pH, UA 5.0    Protein Ur, POC negative negative   Urobilinogen, UA 0.2    Nitrite, UA Positive (A) Negative   Leukocytes, UA Negative Negative       Assessment & Plan:    Gail Schmitt is a 55 y.o. female Burning with urination - Plan: POCT Microscopic Urinalysis ([facility]), POCT urinalysis dipstick Acute cystitis with hematuria - Plan: Urine culture  - Start Macrobid, check urine culture, continue Azo over-the-counter if needed, fluids, RTC precautions.  Acute left-sided low back pain, with sciatica presence unspecified  -Likely MSK cause with change in activity. No red flags on exam or history. Symptomatic care, RTC precautions discussed.  Meds ordered this encounter    Medications  . nitrofurantoin, macrocrystal-monohydrate, (MACROBID) 100 MG capsule    Sig: Take 1 capsule (100 mg total) by mouth 2 (two) times daily.    Dispense:  14 capsule    Refill:  0   Patient Instructions    The urine test today does appear to show some signs of infection. Start the antibiotic Macrobid 1 pill twice per day for 1 week, okay to continue Azo over-the-counter for the next day or two, and drink plenty of fluids. If you are not improving in the next 3-4 days, or any worsening sooner including nausea, vomiting, fever, or other worsening symptoms, return for recheck here or other medical provider.  Tylenol if needed for low back pain as that may be due to muscle pain with change in activities recently. If you have any worsening of that pain, or continues to radiate to your leg, return for recheck.  Return to the clinic or go to the nearest emergency room if any of your symptoms worsen or new symptoms occur.   Urinary Tract Infection, Adult A urinary tract infection (UTI) is an infection of any part of the urinary tract, which includes the kidneys, ureters, bladder, and urethra. These organs make, store, and get rid of urine in the body. UTI can be a bladder infection (cystitis) or kidney infection (pyelonephritis). What are the causes? This infection may be caused by fungi, viruses, or bacteria. Bacteria are the most common cause of UTIs. This condition can also be caused by repeated incomplete emptying of the bladder during urination. What increases the risk? This condition is more likely to develop if:  You ignore your need to urinate or hold urine for long periods of time.  You do not empty your bladder completely during urination.  You wipe back to front after urinating or having a bowel movement, if you are female.  You are uncircumcised, if you are female.  You are constipated.  You have a urinary catheter that stays in place (indwelling).  You have a weak  defense (immune) system.  You have a medical condition that affects your bowels, kidneys, or bladder.  You have diabetes.  You take antibiotic medicines frequently or for long periods of time, and the antibiotics no longer work well against certain types of infections (antibiotic resistance).  You  take medicines that irritate your urinary tract.  You are exposed to chemicals that irritate your urinary tract.  You are female. What are the signs or symptoms? Symptoms of this condition include:  Fever.  Frequent urination or passing small amounts of urine frequently.  Needing to urinate urgently.  Pain or burning with urination.  Urine that smells bad or unusual.  Cloudy urine.  Pain in the lower abdomen or back.  Trouble urinating.  Blood in the urine.  Vomiting or being less hungry than normal.  Diarrhea or abdominal pain.  Vaginal discharge, if you are female. How is this diagnosed? This condition is diagnosed with a medical history and physical exam. You will also need to provide a urine sample to test your urine. Other tests may be done, including:  Blood tests.  Sexually transmitted disease (STD) testing. If you have had more than one UTI, a cystoscopy or imaging studies may be done to determine the cause of the infections. How is this treated? Treatment for this condition often includes a combination of two or more of the following:  Antibiotic medicine.  Other medicines to treat less common causes of UTI.  Over-the-counter medicines to treat pain.  Drinking enough water to stay hydrated. Follow these instructions at home:  Take over-the-counter and prescription medicines only as told by your health care provider.  If you were prescribed an antibiotic, take it as told by your health care provider. Do not stop taking the antibiotic even if you start to feel better.  Avoid alcohol, caffeine, tea, and carbonated beverages. They can irritate your  bladder.  Drink enough fluid to keep your urine clear or pale yellow.  Keep all follow-up visits as told by your health care provider. This is important.  Make sure to:  Empty your bladder often and completely. Do not hold urine for long periods of time.  Empty your bladder before and after sex.  Wipe from front to back after a bowel movement if you are female. Use each tissue one time when you wipe. Contact a health care provider if:  You have back pain.  You have a fever.  You feel nauseous or vomit.  Your symptoms do not get better after 3 days.  Your symptoms go away and then return. Get help right away if:  You have severe back pain or lower abdominal pain.  You are vomiting and cannot keep down any medicines or water. This information is not intended to replace advice given to you by your health care provider. Make sure you discuss any questions you have with your health care provider. Document Released: 01/31/2005 Document Revised: 10/05/2015 Document Reviewed: 03/14/2015 Elsevier Interactive Patient Education  2017 [employer].   IF you received an x-ray today, you will receive an invoice from [facility]. Please contact [facility] at [phone] with questions or concerns regarding your invoice.   IF you received labwork today, you will receive an invoice from [employer]. Please contact [facility] at [phone] with questions or concerns regarding your invoice.   Our billing staff will not be able to assist you with questions regarding bills from these companies.  You will be contacted with the lab results as soon as they are available. The fastest way to get your results is to activate your My Chart account. Instructions are located on the last page of this paperwork. If you have not heard from [place] regarding the results in 2 weeks, please contact this office.  I personally performed the services described  in this documentation, which was scribed in my presence. The recorded information has been reviewed and considered, and addended by me as needed.   Signed,   Merri Ray, MD [facility].  04/10/16 11:02 PM

## 2016-04-09 NOTE — Patient Instructions (Addendum)
The urine test today does appear to show some signs of infection. Start the antibiotic Macrobid 1 pill twice per day for 1 week, okay to continue Azo over-the-counter for the next day or two, and drink plenty of fluids. If you are not improving in the next 3-4 days, or any worsening sooner including nausea, vomiting, fever, or other worsening symptoms, return for recheck here or other medical provider.  Tylenol if needed for low back pain as that may be due to muscle pain with change in activities recently. If you have any worsening of that pain, or continues to radiate to your leg, return for recheck.  Return to the clinic or go to the nearest emergency room if any of your symptoms worsen or new symptoms occur.   Urinary Tract Infection, Adult A urinary tract infection (UTI) is an infection of any part of the urinary tract, which includes the kidneys, ureters, bladder, and urethra. These organs make, store, and get rid of urine in the body. UTI can be a bladder infection (cystitis) or kidney infection (pyelonephritis). What are the causes? This infection may be caused by fungi, viruses, or bacteria. Bacteria are the most common cause of UTIs. This condition can also be caused by repeated incomplete emptying of the bladder during urination. What increases the risk? This condition is more likely to develop if:  You ignore your need to urinate or hold urine for long periods of time.  You do not empty your bladder completely during urination.  You wipe back to front after urinating or having a bowel movement, if you are female.  You are uncircumcised, if you are female.  You are constipated.  You have a urinary catheter that stays in place (indwelling).  You have a weak defense (immune) system.  You have a medical condition that affects your bowels, kidneys, or bladder.  You have diabetes.  You take antibiotic medicines frequently or for long periods of time, and the antibiotics no  longer work well against certain types of infections (antibiotic resistance).  You take medicines that irritate your urinary tract.  You are exposed to chemicals that irritate your urinary tract.  You are female. What are the signs or symptoms? Symptoms of this condition include:  Fever.  Frequent urination or passing small amounts of urine frequently.  Needing to urinate urgently.  Pain or burning with urination.  Urine that smells bad or unusual.  Cloudy urine.  Pain in the lower abdomen or back.  Trouble urinating.  Blood in the urine.  Vomiting or being less hungry than normal.  Diarrhea or abdominal pain.  Vaginal discharge, if you are female. How is this diagnosed? This condition is diagnosed with a medical history and physical exam. You will also need to provide a urine sample to test your urine. Other tests may be done, including:  Blood tests.  Sexually transmitted disease (STD) testing. If you have had more than one UTI, a cystoscopy or imaging studies may be done to determine the cause of the infections. How is this treated? Treatment for this condition often includes a combination of two or more of the following:  Antibiotic medicine.  Other medicines to treat less common causes of UTI.  Over-the-counter medicines to treat pain.  Drinking enough water to stay hydrated. Follow these instructions at home:  Take over-the-counter and prescription medicines only as told by your health care provider.  If you were prescribed an antibiotic, take it as told by your health care provider. Do  not stop taking the antibiotic even if you start to feel better.  Avoid alcohol, caffeine, tea, and carbonated beverages. They can irritate your bladder.  Drink enough fluid to keep your urine clear or pale yellow.  Keep all follow-up visits as told by your health care provider. This is important.  Make sure to:  Empty your bladder often and completely. Do not hold  urine for long periods of time.  Empty your bladder before and after sex.  Wipe from front to back after a bowel movement if you are female. Use each tissue one time when you wipe. Contact a health care provider if:  You have back pain.  You have a fever.  You feel nauseous or vomit.  Your symptoms do not get better after 3 days.  Your symptoms go away and then return. Get help right away if:  You have severe back pain or lower abdominal pain.  You are vomiting and cannot keep down any medicines or water. This information is not intended to replace advice given to you by your health care provider. Make sure you discuss any questions you have with your health care provider. Document Released: 01/31/2005 Document Revised: 10/05/2015 Document Reviewed: 03/14/2015 Elsevier Interactive Patient Education  2017 [employer].   IF you received an x-ray today, you will receive an invoice from [facility] Radiology. Please contact [facility] Radiology at [phone] with questions or concerns regarding your invoice.   IF you received labwork today, you will receive an invoice from [employer]. Please contact [facility] at [phone] with questions or concerns regarding your invoice.   Our billing staff will not be able to assist you with questions regarding bills from these companies.  You will be contacted with the lab results as soon as they are available. The fastest way to get your results is to activate your My Chart account. Instructions are located on the last page of this paperwork. If you have not heard from [place] regarding the results in 2 weeks, please contact this office.

## 2016-04-11 LAB — URINE CULTURE

## 2016-05-02 ENCOUNTER — Telehealth: Payer: Self-pay

## 2016-05-02 NOTE — Telephone Encounter (Signed)
Pt is completely out of her blood pressure medicaiton and is needing to check on status from the request that pharmacy requested on Saturday   Best number [phone]

## 2017-03-31 ENCOUNTER — Other Ambulatory Visit: Payer: Self-pay | Admitting: Family Medicine

## 2017-05-20 ENCOUNTER — Telehealth: Payer: Self-pay | Admitting: Family Medicine

## 2017-05-20 NOTE — Telephone Encounter (Signed)
Copied from [place] [phone]. Topic: General - Other >> May 20, 2017  4:53 PM Darl Householder, RMA wrote: Reason for CRM: Medication refill request for lisinopril 20-25 mg and verapamil 120 mg to be sent to [address]

## 2017-05-20 NOTE — Telephone Encounter (Signed)
Prescriptions state pt needs OV for future refills. OV scheduled for 06/26/17. Can pt have refills of Lisinopril 20 and Verapamil until seen for appt?

## 2017-05-23 NOTE — Telephone Encounter (Signed)
Pt may not have refill. Pt will need to be seen. 30 day supply already given and follow up scheduled over a month from now with open availability for Dr. Brigitte Pulse. Will allow for partial fill if scheduled within 2 weeks.

## 2017-06-26 ENCOUNTER — Encounter: Payer: BLUE CROSS/BLUE SHIELD | Admitting: Family Medicine

## 2017-09-18 ENCOUNTER — Other Ambulatory Visit: Payer: Self-pay

## 2017-09-18 ENCOUNTER — Ambulatory Visit (HOSPITAL_COMMUNITY)
Admission: EM | Admit: 2017-09-18 | Discharge: 2017-09-18 | Disposition: A | Discharge status: Home / Self Care | Payer: Managed Care, Other (non HMO) | Attending: Family Medicine | Admitting: Family Medicine

## 2017-09-18 ENCOUNTER — Encounter (HOSPITAL_COMMUNITY): Payer: Self-pay | Admitting: Emergency Medicine

## 2017-09-18 DIAGNOSIS — Z823 Family history of stroke: Secondary | ICD-10-CM | POA: Insufficient documentation

## 2017-09-18 DIAGNOSIS — Z833 Family history of diabetes mellitus: Secondary | ICD-10-CM | POA: Diagnosis not present

## 2017-09-18 DIAGNOSIS — M3313 Other dermatomyositis without myopathy: Secondary | ICD-10-CM | POA: Insufficient documentation

## 2017-09-18 DIAGNOSIS — Z8371 Family history of colonic polyps: Secondary | ICD-10-CM | POA: Insufficient documentation

## 2017-09-18 DIAGNOSIS — Z885 Allergy status to narcotic agent status: Secondary | ICD-10-CM | POA: Insufficient documentation

## 2017-09-18 DIAGNOSIS — Z86711 Personal history of pulmonary embolism: Secondary | ICD-10-CM | POA: Diagnosis not present

## 2017-09-18 DIAGNOSIS — N3090 Cystitis, unspecified without hematuria: Secondary | ICD-10-CM | POA: Diagnosis not present

## 2017-09-18 DIAGNOSIS — R350 Frequency of micturition: Secondary | ICD-10-CM

## 2017-09-18 DIAGNOSIS — Z9012 Acquired absence of left breast and nipple: Secondary | ICD-10-CM | POA: Insufficient documentation

## 2017-09-18 DIAGNOSIS — I10 Essential (primary) hypertension: Secondary | ICD-10-CM | POA: Diagnosis not present

## 2017-09-18 DIAGNOSIS — Z8542 Personal history of malignant neoplasm of other parts of uterus: Secondary | ICD-10-CM | POA: Insufficient documentation

## 2017-09-18 DIAGNOSIS — Z90710 Acquired absence of both cervix and uterus: Secondary | ICD-10-CM | POA: Insufficient documentation

## 2017-09-18 DIAGNOSIS — E669 Obesity, unspecified: Secondary | ICD-10-CM | POA: Insufficient documentation

## 2017-09-18 DIAGNOSIS — R3915 Urgency of urination: Secondary | ICD-10-CM

## 2017-09-18 DIAGNOSIS — I341 Nonrheumatic mitral (valve) prolapse: Secondary | ICD-10-CM | POA: Insufficient documentation

## 2017-09-18 DIAGNOSIS — Z853 Personal history of malignant neoplasm of breast: Secondary | ICD-10-CM | POA: Insufficient documentation

## 2017-09-18 LAB — POCT URINALYSIS DIP (DEVICE)
Bilirubin Urine: NEGATIVE
GLUCOSE, UA: NEGATIVE mg/dL
HGB URINE DIPSTICK: NEGATIVE
Ketones, ur: NEGATIVE mg/dL
Nitrite: NEGATIVE
PROTEIN: NEGATIVE mg/dL
UROBILINOGEN UA: 0.2 mg/dL (ref 0.0–1.0)
pH: 6.5 (ref 5.0–8.0)

## 2017-09-18 MED ORDER — CEPHALEXIN 500 MG PO CAPS: 500.0000 mg | ORAL_CAPSULE | Freq: Two times a day (BID) | ORAL | 0 refills | Status: DC

## 2017-09-18 NOTE — ED Triage Notes (Signed)
The patient presented to the [facility] with a complaint of lower abdominal pain and dysuria x 2 days.

## 2017-09-19 LAB — URINE CULTURE

## 2017-09-23 NOTE — ED Provider Notes (Signed)
[place]    ASSESSMENT & PLAN:  1. Cystitis     Meds ordered this encounter  Medications  . cephALEXin (KEFLEX) 500 MG capsule    Sig: Take 1 capsule (500 mg total) by mouth 2 (two) times daily.    Dispense:  10 capsule    Refill:  0   Urine culture sent. Will notify patient when results available. Will follow up with her PCP or here if not showing improvement over the next 48 hours, sooner if needed.  Outlined signs and symptoms indicating need for more acute intervention. Patient verbalized understanding. After Visit Summary given.  SUBJECTIVE:  Gail Schmitt is a 57 y.o. female who complains of urinary frequency, urgency and dysuria for the past 2 days. No flank pain, fever, chills, abnormal vaginal discharge or bleeding. Hematuria: not present. Normal PO intake. No abdominal pain. No self treatment. Ambulatory without difficulty.  LMP: No LMP recorded. Patient has had a hysterectomy.  ROS: As in HPI.  OBJECTIVE:  Vitals:   09/18/17 1119  BP: 133/80  Pulse: (!) 57  Resp: 20  Temp: 97.8 F (36.6 C)  TempSrc: Oral  SpO2: 98%   Appears well, in no apparent distress. Abdomen is soft without tenderness, guarding, mass, rebound or organomegaly. No CVA tenderness or inguinal adenopathy noted.  Labs Reviewed  URINE CULTURE - Abnormal; Notable for the following components:      Result Value   Culture MULTIPLE SPECIES PRESENT, SUGGEST RECOLLECTION (*)    All other components within normal limits  POCT URINALYSIS DIP (DEVICE) - Abnormal; Notable for the following components:   Leukocytes, UA TRACE (*)    All other components within normal limits    Allergies  Allergen Reactions  . Percocet [Oxycodone-Acetaminophen] Itching and Rash    Past Medical History:  Diagnosis Date  . Arthritis    "left hip; hands" (07/28/2014)  . Breast cancer, left breast ([place]) 1999   a. s/p left mastectomy  . Dermatomyositis ([place])    a. noted prior to diagnosis of  Breast CA ("i'm allergic to cancer")  . Dermatomyositis ([place])   . Family history of adverse reaction to anesthesia    "all my daughters get real sick and throw up alot"  . Headache    "maybe monthly" (07/28/2014)  . Heart murmur   . Hypertension   . Mitral valve prolapse   . Obesity   . Pneumonia    "haven't had it in the last 4 yrs; before that I had it q yr for 4-5 years" (07/28/2014)  . Pulmonary embolism ([place]) ~ 2003  . Unstable angina ([place])    a. Cath ~ 10 + yrs ago, [facility], [place] - "normal but pressures were high."  . Uterine cancer ([place]) 2007   Social History   Socioeconomic History  . Marital status: Divorced    Spouse name: Not on file  . Number of children: Not on file  . Years of education: Not on file  . Highest education level: Not on file  Occupational History  . Not on file  Social Needs  . Financial resource strain: Not on file  . Food insecurity:    Worry: Not on file    Inability: Not on file  . Transportation needs:    Medical: Not on file    Non-medical: Not on file  Tobacco Use  . Smoking status: Never Smoker  . Smokeless tobacco: Never Used  Substance and Sexual Activity  . Alcohol use: Yes  Alcohol/week: 0.6 oz    Types: 1 Standard drinks or equivalent per week    Comment: 07/28/2014 "might have 1 drink a couple times/month"  . Drug use: No  . Sexual activity: Not Currently  Lifestyle  . Physical activity:    Days per week: Not on file    Minutes per session: Not on file  . Stress: Not on file  Relationships  . Social connections:    Talks on phone: Not on file    Gets together: Not on file    Attends religious service: Not on file    Active member of club or organization: Not on file    Attends meetings of clubs or organizations: Not on file    Relationship status: Not on file  . Intimate partner violence:    Fear of current or ex partner: Not on file    Emotionally abused: Not on file    Physically abused: Not on file    Forced  sexual activity: Not on file  Other Topics Concern  . Not on file  Social History Narrative   Lives @ home in [place] with 1 of her 3 dtrs.  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  Doesn't follow any particular diet.   Family History  Problem Relation Age of Onset  . Diabetes type II Mother        alive @ 53  . Colon polyps Mother   . Diabetes Sister   . Stroke Brother   . Colon cancer Neg Hx        Vanessa Kick, MD 09/23/17 1000

## 2017-09-26 ENCOUNTER — Encounter: Payer: Self-pay | Admitting: *Deleted

## 2017-10-27 ENCOUNTER — Encounter: Payer: Self-pay | Admitting: Internal Medicine

## 2017-12-19 ENCOUNTER — Other Ambulatory Visit: Payer: Self-pay

## 2017-12-19 ENCOUNTER — Emergency Department (HOSPITAL_COMMUNITY): Payer: Managed Care, Other (non HMO)

## 2017-12-19 ENCOUNTER — Observation Stay (HOSPITAL_COMMUNITY)
Admission: EM | Admit: 2017-12-19 | Discharge: 2017-12-21 | Disposition: A | Discharge status: Home / Self Care | Payer: Managed Care, Other (non HMO) | Attending: Cardiovascular Disease | Admitting: Cardiovascular Disease

## 2017-12-19 ENCOUNTER — Observation Stay (HOSPITAL_COMMUNITY): Payer: Managed Care, Other (non HMO)

## 2017-12-19 ENCOUNTER — Encounter (HOSPITAL_COMMUNITY): Payer: Self-pay | Admitting: Emergency Medicine

## 2017-12-19 DIAGNOSIS — Z853 Personal history of malignant neoplasm of breast: Secondary | ICD-10-CM | POA: Insufficient documentation

## 2017-12-19 DIAGNOSIS — R079 Chest pain, unspecified: Secondary | ICD-10-CM

## 2017-12-19 DIAGNOSIS — I10 Essential (primary) hypertension: Secondary | ICD-10-CM | POA: Insufficient documentation

## 2017-12-19 DIAGNOSIS — Z6835 Body mass index (BMI) 35.0-35.9, adult: Secondary | ICD-10-CM | POA: Diagnosis not present

## 2017-12-19 DIAGNOSIS — K760 Fatty (change of) liver, not elsewhere classified: Secondary | ICD-10-CM | POA: Diagnosis present

## 2017-12-19 DIAGNOSIS — I209 Angina pectoris, unspecified: Principal | ICD-10-CM | POA: Insufficient documentation

## 2017-12-19 DIAGNOSIS — I200 Unstable angina: Secondary | ICD-10-CM | POA: Diagnosis not present

## 2017-12-19 DIAGNOSIS — R0609 Other forms of dyspnea: Secondary | ICD-10-CM

## 2017-12-19 DIAGNOSIS — E669 Obesity, unspecified: Secondary | ICD-10-CM | POA: Diagnosis present

## 2017-12-19 DIAGNOSIS — Z79899 Other long term (current) drug therapy: Secondary | ICD-10-CM | POA: Diagnosis not present

## 2017-12-19 LAB — HEPATIC FUNCTION PANEL
ALBUMIN: 3.4 g/dL — AB (ref 3.5–5.0)
ALT: 17 U/L (ref 0–44)
AST: 22 U/L (ref 15–41)
Alkaline Phosphatase: 59 U/L (ref 38–126)
BILIRUBIN TOTAL: 0.8 mg/dL (ref 0.3–1.2)
Bilirubin, Direct: <0.1 mg/dL (ref 0.0–0.2)
Total Protein: 7.1 g/dL (ref 6.5–8.1)

## 2017-12-19 LAB — BASIC METABOLIC PANEL
ANION GAP: 10 (ref 5–15)
BUN: 19 mg/dL (ref 6–20)
CALCIUM: 9.2 mg/dL (ref 8.9–10.3)
CO2: 24 mmol/L (ref 22–32)
Chloride: 105 mmol/L (ref 98–111)
Creatinine, Ser: 0.93 mg/dL (ref 0.44–1.00)
GFR calc Af Amer: >60 mL/min (ref >60)
GLUCOSE: 117 mg/dL — AB (ref 70–99)
Potassium: 3.7 mmol/L (ref 3.5–5.1)
SODIUM: 139 mmol/L (ref 135–145)

## 2017-12-19 LAB — I-STAT TROPONIN, ED: TROPONIN I, POC: 0 ng/mL (ref 0.00–0.08)

## 2017-12-19 LAB — I-STAT BETA HCG BLOOD, ED (MC, WL, AP ONLY): I-stat hCG, quantitative: <5 m[IU]/mL (ref <5)

## 2017-12-19 LAB — CBC
HCT: 39.6 % (ref 36.0–46.0)
Hemoglobin: 12.4 g/dL (ref 12.0–15.0)
MCH: 28.3 pg (ref 26.0–34.0)
MCHC: 31.3 g/dL (ref 30.0–36.0)
MCV: 90.4 fL (ref 78.0–100.0)
Platelets: 235 10*3/uL (ref 150–400)
RBC: 4.38 MIL/uL (ref 3.87–5.11)
RDW: 13.3 % (ref 11.5–15.5)
WBC: 4.2 10*3/uL (ref 4.0–10.5)

## 2017-12-19 LAB — BRAIN NATRIURETIC PEPTIDE: B NATRIURETIC PEPTIDE 5: 159.8 pg/mL — AB (ref 0.0–100.0)

## 2017-12-19 LAB — HEMOGLOBIN A1C
Hgb A1c MFr Bld: 5.9 % — ABNORMAL HIGH (ref 4.8–5.6)
Mean Plasma Glucose: 122.63 mg/dL

## 2017-12-19 LAB — TROPONIN I

## 2017-12-19 LAB — HEPARIN LEVEL (UNFRACTIONATED): HEPARIN UNFRACTIONATED: 0.57 [IU]/mL (ref 0.30–0.70)

## 2017-12-19 LAB — MRSA PCR SCREENING: MRSA by PCR: NEGATIVE

## 2017-12-19 LAB — D-DIMER, QUANTITATIVE (NOT AT ARMC): D DIMER QUANT: 0.77 ug{FEU}/mL — AB (ref 0.00–0.50)

## 2017-12-19 LAB — TSH: TSH: 1.206 u[IU]/mL (ref 0.350–4.500)

## 2017-12-19 LAB — PROTIME-INR
INR: 1.07
Prothrombin Time: 13.8 seconds (ref 11.4–15.2)

## 2017-12-19 LAB — CBG MONITORING, ED: Glucose-Capillary: 83 mg/dL (ref 70–99)

## 2017-12-19 MED ORDER — HYDROCHLOROTHIAZIDE 25 MG PO TABS
25.0000 mg | ORAL_TABLET | Freq: Every day | ORAL | Status: DC
  Administered 2017-12-19 – 2017-12-21 (×3): 25 mg via ORAL
  Filled 2017-12-19 (×3): qty 1

## 2017-12-19 MED ORDER — IOPAMIDOL (ISOVUE-370) INJECTION 76%
100.0000 mL | Freq: Once | INTRAVENOUS | Status: AC | PRN
  Administered 2017-12-19: 100 mL via INTRAVENOUS

## 2017-12-19 MED ORDER — IOPAMIDOL (ISOVUE-370) INJECTION 76%
INTRAVENOUS | Status: AC
  Filled 2017-12-19: qty 100

## 2017-12-19 MED ORDER — SODIUM CHLORIDE 0.9% FLUSH: 3.0000 mL | INTRAVENOUS | Status: DC | PRN

## 2017-12-19 MED ORDER — ONDANSETRON HCL 4 MG/2ML IJ SOLN: 4.0000 mg | Freq: Four times a day (QID) | INTRAMUSCULAR | Status: DC | PRN

## 2017-12-19 MED ORDER — HEPARIN (PORCINE) IN NACL 100-0.45 UNIT/ML-% IJ SOLN
1200.0000 [IU]/h | INTRAMUSCULAR | Status: DC
  Administered 2017-12-19 – 2017-12-21 (×3): 1200 [IU]/h via INTRAVENOUS
  Filled 2017-12-19 (×2): qty 250

## 2017-12-19 MED ORDER — NITROGLYCERIN 2 % TD OINT
0.5000 [in_us] | TOPICAL_OINTMENT | Freq: Once | TRANSDERMAL | Status: AC
  Administered 2017-12-19: 0.5 [in_us] via TOPICAL
  Filled 2017-12-19: qty 1

## 2017-12-19 MED ORDER — SODIUM CHLORIDE 0.9% FLUSH
3.0000 mL | Freq: Two times a day (BID) | INTRAVENOUS | Status: DC
  Administered 2017-12-19 – 2017-12-21 (×4): 3 mL via INTRAVENOUS

## 2017-12-19 MED ORDER — ASPIRIN EC 81 MG PO TBEC
81.0000 mg | DELAYED_RELEASE_TABLET | Freq: Every day | ORAL | Status: DC
  Administered 2017-12-20 – 2017-12-21 (×2): 81 mg via ORAL
  Filled 2017-12-19 (×2): qty 1

## 2017-12-19 MED ORDER — SODIUM CHLORIDE 0.9 % IV SOLN: 250.0000 mL | INTRAVENOUS | Status: DC | PRN

## 2017-12-19 MED ORDER — ASPIRIN 81 MG PO CHEW: 324.0000 mg | CHEWABLE_TABLET | Freq: Once | ORAL | Status: DC

## 2017-12-19 MED ORDER — ASPIRIN 81 MG PO CHEW
324.0000 mg | CHEWABLE_TABLET | Freq: Once | ORAL | Status: AC
  Administered 2017-12-19: 324 mg via ORAL
  Filled 2017-12-19: qty 4

## 2017-12-19 MED ORDER — NITROGLYCERIN 0.4 MG SL SUBL
0.4000 mg | SUBLINGUAL_TABLET | SUBLINGUAL | Status: DC | PRN
  Administered 2017-12-19 – 2017-12-20 (×3): 0.4 mg via SUBLINGUAL
  Administered 2017-12-20: 0.8 mg via SUBLINGUAL
  Filled 2017-12-19 (×4): qty 1

## 2017-12-19 MED ORDER — HEPARIN BOLUS VIA INFUSION
4000.0000 [IU] | Freq: Once | INTRAVENOUS | Status: AC
  Administered 2017-12-19: 4000 [IU] via INTRAVENOUS
  Filled 2017-12-19: qty 4000

## 2017-12-19 MED ORDER — LISINOPRIL 40 MG PO TABS
40.0000 mg | ORAL_TABLET | Freq: Every day | ORAL | Status: DC
  Administered 2017-12-19 – 2017-12-21 (×3): 40 mg via ORAL
  Filled 2017-12-19 (×3): qty 1

## 2017-12-19 MED ORDER — ADULT MULTIVITAMIN W/MINERALS CH
1.0000 | ORAL_TABLET | Freq: Every day | ORAL | Status: DC
  Administered 2017-12-20 – 2017-12-21 (×2): 1 via ORAL
  Filled 2017-12-19 (×2): qty 1

## 2017-12-19 NOTE — ED Provider Notes (Signed)
[facility] EMERGENCY DEPARTMENT Provider Note   CSN: [id number] Arrival date & time: 12/19/17  0258     History   Chief Complaint Chief Complaint  Patient presents with  . Chest Pain    HPI Gail Schmitt is a 57 y.o. female who presents the emergency department chief complaint of chest pain.  Patient had onset of exertional chest pain and shortness of breath starting about a month ago.  Patient states that if she did walk for certain distance she would get pain on the left side of her chest which was pressure-like associated shortness of breath which was only mild but would resolve easily with rest.  She states that over the past 2 weeks it has been progressively worsening.  Today the patient had onset of left-sided chest pain at rest which she described as crushing, pressure-like with associated shortness of breath, sweating.  Patient states that she decided she would try to go to work and as she was trying to walk up the ramp to work her pain became severe.  She was breathing extremely heavily, felt like "an elephant was sitting on my chest," and states that her legs felt like she was trying to move the quicksand.  She is continually having chest pain at rest.  She denies pleuritic chest pain, hemoptysis, unilateral leg swelling.  She denies feelings of presyncope.  Of note the patient is having intermittent bradycardia cardia at which point she grasps her chest.  She is not on a beta-blocker.  HPI  Past Medical History:  Diagnosis Date  . Arthritis    "left hip; hands" (07/28/2014)  . Breast cancer, left breast ([place]) 1999   a. s/p left mastectomy  . Dermatomyositis ([place])    a. noted prior to diagnosis of Breast CA ("i'm allergic to cancer")  . Family history of adverse reaction to anesthesia    "all my daughters get real sick and throw up alot"  . Headache    "maybe monthly" (07/28/2014)  . Heart murmur   . Hypertension   . Mitral valve prolapse   . Obesity     . Pneumonia    "haven't had it in the last 4 yrs; before that I had it q yr for 4-5 years" (07/28/2014)  . Pulmonary embolism ([place]) ~ 2003  . Unstable angina ([place])    a. Cath ~ 10 + yrs ago, [facility], [place] - "normal but pressures were high."  . Uterine cancer [facility]) 2007    Patient Active Problem List   Diagnosis Date Noted  . Unstable angina ([place]) 12/19/2017  . CN (constipation) 10/22/2014  . Abnormal nuclear stress test   . Biliary colic 52/77/8242  . Symptomatic cholelithiasis 07/06/2014  . Vitamin D deficiency 06/23/2014  . Fatty infiltration of liver 06/23/2014  . Unstable angina pectoris ([place]) 07/13/2011  . Chest pain, midsternal   . Obesity   . Chest pain 07/12/2011  . HTN (hypertension) 07/12/2011    Past Surgical History:  Procedure Laterality Date  . BREAST BIOPSY Left 1999  . CARDIAC CATHETERIZATION  07/13/2011  . CHOLECYSTECTOMY N/A 07/31/2014   Procedure: LAPAROSCOPIC CHOLECYSTECTOMY WITH INTRAOPERATIVE CHOLANGIOGRAM;  Surgeon: Erroll Luna, MD;  Location: [facility];  Service: General;  Laterality: N/A;  . INGUINAL HERNIA REPAIR Left 1986  . LAPAROSCOPIC TOTAL HYSTERECTOMY    . LEFT HEART CATHETERIZATION WITH CORONARY ANGIOGRAM N/A 07/13/2011   Procedure: LEFT HEART CATHETERIZATION WITH CORONARY ANGIOGRAM;  Surgeon: Josue Hector, MD;  Location: [facility] CATH  LAB;  Service: Cardiovascular;  Laterality: N/A;  . LEFT HEART CATHETERIZATION WITH CORONARY ANGIOGRAM N/A 07/30/2014   Procedure: LEFT HEART CATHETERIZATION WITH CORONARY ANGIOGRAM;  Surgeon: [name], MD;  Location: [facility] CATH LAB;  Service: Cardiovascular;  Laterality: N/A;  . MASTECTOMY Left 1999  . RECONSTRUCTION BREAST IMMEDIATE / DELAYED W/ TISSUE EXPANDER Left 2005  . TOTAL ABDOMINAL HYSTERECTOMY  2007  . TUBAL LIGATION  1990     OB History   None      Home Medications    Prior to Admission medications   Medication Sig Start Date End Date Taking? Authorizing Provider  calcium carbonate (TUMS  EX) 750 MG chewable tablet Chew 2 tablets by mouth as needed for heartburn.   Yes [provider]  ibuprofen (ADVIL,MOTRIN) 400 MG tablet Take 400 mg by mouth every 6 (six) hours as needed.   Yes [provider]  loperamide (IMODIUM) 2 MG capsule Take 4 mg by mouth as needed for diarrhea or loose stools.   Yes [provider]  Misc Natural Products (7-KETO LEAN) CAPS Take 1 capsule by mouth daily.   Yes [provider]  Multiple Vitamin (MULTIVITAMIN WITH MINERALS) TABS tablet Take 1 tablet by mouth daily.   Yes [provider]  verapamil (CALAN) 120 MG tablet Take 1 tablet (120 mg total) by mouth 2 (two) times daily. Office visit needed for refills 04/01/17  Yes Shawnee Knapp, MD  lisinopril (PRINIVIL,ZESTRIL) 10 MG tablet Take 10 mg by mouth daily.    07/12/11  [provider]    Family History Family History  Problem Relation Age of Onset  . Diabetes type II Mother        alive @ 47  . Colon polyps Mother   . Diabetes Sister   . Stroke Brother   . Colon cancer Neg Hx     Social History Social History   Tobacco Use  . Smoking status: Never Smoker  . Smokeless tobacco: Never Used  Substance Use Topics  . Alcohol use: Yes    Alcohol/week: 1.0 standard drinks    Types: 1 Standard drinks or equivalent per week    Comment: 07/28/2014 "might have 1 drink a couple times/month"  . Drug use: No     Allergies   Percocet [oxycodone-acetaminophen]   Review of Systems Review of Systems  Ten systems reviewed and are negative for acute change, except as noted in the HPI.   Physical Exam Updated Vital Signs BP 104/77   Pulse 64   Temp 97.7 F (36.5 C) (Oral)   Resp 15   Ht 5\' 5"  (1.651 m)   Wt 124.7 kg   SpO2 99%   BMI 45.76 kg/m   Physical Exam  Constitutional: She is oriented to person, place, and time. She appears well-developed and well-nourished. No distress.  HENT:  Head: Normocephalic and atraumatic.  Eyes:  Conjunctivae are normal. No scleral icterus.  Neck: Normal range of motion.  Cardiovascular: Normal rate, regular rhythm, normal heart sounds and normal pulses. Exam reveals no gallop and no friction rub.  No murmur heard. Pulmonary/Chest: Effort normal and breath sounds normal. No respiratory distress.  Abdominal: Soft. Bowel sounds are normal. She exhibits no distension and no mass. There is no tenderness. There is no guarding.  Musculoskeletal: Normal range of motion.       Right lower leg: She exhibits edema.       Left lower leg: She exhibits edema.  Trace pitting edema bilaterally,  equal circumferential swelling  Neurological: She is alert and oriented to person, place, and time.  Skin: Skin is warm and dry. She is not diaphoretic.  Psychiatric: Her behavior is normal.  Nursing note and vitals reviewed.    ED Treatments / Results  Labs (all labs ordered are listed, but only abnormal results are displayed) Labs Reviewed  BASIC METABOLIC PANEL - Abnormal; Notable for the following components:      Result Value   Glucose, Bld 117 (*)    All other components within normal limits  D-DIMER, QUANTITATIVE (NOT AT [facility]) - Abnormal; Notable for the following components:   D-Dimer, Quant 0.77 (*)    All other components within normal limits  CBC  PROTIME-INR  HEPARIN LEVEL (UNFRACTIONATED)  BRAIN NATRIURETIC PEPTIDE  I-STAT TROPONIN, ED  I-STAT BETA HCG BLOOD, ED ([facility], [facility], [facility] ONLY)  CBG MONITORING, ED  I-STAT BETA HCG BLOOD, ED ([facility], [facility], [facility] ONLY)    EKG EKG Interpretation  Date/Time:  Thursday December 19 2017 08:12:15 EDT Ventricular Rate:  60 PR Interval:    QRS Duration: 84 QT Interval:  458 QTC Calculation: 458 R Axis:   68 Text Interpretation:  Marked sinus bradycardia with A-V dissociation and Junctional rhythm with Sinus/atrial capture Nonspecific ST abnormality Abnormal ECG Confirmed by Fredia Sorrow [phone]) on 12/19/2017 4:51:35 PM   Radiology Dg Chest 2  View  Result Date: 12/19/2017 CLINICAL DATA:  Chest pain and shortness of breath EXAM: CHEST - 2 VIEW COMPARISON:  February 21, 2016 FINDINGS: There is no edema or consolidation. The heart size and pulmonary vascularity are normal. No adenopathy. The patient has had a previous mastectomy on the left. No evident bone lesions. IMPRESSION: No edema or consolidation.  Status post left-sided mastectomy. Electronically Signed   By: Lowella Grip III M.D.   On: 12/19/2017 09:04    Procedures .Critical Care Performed by: Margarita Mail, PA-C Authorized by: Margarita Mail, PA-C   Critical care provider statement:    Critical care time (minutes):  60   Critical care was necessary to treat or prevent imminent or life-threatening deterioration of the following conditions:  Cardiac failure   Critical care was time spent personally by me on the following activities:  Discussions with consultants, evaluation of patient's response to treatment, examination of patient, ordering and performing treatments and interventions, ordering and review of laboratory studies, ordering and review of radiographic studies, pulse oximetry, re-evaluation of patient's condition, obtaining history from patient or surrogate and review of old charts   (including critical care time)  Medications Ordered in ED Medications  heparin ADULT infusion 100 units/mL (25000 units/229mL sodium chloride 0.45%) (1,200 Units/hr Intravenous Transfusing/Transfer 12/19/17 1647)  nitroGLYCERIN (NITROSTAT) SL tablet 0.4 mg (has no administration in time range)  aspirin chewable tablet 324 mg (324 mg Oral Given 12/19/17 1133)  nitroGLYCERIN (NITROGLYN) 2 % ointment 0.5 inch (0.5 inches Topical Given 12/19/17 1135)  heparin bolus via infusion 4,000 Units (4,000 Units Intravenous Bolus from Bag 12/19/17 1252)     Initial Impression / Assessment and Plan / ED Course  I have reviewed the triage vital signs and the nursing notes.  Pertinent labs &  imaging results that were available during my care of the patient were reviewed by me and considered in my medical decision making (see chart for details).  Clinical Course as of Dec 20 1650  Thu Dec 19, 2017  1134 Consulted with cards    [AH]  5284 EKG shows bigeminy and bradycardia without signs  of acute ischemia.   [AH]  7290 Patient's initial troponin is negative.  Her glucose is only mildly elevated.   [AH]  2111 I have reviewed the patient's PA and lateral chest x-ray and agree with the radiologic interpretation.  There is no acute abnormality including consolidations or widened mediastinum.   [AH]  1137 Patient with symptoms concerning for unstable angina.  Currently still having chest pain.  I have ordered treatment for ACS.  I have also consulted with cardiology.  Although pulmonary embolus is still on the differential I have very low suspicion for this.  The patient is bradycardic and her symptoms do sound cardiac in nature.   [AH]    Clinical Course User Index [AH] Margarita Mail, PA-C    Patient here with symptoms concerning for unstable angina.  She has an abnormal EKG which shows bradycardia, bigeminy, and nonspecific ST changes.  Given the patient's very concerning history I began the patient immediately on aspirin and heparin and consulted with cardiology for treatment of acute coronary syndrome.  Cardiology has seen the patient here in the emergency department and will admit her.  She is stable throughout her visit.  Her pain is improved with treatment here in the ED.  Final Clinical Impressions(s) / ED Diagnoses   Final diagnoses:  Unstable angina [facility])    ED Discharge Orders    None       Margarita Mail, PA-C 12/19/17 1653    Fredia Sorrow, MD 12/25/17 [phone]

## 2017-12-19 NOTE — ED Notes (Signed)
MD paged at provided number for D-dimer results.

## 2017-12-19 NOTE — H&P (Addendum)
History & Physical    Patient ID: Gail Schmitt MRN: [id number], DOB/AGE: May 02, 1961   Admit date: 12/19/2017   Primary Physician: Shawnee Knapp, MD Primary Cardiologist: No primary care provider on file.  Patient Profile    57 yo female with past medical history significant for HTN (verapamil, lisinopril-HCTZ, new Bystolic), possible h/o ? PE ([place], [place] treated with Coumadin), h/o breast cancer (s/p L sided mastectomy), UA (s/p negative cath 2013, 2016; TTE EF 55-60% 2013, 2016), remote history of mitral valve prolapse, s/p cholecystectomy, chronic chest pain, and family history of adverse reaction to anesthesia.  Past Medical History    Past Medical History:  Diagnosis Date  . Arthritis    "left hip; hands" (07/28/2014)  . Breast cancer, left breast ([place]) 1999   a. s/p left mastectomy  . Dermatomyositis ([place])    a. noted prior to diagnosis of Breast CA ("i'm allergic to cancer")  . Family history of adverse reaction to anesthesia    "all my daughters get real sick and throw up alot"  . Headache    "maybe monthly" (07/28/2014)  . Heart murmur   . Hypertension   . Mitral valve prolapse   . Obesity   . Pneumonia    "haven't had it in the last 4 yrs; before that I had it q yr for 4-5 years" (07/28/2014)  . Pulmonary embolism ([place]) ~ 2003  . Unstable angina ([place])    a. Cath ~ 10 + yrs ago, [facility], [place] - "normal but pressures were high."  . Uterine cancer ([place]) 2007    Past Surgical History:  Procedure Laterality Date  . BREAST BIOPSY Left 1999  . CARDIAC CATHETERIZATION  07/13/2011  . CHOLECYSTECTOMY N/A 07/31/2014   Procedure: LAPAROSCOPIC CHOLECYSTECTOMY WITH INTRAOPERATIVE CHOLANGIOGRAM;  Surgeon: Erroll Luna, MD;  Location: [facility];  Service: General;  Laterality: N/A;  . INGUINAL HERNIA REPAIR Left 1986  . LAPAROSCOPIC TOTAL HYSTERECTOMY    . LEFT HEART CATHETERIZATION WITH CORONARY ANGIOGRAM N/A 07/13/2011   Procedure: LEFT HEART CATHETERIZATION WITH CORONARY  ANGIOGRAM;  Surgeon: Josue Hector, MD;  Location: [facility] CATH LAB;  Service: Cardiovascular;  Laterality: N/A;  . LEFT HEART CATHETERIZATION WITH CORONARY ANGIOGRAM N/A 07/30/2014   Procedure: LEFT HEART CATHETERIZATION WITH CORONARY ANGIOGRAM;  Surgeon: [name], MD;  Location: [facility] CATH LAB;  Service: Cardiovascular;  Laterality: N/A;  . MASTECTOMY Left 1999  . RECONSTRUCTION BREAST IMMEDIATE / DELAYED W/ TISSUE EXPANDER Left 2005  . TOTAL ABDOMINAL HYSTERECTOMY  2007  . TUBAL LIGATION  1990     Allergies  Allergies  Allergen Reactions  . Percocet [Oxycodone-Acetaminophen] Itching and Rash    History of Present Illness   Patient with past medical history as above and including a history of pulmonary embolism. Patient reports 2 past blood clots in her lungs and on further questioning reports the most recent occurred in [place] ~2003 & anticoagulation with Coumadin. Patient with prior history of chest pain and underwent cardiac catheterization as below on 07/13/11 that proved negative for CAD. Echo in 07/13/11 showed mild LVH, normal EF, and was negative for valvular disease as below in cardiac studies. She was most recently seen by Dr. Burt Knack of [facility] 07/29/2014 after presenting to [facility] ED with c/o RUQ abdominal pain. She was found to have gallstones & underwent cholecystectomy. Her chest pain during this admission (07/2014) was described as left sided with radiation to left shoulder and similar to the chest pain that resulted in  cardiac catheterization on 07/28/2014, also negative for CAD. A myoview study showed intermediate risk stress test findings as below and cardiac catheterization was recommended in the setting of reported chest pain with intermediate risk stress test and again negative for CAD / no cors.   Per patient report, she visited her PCP ~10 days ago due to ankle edema and high blood pressure and received 14 days of trial / sample Bystolic to take in addition to  her verapamil and lisinopril-HCTZ. She reports taking lisinopril - HCTZ and verapamil for years due to elevated blood pressure but states she is only on day 10 of 14 of the Bystolic trial medication. In addition, she reports recently starting an OTC medication / supplement for weight loss, called Keto (as seen on shark tank) with ingredients listed as beta-hydroxybutyrate acid. She reports this is her fourth day taking this supplement. She reports she has palpitations, racing heart rate, and insomnia with caffeine and is not certain if this supplement contains caffeine or interacts with her current medications.   On 12/19/2017, patient reports a worsening non-radiating episode of chest pressure on exertion described as substernal and extending under her left breast (~5th ICS) and around her side to under her left scapula. She states that this chest pressure became worse with lying down and deep breaths and was not alleviated with rest like her past episodes. It reportedly started after dropping off her grandchildren and on the way walking back to her car. The pressure / pain was similar to episodes in the past with new symptoms of worsening SOB, diaphoresis, near syncope symptoms (dizziness & feeling she may pass out), b/l leg weakness, and fatigue. She denies any nausea or headache at that time. She denies recent emesis or diarrhea. She reportedly drove to work despite her symptoms but decided to come to the ED after her symptoms worsened while walking up a ramp at work and still did not alleviate with rest. She does report worsening DOE, ankle edema, and episodes of chest pressure similar to this over the last month and occurring with exertion. She however states that this episode is unique in that it is the worst (rated 3-4 of 10 & currently 1-2 of 10) and notes this episode also has newly associated SOB, diaphoresis, near-syncope symptoms, and leg heaviness. She reports that each of these chest pressure  episodes is followed by a feeling of fatigue. She further reports that this is the first chest pressure episode not alleviated with rest. She has reportedly recently gained 10-15 lbs since March 2019 due to a change in job position. She denies recent travel or hormonal therapy. She drinks alcohol ~3-6x/ month and does not smoke cigarettes or do any illicit drugs.  While in the ED, patient was noted to have symptomatic bradycardia and grabbed her chest in pain with brady rates. She was started on heparin and ASA. Vitals significant for HR 44-66, T 97.7, RR ?7-21, BP 104-152 /59-99 and 98-100% ORA. Labs significant for Na 139, K 3.7, glucose 117, Cr 0.93, Ca 9.2, WBC 4.2, Hgb 12.4, negative pregnancy test. EKG with RBBB and junctional rhythm with PVCs. CXR without signs of cardiopulmonary disease and s/p L sided mastectomy.    Home Medications    Prior to Admission medications   Medication Sig Start Date End Date Taking? Authorizing Provider  cephALEXin (KEFLEX) 500 MG capsule Take 1 capsule (500 mg total) by mouth 2 (two) times daily. 09/18/17   Vanessa Kick, MD  ibuprofen (ADVIL,MOTRIN) 400 MG  tablet Take 400 mg by mouth every 6 (six) hours as needed.    [provider]  lisinopril-hydrochlorothiazide (PRINZIDE,ZESTORETIC) 20-25 MG tablet TAKE ONE TABLET BY MOUTH IN THE MORNING 04/01/17   Shawnee Knapp, MD  Methylcobalamin (B12-ACTIVE PO) Take by mouth daily.    [provider]  verapamil (CALAN) 120 MG tablet Take 1 tablet (120 mg total) by mouth 2 (two) times daily. Office visit needed for refills 04/01/17   Shawnee Knapp, MD  VITAMIN D, ERGOCALCIFEROL, PO Take by mouth.    [provider]  lisinopril (PRINIVIL,ZESTRIL) 10 MG tablet Take 10 mg by mouth daily.    07/12/11  [provider]    Family History    Family History  Problem Relation Age of Onset  . Diabetes type II Mother        alive @ 46  . Colon polyps Mother   . Diabetes Sister   . Stroke Brother     . Colon cancer Neg Hx    She reported the following about her mother: alive @ 52 - DM. She reported the following about her father: alive @ ?23 - no contact with him. She indicated that the status of her sister is unknown. She indicated that the status of her brother is unknown. She indicated that the status of her neg hx is unknown.   Social History    Social History   Socioeconomic History  . Marital status: Divorced    Spouse name: Not on file  . Number of children: Not on file  . Years of education: Not on file  . Highest education level: Not on file  Occupational History  . Not on file  Social Needs  . Financial resource strain: Not on file  . Food insecurity:    Worry: Not on file    Inability: Not on file  . Transportation needs:    Medical: Not on file    Non-medical: Not on file  Tobacco Use  . Smoking status: Never Smoker  . Smokeless tobacco: Never Used  Substance and Sexual Activity  . Alcohol use: Yes    Alcohol/week: 1.0 standard drinks    Types: 1 Standard drinks or equivalent per week    Comment: 07/28/2014 "might have 1 drink a couple times/month"  . Drug use: No  . Sexual activity: Not Currently  Lifestyle  . Physical activity:    Days per week: Not on file    Minutes per session: Not on file  . Stress: Not on file  Relationships  . Social connections:    Talks on phone: Not on file    Gets together: Not on file    Attends religious service: Not on file    Active member of club or organization: Not on file    Attends meetings of clubs or organizations: Not on file    Relationship status: Not on file  . Intimate partner violence:    Fear of current or ex partner: Not on file    Emotionally abused: Not on file    Physically abused: Not on file    Forced sexual activity: Not on file  Other Topics Concern  . Not on file  Social History Narrative   Lives @ home in [place] with 1 of her 3 dtrs.  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  Relatively  acitve @ work but doesn't exercise @ home.  Doesn't follow any particular diet.     Review of Systems  General:  No chills, fever, night sweats or +++weight gain + 10-15 lbs since march.  Cardiovascular:  +++chest pain, ++++dyspnea on exertion, no edema, orthopnea, +++intermittent palpitations, no paroxysmal nocturnal dyspnea.  Dermatological: No rash, lesions/masses Respiratory: No cough, dyspnea Urologic: No hematuria, dysuria Abdominal:   No nausea, vomiting, diarrhea, bright red blood per rectum, melena, or hematemesis +++reported constipation. Neurologic:  No visual changes, +++ b/l leg wkns / heaviness, +++ feeling of dizziness / near syncope symptoms, no changes in mental status. All other systems reviewed and are otherwise negative except as noted above.  Physical Exam    Blood pressure 119/64, pulse (!) 53, temperature 97.7 F (36.5 C), temperature source Oral, resp. rate 13, height 5\' 5"  (1.651 m), weight 124.7 kg, SpO2 99 %.  General: Pleasant, NAD Psych: Normal affect. Neuro: Alert and oriented X 3. Moves all extremities spontaneously. HEENT: Normal  Neck: Supple without bruits. +++JVD. Lungs:  Resp regular and unlabored, CTA. Heart: bradycardic. no s3, s4, or murmurs. Abdomen: Soft, non-tender, non-distended, BS + x 4.  Extremities: No clubbing, cyanosis. 1+ LEE bilaterally. DP/PT/Radials 2+ and equal bilaterally.  Labs    Troponin [facility] Test) Recent Labs    12/19/17 0843  TROPIPOC 0.00   No results for input(s): CKTOTAL, CKMB, TROPONINI in the last 72 hours. Lab Results  Component Value Date   WBC 4.2 12/19/2017   HGB 12.4 12/19/2017   HCT 39.6 12/19/2017   MCV 90.4 12/19/2017   PLT 235 12/19/2017    Recent Labs  Lab 12/19/17 0822  NA 139  K 3.7  CL 105  CO2 24  BUN 19  CREATININE 0.93  CALCIUM 9.2  GLUCOSE 117*   Lab Results  Component Value Date   CHOL 159 01/19/2016   HDL 53 01/19/2016   LDLCALC 87 01/19/2016   TRIG 97  01/19/2016   Lab Results  Component Value Date   DDIMER (H) 04/06/2010    0.74        AT THE INHOUSE ESTABLISHED CUTOFF VALUE OF 0.48 ug/mL FEU, THIS ASSAY HAS BEEN DOCUMENTED IN THE LITERATURE TO HAVE A SENSITIVITY AND NEGATIVE PREDICTIVE VALUE OF AT LEAST 98 TO 99%.  THE TEST RESULT SHOULD BE CORRELATED WITH AN ASSESSMENT OF THE CLINICAL PROBABILITY OF DVT / VTE.     Radiology Studies    Dg Chest 2 View  Result Date: 12/19/2017 CLINICAL DATA:  Chest pain and shortness of breath EXAM: CHEST - 2 VIEW COMPARISON:  February 21, 2016 FINDINGS: There is no edema or consolidation. The heart size and pulmonary vascularity are normal. No adenopathy. The patient has had a previous mastectomy on the left. No evident bone lesions. IMPRESSION: No edema or consolidation.  Status post left-sided mastectomy. Electronically Signed   By: Lowella Grip III M.D.   On: 12/19/2017 09:04    ECG & Cardiac Imaging    07/28/2014 Myoview IMPRESSION: 1. Separate small reversible defects present at the anterior wall and inferior wall mid ventricle. 2. Normal left ventricular wall motion. 3. Left ventricular ejection fraction 56% 4. Intermediate-risk stress test findings*. 5. Raw images demonstrate a small focus of uptake of radiotracer at the anterior left wall in the region of left breast reconstruction. This could represent contamination, however, alternative differential diagnosis includes uptake in inflammation/infection or malignancy. Correlation with physical exam for contamination as well as any prior mammography is recommended.  07/30/2014  LHC 1. No significant coronary artery disease. 2. Normal left ventricular systolic function.  LVEDP 18  mmHg.  Ejection fraction 55%. 3.  False positive nuclear stress test.  07/13/2011 TTE  Left ventricle: Wall thickness was increased in a pattern of mild LVH.  Systolic function was normal. The estimated ejection fraction was in the range of  55% to 60%.  07/13/2011 LHC Impression: No CAD. Normal cors.   Assessment & Plan    1.Chest Pain and DOE with h/o ? PE vs ACS vs. Symptomatic Bradycardia     - Classic patient story for stable angina. ED staff reported rest CP with bradycardic rates. - Chest pain improved since admission, rated 1-2 /10. - 2013 LHC negative for CAD. 2013 Echo EF 55-60%. Myoview 2016- intermediate risk. (Dr. Burt Knack).  - EKG significant for RBBB, junctional rhythm, PVCs. No STE.  - Repeat EKG in AM. - Initial troponin negative. Continue to cycle troponin.  - JVD and b/l edema on exam, side effects of verapamil vs. volume overload due to HF 2/2 bradycardic rates. Continue to monitor HR and if CP worsens with brady rates.  - BNP ordered - pending results. - Heparin and ASA started per ACS protocol. Continue.  - Daily CBC, as on heparin.  - Daily BMP, monitor electrolytes, renal function. - Lipid panel ordered - results pending. - Hepatic panel ordered - results pending. - No statin ordered, pending results of above labs. - Continue PRN nitro ointment for CP - reported alleviation with ointment. - Discontinue Verapamil d/t edema and bradycardic rates. Discontinue Bystolic. - Increased Lisinopril  40mg  po daily. Continue HCTZ 25mg .  - Ordered D-dimer with h/o PE. If positive D-dimer, continue workup for PE with CT angio chest. If negative D-dimer, plan for coronary CT as classic story for angina pectoris with exertion.  - Catheterization unlikely at this time with 2016 and 2013 LHC negative as above. - Admit for observation of patient and pending results of D-dimer with plan outline as above.   2. Essential HTN - BP 102-152/59-99 in ED with HR 44-67.  - Stop Verapamil 120mg  po BID d/t LEE edema. - Stop Bystolic samples (on day 10 of 14). - Lisinopril increased as above to 40mg . Continue HCTZ 25mg .  - Recommend cessation of ibuprofen d/t elevated blood pressure and CP. - Strict I/O. - Daily weights. -  Continue to monitor vitals.   3. History of Left Breast Cancer and Uterine Cancer - In remission  Signed, Arvil Chaco, PA-C 12/19/2017, 3:55 PM Pager [phone]  I have seen and examined the patient along with Arvil Chaco, PA-C.  I have reviewed the chart, notes and new data.  I agree with PA's note.  Key new complaints: He has exertional angina and exertional dyspnea.  She does not have pleurisy.  She seems to describe a fairly consistent pattern of crescendo exertional angina pectoris over the last month or so.  The symptoms have been occurring with steadily lower levels of physical activity.  They have also been lasting longer once she stops to rest.  She felt better after topical nitroglycerin was applied in the emergency room. She reports that she had a pulmonary embolism in [place] many years ago he did with anticoagulants ("shots in my stomach", followed by a medication for which she had to had blood tests, probably warfarin). The hypertension medication that she was given samples was probably not benazepril (generic, no samples), but rather Bystolic 10 mg, which is also a pink triangular pill Key examination changes: Morbid obesity limits her exam.  She seems to have a distinct fourth  heart sound.  Minimal pedal edema.  I am not sure that she has jugular venous distention.  I find it very hard to see her jugular veins due to obesity.  Otherwise normal cardiovascular exam. Key new findings / data: BNP is minimally elevated.  Initial troponin is undetectable.  ECG shows sinus bradycardia (beats conducted with right bundle branch block pattern) with junctional escape rhythm with diffuse nonspecific ST segment depression in virtually all leads.  2013 showed normal left ventricular systolic function and regional wall motion mild LVH and no significant valvular abnormalities.  She has had 2 cardiac catheterizations in 2013 and 2016 without any CAD.  The last catheterization was  performed following a false positive nuclear perfusion test.  PLAN: Puzzling situation.  Her symptoms sound quite compatible with exertional angina pectoris, but a relatively recent coronary angiogram did not show any significant CAD. She has a remote history of pulmonary embolism and need to exclude recurrent PE as a cause of exertional symptoms.  We will check a d-dimer.  If this is abnormal she should have a chest CT angiogram.  In the meantime, continue intravenous heparin. Recheck her ECG when she returns to normal rhythm. Her bradyarrhythmia is related to simultaneous treatment with a beta-blocker and verapamil.  Bystolic will be stopped.  The bradycardia could be deficient to explain her exertional dyspnea, but her chest pain syndrome preceded the use of Bystolic. For the time being we will also hold the verapamil.  This is conceivably the cause of her leg edema, but she has been on this medication for about 15 years without side effects.  Increase the dose of lisinopril.  Sanda Klein, MD, [place] [phone] 12/19/2017, 5:19 PM

## 2017-12-19 NOTE — Progress Notes (Signed)
1700 Pt arrived via stretcher from ED. Pt A&Ox4, denies CP, SOB. Pt assisted to bathroom. Oriented to room, updated with POC. Pt assessed, see flow sheet. Pt understands without assistance. Fall precautions in place, [facility].   1830 Pt medicated per MAR. Sitting up in bed eating dinner. NAD, no complaints at this time.

## 2017-12-19 NOTE — Progress Notes (Signed)
ANTICOAGULATION CONSULT NOTE - Initial Consult  Pharmacy Consult for heparin Indication: chest pain/ACS  Allergies  Allergen Reactions  . Percocet [Oxycodone-Acetaminophen] Itching and Rash    Patient Measurements: Height: 5\' 5"  (165.1 cm) Weight: 275 lb (124.7 kg) IBW/kg (Calculated) : 57 Heparin Dosing Weight: 87kg  Vital Signs: Temp: 97.9 F (36.6 C) (08/15 1721) Temp Source: Oral (08/15 1721) BP: 133/81 (08/15 1721) Pulse Rate: 63 (08/15 1930)  Labs: Recent Labs    12/19/17 0822 12/19/17 1212 12/19/17 1951  HGB 12.4  --   --   HCT 39.6  --   --   PLT 235  --   --   LABPROT  --  13.8  --   INR  --  1.07  --   HEPARINUNFRC  --   --  0.57  CREATININE 0.93  --   --     Estimated Creatinine Clearance: 87.5 mL/min (by C-G formula based on SCr of 0.93 mg/dL).   Medical History: Past Medical History:  Diagnosis Date  . Arthritis    "left hip; hands" (07/28/2014)  . Breast cancer, left breast ([place]) 1999   a. s/p left mastectomy  . Dermatomyositis ([place])    a. noted prior to diagnosis of Breast CA ("i'm allergic to cancer")  . Family history of adverse reaction to anesthesia    "all my daughters get real sick and throw up alot"  . Headache    "maybe monthly" (07/28/2014)  . Heart murmur   . Hypertension   . Mitral valve prolapse   . Obesity   . Pneumonia    "haven't had it in the last 4 yrs; before that I had it q yr for 4-5 years" (07/28/2014)  . Pulmonary embolism ([place]) ~ 2003  . Unstable angina ([place])    a. Cath ~ 10 + yrs ago, [facility], [place] - "normal but pressures were high."  . Uterine cancer ([place]) 2007    Medications:  Scheduled:  . iopamidol      . [START ON 12/20/2017] aspirin EC  81 mg Oral Daily  . hydrochlorothiazide  25 mg Oral Daily  . lisinopril  40 mg Oral Daily  . [START ON 12/20/2017] multivitamin with minerals  1 tablet Oral Daily  . sodium chloride flush  3 mL Intravenous Q12H   Assessment: 64 YOF presenting with CP/SOB/fatigue, no  anticoagulation PTA, CBC wnl and no bleeding observed. Firs heparin level is therapeutic at 0.57.  Goal of Therapy:  Heparin level 0.3-0.7 units/ml Monitor platelets by anticoagulation protocol: Yes   Plan:  Continue heparin gtt at 1,200 units/hr Monitor daily heparin level, CBC, s/s of bleed  Elenor Quinones, PharmD, BCPS Clinical Pharmacist Phone number [phone] 12/19/2017 8:45 PM

## 2017-12-19 NOTE — ED Notes (Signed)
Attempted to call report

## 2017-12-19 NOTE — ED Notes (Signed)
Admitting md at bedsdie

## 2017-12-19 NOTE — ED Triage Notes (Signed)
Pt states after dropping her grandkids off, she was walking to her car when she started having chest pain, sob, leg weakness and fatigue. Drove to work and states she did not feel well, so she sat down and then realized she felt to bad to get up. Denies pain radiation, resp e/u,nad

## 2017-12-19 NOTE — Progress Notes (Signed)
ANTICOAGULATION CONSULT NOTE - Initial Consult  Pharmacy Consult for heparin Indication: chest pain/ACS  Allergies  Allergen Reactions  . Percocet [Oxycodone-Acetaminophen] Itching and Rash    Patient Measurements: Height: 5\' 5"  (165.1 cm) Weight: 275 lb (124.7 kg) IBW/kg (Calculated) : 57 Heparin Dosing Weight: 87kg  Vital Signs: Temp: 97.7 F (36.5 C) (08/15 0815) Temp Source: Oral (08/15 0815) BP: 127/80 (08/15 1100) Pulse Rate: 48 (08/15 1100)  Labs: Recent Labs    12/19/17 0822  HGB 12.4  HCT 39.6  PLT 235  CREATININE 0.93    Estimated Creatinine Clearance: 87.5 mL/min (by C-G formula based on SCr of 0.93 mg/dL).   Medical History: Past Medical History:  Diagnosis Date  . Arthritis    "left hip; hands" (07/28/2014)  . Breast cancer, left breast ([place]) 1999   a. s/p left mastectomy  . Dermatomyositis ([place])    a. noted prior to diagnosis of Breast CA ("i'm allergic to cancer")  . Family history of adverse reaction to anesthesia    "all my daughters get real sick and throw up alot"  . Headache    "maybe monthly" (07/28/2014)  . Heart murmur   . Hypertension   . Mitral valve prolapse   . Obesity   . Pneumonia    "haven't had it in the last 4 yrs; before that I had it q yr for 4-5 years" (07/28/2014)  . Pulmonary embolism ([place]) ~ 2003  . Unstable angina ([place])    a. Cath ~ 10 + yrs ago, [facility], [place] - "normal but pressures were high."  . Uterine cancer ([place]) 2007    Medications:  Scheduled:  . aspirin  324 mg Oral Once    Assessment: 71 YOF presenting with CP/SOB/fatigue, no anticoagulation PTA, CBC wnl and no bleeding observed.  Goal of Therapy:  Heparin level 0.3-0.7 units/ml Monitor platelets by anticoagulation protocol: Yes   Plan:  Heparin 4000 units IV x 1, and gtt at 1200 units/hr F/u 6 hour heparin level Daily CBC, heparin level, s/s bleeding  Bertis Ruddy, PharmD Clinical Pharmacist Please check AMION for all [place]  numbers 12/19/2017 11:41 AM

## 2017-12-19 NOTE — ED Notes (Signed)
Cardiology at bedsdie

## 2017-12-20 ENCOUNTER — Observation Stay (HOSPITAL_COMMUNITY): Payer: Managed Care, Other (non HMO)

## 2017-12-20 ENCOUNTER — Ambulatory Visit (HOSPITAL_BASED_OUTPATIENT_CLINIC_OR_DEPARTMENT_OTHER): Payer: Managed Care, Other (non HMO)

## 2017-12-20 DIAGNOSIS — I498 Other specified cardiac arrhythmias: Secondary | ICD-10-CM | POA: Diagnosis not present

## 2017-12-20 DIAGNOSIS — I10 Essential (primary) hypertension: Secondary | ICD-10-CM

## 2017-12-20 DIAGNOSIS — R079 Chest pain, unspecified: Secondary | ICD-10-CM | POA: Diagnosis not present

## 2017-12-20 DIAGNOSIS — I209 Angina pectoris, unspecified: Secondary | ICD-10-CM | POA: Diagnosis not present

## 2017-12-20 DIAGNOSIS — I200 Unstable angina: Secondary | ICD-10-CM | POA: Diagnosis not present

## 2017-12-20 DIAGNOSIS — R0789 Other chest pain: Secondary | ICD-10-CM | POA: Diagnosis not present

## 2017-12-20 LAB — BASIC METABOLIC PANEL
ANION GAP: 10 (ref 5–15)
BUN: 15 mg/dL (ref 6–20)
CO2: 26 mmol/L (ref 22–32)
Calcium: 8.6 mg/dL — ABNORMAL LOW (ref 8.9–10.3)
Chloride: 103 mmol/L (ref 98–111)
Creatinine, Ser: 0.73 mg/dL (ref 0.44–1.00)
GLUCOSE: 97 mg/dL (ref 70–99)
POTASSIUM: 3.7 mmol/L (ref 3.5–5.1)
Sodium: 139 mmol/L (ref 135–145)

## 2017-12-20 LAB — LIPID PANEL
CHOL/HDL RATIO: 3.5 ratio
CHOLESTEROL: 163 mg/dL (ref 0–200)
HDL: 47 mg/dL (ref >40)
LDL Cholesterol: 105 mg/dL — ABNORMAL HIGH (ref 0–99)
Triglycerides: 56 mg/dL (ref <150)
VLDL: 11 mg/dL (ref 0–40)

## 2017-12-20 LAB — CBC
HEMATOCRIT: 37.7 % (ref 36.0–46.0)
HEMOGLOBIN: 11.8 g/dL — AB (ref 12.0–15.0)
MCH: 28.9 pg (ref 26.0–34.0)
MCHC: 31.3 g/dL (ref 30.0–36.0)
MCV: 92.2 fL (ref 78.0–100.0)
PLATELETS: 186 10*3/uL (ref 150–400)
RBC: 4.09 MIL/uL (ref 3.87–5.11)
RDW: 13.6 % (ref 11.5–15.5)
WBC: 4.4 10*3/uL (ref 4.0–10.5)

## 2017-12-20 LAB — ECHOCARDIOGRAM COMPLETE
HEIGHTINCHES: 65 in
Weight: 4396.85 oz

## 2017-12-20 LAB — TROPONIN I: Troponin I: <0.03 ng/mL (ref <0.03)

## 2017-12-20 LAB — HIV ANTIBODY (ROUTINE TESTING W REFLEX): HIV Screen 4th Generation wRfx: NONREACTIVE

## 2017-12-20 LAB — HEPARIN LEVEL (UNFRACTIONATED): Heparin Unfractionated: 0.6 IU/mL (ref 0.30–0.70)

## 2017-12-20 MED ORDER — SODIUM CHLORIDE 0.9 % IV SOLN
INTRAVENOUS | Status: DC
  Administered 2017-12-20 – 2017-12-21 (×3): via INTRAVENOUS

## 2017-12-20 MED ORDER — IOPAMIDOL (ISOVUE-370) INJECTION 76%
100.0000 mL | Freq: Once | INTRAVENOUS | Status: AC | PRN
  Administered 2017-12-20: 100 mL via INTRAVENOUS

## 2017-12-20 MED ORDER — ACETAMINOPHEN 325 MG PO TABS: 650.0000 mg | ORAL_TABLET | Freq: Four times a day (QID) | ORAL | Status: DC | PRN

## 2017-12-20 MED ORDER — PERFLUTREN LIPID MICROSPHERE
1.0000 mL | INTRAVENOUS | Status: AC | PRN
  Administered 2017-12-20: 2 mL via INTRAVENOUS
  Filled 2017-12-20: qty 10

## 2017-12-20 NOTE — Progress Notes (Signed)
  Echocardiogram 2D Echocardiogram with definity has been performed.  Darlina Sicilian M 12/20/2017, 12:11 PM

## 2017-12-20 NOTE — Progress Notes (Signed)
Cardiologist on call notified of pt c/o of 2/10 lt sided chest pain/pressure radiating to lt flank/rib area. Pt also c/o of 7/10 h/a. Per pt she was sweating, her chest started hurting & did her head starting aching.vs charted will await new orders from MD. Hoover Brunette, RN

## 2017-12-20 NOTE — Plan of Care (Signed)
  Problem: Pain Managment: Goal: General experience of comfort will improve 12/20/2017 0656 by Hoover Brunette, RN Outcome: Not Progressing 12/20/2017 0654 by Hoover Brunette, RN Outcome: Not Progressing  Pt has been having intermittent chest pain throughout the night & she even had a h/a at one time. Refusing nitro d/t low bp.

## 2017-12-20 NOTE — Progress Notes (Signed)
[facility] for heparin Indication: chest pain/ACS  Allergies  Allergen Reactions  . Percocet [Oxycodone-Acetaminophen] Itching and Rash    Patient Measurements: Height: 5\' 5"  (165.1 cm) Weight: 274 lb 12.9 oz (124.6 kg) IBW/kg (Calculated) : 57 Heparin Dosing Weight: 87kg  Vital Signs: Temp: 97.9 F (36.6 C) (08/16 0500) Temp Source: Oral (08/16 0500) BP: 98/51 (08/16 0653) Pulse Rate: 65 (08/16 0653)  Labs: Recent Labs    12/19/17 0822 12/19/17 1212 12/19/17 1951 12/19/17 2345 12/20/17 0550 12/20/17 0721  HGB 12.4  --   --   --  11.8*  --   HCT 39.6  --   --   --  37.7  --   PLT 235  --   --   --  186  --   LABPROT  --  13.8  --   --   --   --   INR  --  1.07  --   --   --   --   HEPARINUNFRC  --   --  0.57  --   --  0.60  CREATININE 0.93  --   --   --  0.73  --   TROPONINI  --   --  <0.03 <0.03 <0.03  --     Estimated Creatinine Clearance: 101.8 mL/min (by C-G formula based on SCr of 0.73 mg/dL).   Medical History: Past Medical History:  Diagnosis Date  . Arthritis    "left hip; hands" (07/28/2014)  . Breast cancer, left breast ([place]) 1999   a. s/p left mastectomy  . Dermatomyositis ([place])    a. noted prior to diagnosis of Breast CA ("i'm allergic to cancer")  . Family history of adverse reaction to anesthesia    "all my daughters get real sick and throw up alot"  . Headache    "maybe monthly" (07/28/2014)  . Heart murmur   . Hypertension   . Mitral valve prolapse   . Obesity   . Pneumonia    "haven't had it in the last 4 yrs; before that I had it q yr for 4-5 years" (07/28/2014)  . Pulmonary embolism ([place]) ~ 2003  . Unstable angina ([place])    a. Cath ~ 10 + yrs ago, [facility], [place] - "normal but pressures were high."  . Uterine cancer ([place]) 2007    Medications:  Scheduled:  . aspirin EC  81 mg Oral Daily  . hydrochlorothiazide  25 mg Oral Daily  . lisinopril  40 mg Oral Daily  . multivitamin with minerals  1  tablet Oral Daily  . sodium chloride flush  3 mL Intravenous Q12H   Assessment: 57 YOF presenting with CP/SOB/fatigue, no anticoagulation PTA.  Confirmatory heparin level is therapeutic on 0.6, on 1200 units/hr. Hgb 11.8, plt 186. Trop <0.03. No s/sx of bleeding, no infusion issues per nursing.  Goal of Therapy:  Heparin level 0.3-0.7 units/ml Monitor platelets by anticoagulation protocol: Yes   Plan:  Continue heparin gtt at 1,200 units/hr Monitor daily heparin level, CBC, s/s of bleed F/u cardiology workup and duration of heparin infusion  Doylene Canard, PharmD Clinical Pharmacist  Pager: [phone] Phone: [phone] 12/20/2017 9:07 AM

## 2017-12-20 NOTE — Progress Notes (Signed)
   12/20/17 1100  Clinical Encounter Type  Visited With Patient;Patient and family together  Visit Type Initial  Referral From Nurse  Consult/Referral To Chaplain  Spiritual Encounters  Spiritual Needs Brochure;Emotional  Stress Factors  Patient Stress Factors Exhausted    Pt was sitting in a chair watching something on a tablet. Family members on-site. Chaplain had a short conversation with pt, handed pt the POA papers and explained about them. Pt also requested for prayer and chaplain helped lead the prayer. Pt will call back through nurse as soon as she is ready. Pt and family were very thankful for chaplain's visit.  Alaze Garverick a Medical sales representative, [employer]

## 2017-12-20 NOTE — Plan of Care (Signed)
Pt compliant with use of call light to request assistance, educated about cardiac Ct, verbalized understanding, advanced directive being completed by patient, call light and phone within reach, pts daughter and sister at bedside.  Edward Qualia RN

## 2017-12-20 NOTE — Plan of Care (Deleted)
Pt has been having intermittent chest pain throughout the night & she even had a h/a at one time. Refusing nitro d/t low bp.

## 2017-12-20 NOTE — Progress Notes (Signed)
Progress Note  Patient Name: Gail Schmitt Date of Encounter: 12/20/2017  Primary Cardiologist: No primary care provider on file.   Subjective   No chest pain at rest.  Denies dyspnea.  Remains anxious.  Relatively low blood pressure overnight, mild sinus bradycardia but no further episodes of junctional rhythm.  Inpatient Medications    Scheduled Meds: . aspirin EC  81 mg Oral Daily  . hydrochlorothiazide  25 mg Oral Daily  . lisinopril  40 mg Oral Daily  . multivitamin with minerals  1 tablet Oral Daily  . sodium chloride flush  3 mL Intravenous Q12H   Continuous Infusions: . sodium chloride    . sodium chloride    . heparin 1,200 Units/hr (12/20/17 0644)   PRN Meds: sodium chloride, acetaminophen, nitroGLYCERIN, ondansetron (ZOFRAN) IV, sodium chloride flush   Vital Signs    Vitals:   12/20/17 0031 12/20/17 0500 12/20/17 0653 12/20/17 0940  BP: 96/73 108/67 (!) 98/51 (!) 123/53  Pulse: 65 (!) 58 65 66  Resp:  14    Temp:  97.9 F (36.6 C)  98 F (36.7 C)  TempSrc:  Oral  Oral  SpO2: 98% 100% 99% 100%  Weight:  124.6 kg    Height:        Intake/Output Summary (Last 24 hours) at 12/20/2017 1042 Last data filed at 12/20/2017 0817 Gross per 24 hour  Intake 820.78 ml  Output 2150 ml  Net -1329.22 ml   Filed Weights   12/19/17 1100 12/20/17 0500  Weight: 124.7 kg 124.6 kg    Telemetry    Normal sinus rhythm/mild sinus bradycardia- Personally Reviewed  ECG    Sinus bradycardia with sinus arrhythmia, minimal diffuse nonspecific ST segment changes - personally Reviewed  Physical Exam  Obesity limits exam GEN: No acute distress.   Neck: No JVD Cardiac: RRR, no murmurs, rubs, or gallops.  Respiratory: Clear to auscultation bilaterally. GI: Soft, nontender, non-distended  MS: No edema; No deformity. Neuro:  Nonfocal  Psych: Normal affect   Labs    Chemistry Recent Labs  Lab 12/19/17 0822 12/19/17 1951 12/20/17 0550  NA 139  --  139  K 3.7   --  3.7  CL 105  --  103  CO2 24  --  26  GLUCOSE 117*  --  97  BUN 19  --  15  CREATININE 0.93  --  0.73  CALCIUM 9.2  --  8.6*  PROT  --  7.1  --   ALBUMIN  --  3.4*  --   AST  --  22  --   ALT  --  17  --   ALKPHOS  --  59  --   BILITOT  --  0.8  --   GFRNONAA >60  --  >60  GFRAA >60  --  >60  ANIONGAP 10  --  10     Hematology Recent Labs  Lab 12/19/17 0822 12/20/17 0550  WBC 4.2 4.4  RBC 4.38 4.09  HGB 12.4 11.8*  HCT 39.6 37.7  MCV 90.4 92.2  MCH 28.3 28.9  MCHC 31.3 31.3  RDW 13.3 13.6  PLT 235 186    Cardiac Enzymes Recent Labs  Lab 12/19/17 1951 12/19/17 2345 12/20/17 0550  TROPONINI <0.03 <0.03 <0.03    Recent Labs  Lab 12/19/17 0843  TROPIPOC 0.00     BNP Recent Labs  Lab 12/19/17 1520  BNP 159.8*     DDimer  Recent [employer]  12/19/17 1520  DDIMER 0.77*     Radiology    Dg Chest 2 View  Result Date: 12/19/2017 CLINICAL DATA:  Chest pain and shortness of breath EXAM: CHEST - 2 VIEW COMPARISON:  February 21, 2016 FINDINGS: There is no edema or consolidation. The heart size and pulmonary vascularity are normal. No adenopathy. The patient has had a previous mastectomy on the left. No evident bone lesions. IMPRESSION: No edema or consolidation.  Status post left-sided mastectomy. Electronically Signed   By: Lowella Grip III M.D.   On: 12/19/2017 09:04   Ct Angio Chest Pe W Or Wo Contrast  Result Date: 12/19/2017 CLINICAL DATA:  Shortness of breath and chest pain worse with exertion. EXAM: CT ANGIOGRAPHY CHEST WITH CONTRAST TECHNIQUE: Multidetector CT imaging of the chest was performed using the standard protocol during bolus administration of intravenous contrast. Multiplanar CT image reconstructions and MIPs were obtained to evaluate the vascular anatomy. CONTRAST:  160mL ISOVUE-370 IOPAMIDOL (ISOVUE-370) INJECTION 76% COMPARISON:  None. FINDINGS: Cardiovascular: Satisfactory opacification of the pulmonary arteries to the segmental  level. No evidence of pulmonary embolism. Normal heart size. No pericardial effusion. Mediastinum/Nodes: No enlarged mediastinal, hilar, or axillary lymph nodes. Thyroid gland, trachea, and esophagus demonstrate no significant findings. Lungs/Pleura: Lungs are clear. No pleural effusion or pneumothorax. Upper Abdomen: No acute abnormality. Musculoskeletal: Post left mastectomy and saline implant reconstruction. No acute or significant osseous findings. Review of the MIP images confirms the above findings. IMPRESSION: No evidence of pulmonary embolus or other abnormality in the thorax. Electronically Signed   By: Fidela Salisbury M.D.   On: 12/19/2017 21:03    Cardiac Studies   Echo pending  Patient Profile     57 y.o.obese female presents with complaints of exertional angina and exertional dyspnea, mildly abnormal d-dimer but no pulmonary embolism by CT angiogram of the chest  Assessment & Plan    Echo pending.  After intravenous fluid hydration, plan a coronary protocol CT angiogram of the chest. If there is no evidence of significant coronary artery disease I think it is reasonable to treat empirically for coronary spasm, even though her symptoms seem to be exertional.  She is already on verapamil and can and long-acting nitrates, which have been associated with improvement of her symptoms already. Avoid beta-blockers.  For questions or updates, please contact [facility] Please consult [email or web address] for contact info under Cardiology/STEMI.      Signed, Sanda Klein, MD  12/20/2017, 10:42 AM

## 2017-12-21 DIAGNOSIS — I209 Angina pectoris, unspecified: Secondary | ICD-10-CM | POA: Diagnosis not present

## 2017-12-21 DIAGNOSIS — R072 Precordial pain: Secondary | ICD-10-CM | POA: Diagnosis not present

## 2017-12-21 LAB — CBC
HCT: 35 % — ABNORMAL LOW (ref 36.0–46.0)
Hemoglobin: 11 g/dL — ABNORMAL LOW (ref 12.0–15.0)
MCH: 28.7 pg (ref 26.0–34.0)
MCHC: 31.4 g/dL (ref 30.0–36.0)
MCV: 91.4 fL (ref 78.0–100.0)
PLATELETS: 188 10*3/uL (ref 150–400)
RBC: 3.83 MIL/uL — ABNORMAL LOW (ref 3.87–5.11)
RDW: 13.5 % (ref 11.5–15.5)
WBC: 4.2 10*3/uL (ref 4.0–10.5)

## 2017-12-21 LAB — HEPARIN LEVEL (UNFRACTIONATED): HEPARIN UNFRACTIONATED: 0.49 [IU]/mL (ref 0.30–0.70)

## 2017-12-21 MED ORDER — NITROGLYCERIN 0.4 MG SL SUBL: 0.4000 mg | SUBLINGUAL_TABLET | SUBLINGUAL | 3 refills | Status: AC | PRN

## 2017-12-21 MED ORDER — HYDROCHLOROTHIAZIDE 25 MG PO TABS: 25.0000 mg | ORAL_TABLET | Freq: Every day | ORAL | 6 refills | Status: DC

## 2017-12-21 MED ORDER — LISINOPRIL 40 MG PO TABS: 40.0000 mg | ORAL_TABLET | Freq: Every day | ORAL | 6 refills | Status: DC

## 2017-12-21 MED ORDER — NITROGLYCERIN 0.4 MG SL SUBL: 0.4000 mg | SUBLINGUAL_TABLET | SUBLINGUAL | 12 refills | Status: DC | PRN

## 2017-12-21 NOTE — Discharge Instructions (Signed)

## 2017-12-21 NOTE — Progress Notes (Signed)
Brief cardiology update:  CTA coronary with 0 for calcium score, no evidence of CAD. No pain currently, had brief episode overnight but was mild and did not require treatment.   The exertional nature of her pain is concerning, but the workup is reassuring. Her blood pressure is well controlled on her current regimen. Will plan for discharge today with outpatient follow up to monitor her symptoms and adjust her medications.  She is in agreement with the plan and call with any concerns. She is happy with the current medication regimen. As an outpatient, if she develops recurrent symptoms I agree with Dr. Victorino December note that she should be trialed on long acting nitrates and/or resumption of her calcium channel blocker. Agree with avoiding beta blockers.  Buford Dresser, MD, PhD [facility]  [address] [place], [place] [address] [phone]

## 2017-12-21 NOTE — Discharge Summary (Addendum)
Discharge Summary    Patient ID: Gail Schmitt,  MRN: [id number], DOB/AGE: Dec 14, 1960 57 y.o.  Admit date: 12/19/2017 Discharge date: 12/21/2017  Primary Care Provider: Shawnee Knapp Primary Cardiologist: Dr. [name]  Discharge Diagnoses    Principal Problem:   Angina pectoris [facility]) Active Problems:   HTN (hypertension)   Obesity   Fatty infiltration of liver   Allergies Allergies  Allergen Reactions  . Percocet [Oxycodone-Acetaminophen] Itching and Rash    Diagnostic Studies/Procedures    Coronary CT 12/21/17 IMPRESSION: 1. Coronary calcium score of 0. This was 0 percentile for age and sex matched control.  2. Normal coronary origin with right and left co-dominance.  3. Study quality is affected by patient's size, however there is no evidence of CAD.  4. Pulmonary artery is dilated measuring 34 mm suggestive of pulmonary hypertension.  _____________    CT angio 12/19/17 IMPRESSION: No evidence of pulmonary embolus or other abnormality in the thorax.  _____________  2D ECHO 12/20/17 Study Conclusions - Left ventricle: The cavity size was normal. Wall thickness was   increased in a pattern of mild LVH. The estimated ejection   fraction was 50%. Diffuse hypokinesis. Left ventricular diastolic   function parameters were normal. - Aortic valve: There was no stenosis. - Mitral valve: There was no significant regurgitation. - Right ventricle: The cavity size was normal. Systolic function   was normal. - Pulmonary arteries: No complete TR doppler jet so unable to   estimate PA systolic pressure. - Inferior vena cava: The vessel was normal in size. The   respirophasic diameter changes were in the normal range (>= 50%),   consistent with normal central venous pressure. Impressions: - Technically difficult study with poor acoustic windows. Normal LV   size with mild LV hypertrophy. EF 50%, diffuse hypokinesis.   Diastolic function appears normal. Normal  RV size and systolic   function. No significant valvular abnormalities.  History of Present Illness     57 yo female with past medical history significant for HTN, possible h/o ? PE ([place], [place] treated with Coumadin), h/o breast cancer (s/p L sided mastectomy), remote history of mitral valve prolapse, s/p cholecystectomy, chronic chest pain (with negative caths in past) who presented to [facility] on 12/19/17 with chest pain.   Patient with prior history of chest pain and underwent cardiac catheterization as below on 07/13/11 that proved negative for CAD. She was most recently seen by Dr. Burt Knack of [facility] 07/29/2014 after presenting to [facility] ED with c/o RUQ abdominal pain. She was found to have gallstones & underwent cholecystectomy. Her chest pain during this admission (07/2014) was described as left sided with radiation to left shoulder and similar to the chest pain that resulted in cardiac catheterization on 07/28/2014, also negative for CAD. The last catheterization was performed following a false positive nuclear perfusion test.  Per patient report, she visited her PCP ~10 days ago due to ankle edema and high blood pressure and received 14 days of trial / sample Bystolic to take in addition to her verapamil and lisinopril-HCTZ.  In addition, she reports recently starting an OTC medication / supplement for weight loss, called Keto (as seen on shark tank) with ingredients listed as beta-hydroxybutyrate acid. She reports this is her fourth day taking this supplement. She reports she has palpitations, racing heart rate, and insomnia with caffeine and is not certain if this supplement contains caffeine or interacts with her current medications.   On 12/19/2017, patient reports  a worsening non-radiating episode of chest pressure on exertion described as substernal and extending under her left breast and around her side to under her left scapula.  She reportedly drove to work despite her symptoms but  decided to come to the ED after her symptoms worsened while walking up a ramp at work and still did not alleviate with rest.   While in the ED, patient was noted to have symptomatic bradycardia and grabbed her chest in pain with brady rates. She was started on heparin and ASA. Vitals significant for HR 44-66, T 97.7, RR ?7-21, BP 104-152 /59-99 and 98-100% ORA. Labs significant for Na 139, K 3.7, glucose 117, Cr 0.93, Ca 9.2, WBC 4.2, Hgb 12.4, negative pregnancy test. EKG with RBBB and junctional rhythm with PVCs. CXR without signs of cardiopulmonary disease and s/p L sided mastectomy.    She was admitted for observation and further work up.   Hospital Course     Consultants: none   She had a mildly abnormal d-dimer but no pulmonary embolism by CT angiogram of the chest. Her Bystolic and Verapamil were stopped given symptomatic bradycardia in the ER. She was previously on Prinzide 20-25, but lisinopril increased to 40mg . She was continued on HCTZ 25mg  daily. 2D echo showed EF 50% with diffuse HK and mild LVH. Coronary CT showed a coronary score of 0 with no evidence of CAD and pulmonary artery dilation suggestive of pulm HTN. She will be discharged home today with outpatient follow up. Per Dr. [name], if she develops recurrent symptoms she should be trialed on long acting nitrates and/or resumption of her calcium channel blocker. Beta blockers should be avoided. I have also Rx'd SL NTG to take if needed.  _____________  Discharge Vitals Blood pressure (!) 146/94, pulse 67, temperature 98 F (36.7 C), temperature source Oral, resp. rate 18, height 5\' 5"  (1.651 m), weight 123.2 kg, SpO2 100 %.  Filed Weights   12/19/17 1100 12/20/17 0500 12/21/17 0519  Weight: 124.7 kg 124.6 kg 123.2 kg    Labs & Radiologic Studies    CBC Recent Labs    12/20/17 0550 12/21/17 0453  WBC 4.4 4.2  HGB 11.8* 11.0*  HCT 37.7 35.0*  MCV 92.2 91.4  PLT 186 161   Basic Metabolic Panel Recent Labs     12/19/17 0822 12/20/17 0550  NA 139 139  K 3.7 3.7  CL 105 103  CO2 24 26  GLUCOSE 117* 97  BUN 19 15  CREATININE 0.93 0.73  CALCIUM 9.2 8.6*   Liver Function Tests Recent Labs    12/19/17 1951  AST 22  ALT 17  ALKPHOS 59  BILITOT 0.8  PROT 7.1  ALBUMIN 3.4*   No results for input(s): LIPASE, AMYLASE in the last 72 hours. Cardiac Enzymes Recent Labs    12/19/17 1951 12/19/17 2345 12/20/17 0550  TROPONINI <0.03 <0.03 <0.03   BNP Invalid input(s): POCBNP D-Dimer Recent Labs    12/19/17 1520  DDIMER 0.77*   Hemoglobin A1C Recent Labs    12/19/17 1951  HGBA1C 5.9*   Fasting Lipid Panel Recent Labs    12/20/17 0550  CHOL 163  HDL 47  LDLCALC 105*  TRIG 56  CHOLHDL 3.5   Thyroid Function Tests Recent Labs    12/19/17 1951  TSH 1.206   _____________  Dg Chest 2 View  Result Date: 12/19/2017 CLINICAL DATA:  Chest pain and shortness of breath EXAM: CHEST - 2 VIEW COMPARISON:  February 21, 2016 FINDINGS:  There is no edema or consolidation. The heart size and pulmonary vascularity are normal. No adenopathy. The patient has had a previous mastectomy on the left. No evident bone lesions. IMPRESSION: No edema or consolidation.  Status post left-sided mastectomy. Electronically Signed   By: Lowella Grip III M.D.   On: 12/19/2017 09:04   Ct Angio Chest Pe W Or Wo Contrast  Result Date: 12/19/2017 CLINICAL DATA:  Shortness of breath and chest pain worse with exertion. EXAM: CT ANGIOGRAPHY CHEST WITH CONTRAST TECHNIQUE: Multidetector CT imaging of the chest was performed using the standard protocol during bolus administration of intravenous contrast. Multiplanar CT image reconstructions and MIPs were obtained to evaluate the vascular anatomy. CONTRAST:  171mL ISOVUE-370 IOPAMIDOL (ISOVUE-370) INJECTION 76% COMPARISON:  None. FINDINGS: Cardiovascular: Satisfactory opacification of the pulmonary arteries to the segmental level. No evidence of pulmonary embolism.  Normal heart size. No pericardial effusion. Mediastinum/Nodes: No enlarged mediastinal, hilar, or axillary lymph nodes. Thyroid gland, trachea, and esophagus demonstrate no significant findings. Lungs/Pleura: Lungs are clear. No pleural effusion or pneumothorax. Upper Abdomen: No acute abnormality. Musculoskeletal: Post left mastectomy and saline implant reconstruction. No acute or significant osseous findings. Review of the MIP images confirms the above findings. IMPRESSION: No evidence of pulmonary embolus or other abnormality in the thorax. Electronically Signed   By: Fidela Salisbury M.D.   On: 12/19/2017 21:03   Ct Coronary Morph W/cta Cor W/score W/ca W/cm &/or Wo/cm  Addendum Date: 12/21/2017   ADDENDUM REPORT: 12/21/2017 11:20 CLINICAL DATA:  57 year old female with atypical chest pain and family h/o premature CAD. EXAM: Cardiac/Coronary  CT TECHNIQUE: The patient was scanned on a [employer]. FINDINGS: A 120 kV prospective scan was triggered in the descending thoracic aorta at 111 HU's. Axial non-contrast 3 mm slices were carried out through the heart. The data set was analyzed on a dedicated work station and scored using the [place]. Gantry rotation speed was 250 msecs and collimation was .6 mm. No beta blockade and 0.8 mg of sl NTG was given. The 3D data set was reconstructed in 5% intervals of the 67-82 % of the R-R cycle. Diastolic phases were analyzed on a dedicated work station using MPR, MIP and VRT modes. The patient received 80 cc of contrast. Aorta:  Normal size.  No calcifications.  No dissection. Aortic Valve:  Trileaflet.  No calcifications. Coronary Arteries: Normal coronary origin. Right and left co-dominance. RCA is a medium size co-dominant artery that gives rise to PDA. There is no plaque. Left main is a large artery that gives rise to LAD, ramus intermedius and LCX arteries. Left main has no plaque. LAD is a large vessel that has no plaque. RI is a large artery that  has no plaque. LCX is a large co-dominant artery that gives rise to two OM branches and PLA, there is no plaque. Other findings: Normal pulmonary vein drainage into the left atrium. Normal let atrial appendage without a thrombus. IMPRESSION: 1. Coronary calcium score of 0. This was 0 percentile for age and sex matched control. 2. Normal coronary origin with right and left co-dominance. 3. Study quality is affected by patient's size, however there is no evidence of CAD. 4. Pulmonary artery is dilated measuring 34 mm suggestive of pulmonary hypertension. Electronically Signed   By: Ena Dawley   On: 12/21/2017 11:20   Result Date: 12/21/2017 EXAM: OVER-READ INTERPRETATION  CT CHEST The following report is an over-read performed by radiologist Dr. Markus Daft of [facility]  on 12/21/2017. This over-read does not include interpretation of cardiac or coronary anatomy or pathology. The coronary calcium score/coronary CTA interpretation by the cardiologist is attached. COMPARISON:  Chest CT 04/06/2010 FINDINGS: Vascular: Normal caliber of the visualized thoracic aorta without any significant atherosclerotic disease and no evidence for dissection. No large filling defects in the central pulmonary arteries. Heart size is normal without significant pericardial fluid. Mediastinum/Nodes: Visualized esophagus is unremarkable. There is no lymph node enlargement in the visualized mediastinum or hilar regions. No enlarged axillary lymph nodes. Lungs/Pleura: Visualized lungs are clear.  No pleural effusions. Upper Abdomen: Images of the upper abdomen are unremarkable. Musculoskeletal: Left breast implant.  No acute bone abnormality. IMPRESSION: Negative over-read examination. Electronically Signed: By: Markus Daft M.D. On: 12/21/2017 08:23   Disposition   Pt is being discharged home today in good condition.  Follow-up Plans & Appointments    Follow-up Information    Croitoru, Mihai, MD Follow up.     Specialty:  Cardiology Why:  The office will call you to arrange follow up.  Contact information: [address] [place] [address] [phone]            Discharge Medications   Allergies as of 12/21/2017      Reactions   Percocet [oxycodone-acetaminophen] Itching, Rash      Medication List    STOP taking these medications   7-KETO LEAN Caps   verapamil 120 MG tablet Commonly known as:  CALAN     TAKE these medications   calcium carbonate 750 MG chewable tablet Commonly known as:  TUMS EX Chew 2 tablets by mouth as needed for heartburn.   hydrochlorothiazide 25 MG tablet Commonly known as:  HYDRODIURIL Take 1 tablet (25 mg total) by mouth daily. Start taking on:  12/22/2017   ibuprofen 400 MG tablet Commonly known as:  ADVIL,MOTRIN Take 400 mg by mouth every 6 (six) hours as needed.   lisinopril 40 MG tablet Commonly known as:  PRINIVIL,ZESTRIL Take 1 tablet (40 mg total) by mouth daily. Start taking on:  12/22/2017   loperamide 2 MG capsule Commonly known as:  IMODIUM Take 4 mg by mouth as needed for diarrhea or loose stools.   multivitamin with minerals Tabs tablet Take 1 tablet by mouth daily.   nitroGLYCERIN 0.4 MG SL tablet Commonly known as:  NITROSTAT Place 1 tablet (0.4 mg total) under the tongue every 5 (five) minutes as needed for chest pain.            Outstanding Labs/Studies   BMET  Duration of Discharge Encounter   Greater than 30 minutes including physician time.  Signed, Angelena Form, PA-C 12/21/2017, 12:25 PM

## 2017-12-21 NOTE — Progress Notes (Signed)
[place] for heparin Indication: chest pain/ACS  Allergies  Allergen Reactions  . Percocet [Oxycodone-Acetaminophen] Itching and Rash    Patient Measurements: Height: 5\' 5"  (165.1 cm) Weight: 271 lb 8 oz (123.2 kg) IBW/kg (Calculated) : 57 Heparin Dosing Weight: 87kg  Vital Signs: Temp: 98 F (36.7 C) (08/17 1205) Temp Source: Oral (08/17 1205) BP: 146/94 (08/17 1205) Pulse Rate: 67 (08/17 1205)  Labs: Recent Labs    12/19/17 0623 12/19/17 1212 12/19/17 1951 12/19/17 2345 12/20/17 0550 12/20/17 0721 12/21/17 0453  HGB 12.4  --   --   --  11.8*  --  11.0*  HCT 39.6  --   --   --  37.7  --  35.0*  PLT 235  --   --   --  186  --  188  LABPROT  --  13.8  --   --   --   --   --   INR  --  1.07  --   --   --   --   --   HEPARINUNFRC  --   --  0.57  --   --  0.60 0.49  CREATININE 0.93  --   --   --  0.73  --   --   TROPONINI  --   --  <0.03 <0.03 <0.03  --   --     Estimated Creatinine Clearance: 101 mL/min (by C-G formula based on SCr of 0.73 mg/dL).   Medical History: Past Medical History:  Diagnosis Date  . Arthritis    "left hip; hands" (07/28/2014)  . Breast cancer, left breast ([place]) 1999   a. s/p left mastectomy  . Dermatomyositis ([place])    a. noted prior to diagnosis of Breast CA ("i'm allergic to cancer")  . Family history of adverse reaction to anesthesia    "all my daughters get real sick and throw up alot"  . Headache    "maybe monthly" (07/28/2014)  . Heart murmur   . Hypertension   . Mitral valve prolapse   . Obesity   . Pneumonia    "haven't had it in the last 4 yrs; before that I had it q yr for 4-5 years" (07/28/2014)  . Pulmonary embolism ([place]) ~ 2003  . Unstable angina ([place])    a. Cath ~ 10 + yrs ago, [facility], [place] - "normal but pressures were high."  . Uterine cancer ([place]) 2007    Medications:  Scheduled:  . aspirin EC  81 mg Oral Daily  . hydrochlorothiazide  25 mg Oral Daily  . lisinopril  40  mg Oral Daily  . multivitamin with minerals  1 tablet Oral Daily  . sodium chloride flush  3 mL Intravenous Q12H   Assessment: 55 YOF presenting with CP/SOB/fatigue, no anticoagulation PTA.  Heparin level this morning remains therapeutic at 0.49, on 1200 units/hr. Hgb at 11, plt stable at 188. No s/sx of bleeding. No infusion issues.   Given negative troponin and coronary CT showing no CAD, cardiology discontinuing heparin. Plan for discharge today.  Doylene Canard, PharmD Clinical Pharmacist  Pager: [phone] Phone: [phone] 12/21/2017 12:56 PM

## 2018-01-08 ENCOUNTER — Encounter: Payer: Self-pay | Admitting: Cardiology

## 2018-01-09 ENCOUNTER — Ambulatory Visit: Payer: Managed Care, Other (non HMO) | Admitting: Cardiology

## 2018-01-09 ENCOUNTER — Encounter: Payer: Self-pay | Admitting: Cardiology

## 2018-01-09 DIAGNOSIS — Z86711 Personal history of pulmonary embolism: Secondary | ICD-10-CM

## 2018-01-09 DIAGNOSIS — I10 Essential (primary) hypertension: Secondary | ICD-10-CM

## 2018-01-09 DIAGNOSIS — Z5309 Procedure and treatment not carried out because of other contraindication: Secondary | ICD-10-CM | POA: Diagnosis not present

## 2018-01-09 DIAGNOSIS — R079 Chest pain, unspecified: Secondary | ICD-10-CM

## 2018-01-09 DIAGNOSIS — IMO0001 Reserved for inherently not codable concepts without codable children: Secondary | ICD-10-CM

## 2018-01-09 DIAGNOSIS — Z0389 Encounter for observation for other suspected diseases and conditions ruled out: Secondary | ICD-10-CM | POA: Diagnosis not present

## 2018-01-09 DIAGNOSIS — M3390 Dermatopolymyositis, unspecified, organ involvement unspecified: Secondary | ICD-10-CM | POA: Insufficient documentation

## 2018-01-09 DIAGNOSIS — R001 Bradycardia, unspecified: Secondary | ICD-10-CM | POA: Insufficient documentation

## 2018-01-09 DIAGNOSIS — Z853 Personal history of malignant neoplasm of breast: Secondary | ICD-10-CM

## 2018-01-09 MED ORDER — AMLODIPINE BESYLATE 5 MG PO TABS: 5.0000 mg | ORAL_TABLET | Freq: Every day | ORAL | 3 refills | Status: DC

## 2018-01-09 MED ORDER — AMLODIPINE BESYLATE 5 MG PO TABS: 5.0000 mg | ORAL_TABLET | Freq: Every day | ORAL | 2 refills | Status: DC

## 2018-01-09 NOTE — Assessment & Plan Note (Signed)
Admitted Aug 2019- CTA negative for PE, coronary Ca++ score 0 Troponin negative

## 2018-01-09 NOTE — Assessment & Plan Note (Signed)
Mild LVH and global HK on echo 12/20/17- EF 50% B/P 144/92 by me- add Norvasc

## 2018-01-09 NOTE — Assessment & Plan Note (Signed)
Normal coronaries 2013 and 2016 (after an abnormal Myoview)

## 2018-01-09 NOTE — Assessment & Plan Note (Signed)
This would explain a lot of her symptoms. I suggested she contact Dr Ayesha Rumpf for further work up and referral as indicated

## 2018-01-09 NOTE — Patient Instructions (Signed)
Medication Instructions:  START Norvasc 5mg  Take 1 tablet once a day   If you need a refill on your cardiac medications before your next appointment, please call your pharmacy.  Labwork: None  [employer] in our office  Testing/Procedures: None   Follow-Up: Your physician recommends that you schedule a follow-up appointment in: 3 months with Dr [employer] ONLY.  Any Other Special Instructions Will Be Listed Below (If Applicable).

## 2018-01-09 NOTE — Progress Notes (Signed)
01/09/2018 Gail Schmitt   1960-05-27  [id number]  Primary Physician [name], MD Primary Cardiologist: Dr [name]  HPI:  57 yo obese, AA female with past medical history significant for HTN,possibleh/o?PE([place],2003[place] with Coumadin), h/o breast cancer (s/p L sided mastectomy),remote history ofmitral valve prolapse, s/p cholecystectomy,and chronic chest pain (with negative caths in past-last 2016 after an abnormal Myoview) who presented to [facility] on [facility]17 with chest pain. She ruled out for an MI by Troponin. Her D-dimer was slightly elevated. CTA was negative for PE. Coronary CT showed Ca++ score of 0. She did have transient symptomatic bradycardia and her beta blocker and Ca++ blocker (Verapamil) were stopped. Echo showed global HK with an EF of 50% and mild LVH. She was discharged on increased Lisinopril and is in the office today for follow up.   The pt says she feels no better since discharge. Her main complaints are weakness and generalized discomfort. She tells me she feels like she is "walking in concrete". She also told me she thinks her symptoms are similar to an issue she had in the past just before she was diagnosed with breast cancer in 1999. At that time she was being worked for chest pain and was diagnosed with dermatomyositis. She was then diagnosed with breast cancer. Her symptoms were similar, weakness, generalized discomfort, and chest pain. She had a facial rash then, but she does not have any evidence of a rash now. She saw a rheumatologist for a period until her retired. She does not currently have a rheumatologist or oncologist.    Current Outpatient Medications  Medication Sig Dispense Refill  . calcium carbonate (TUMS EX) 750 MG chewable tablet Chew 2 tablets by mouth as needed for heartburn.    . hydrochlorothiazide (HYDRODIURIL) 25 MG tablet Take 1 tablet (25 mg total) by mouth daily. 30 tablet 6  . ibuprofen (ADVIL,MOTRIN) 400 MG tablet Take 400 mg by  mouth every 6 (six) hours as needed.    Marland Kitchen lisinopril (PRINIVIL,ZESTRIL) 40 MG tablet Take 1 tablet (40 mg total) by mouth daily. 30 tablet 6  . loperamide (IMODIUM) 2 MG capsule Take 4 mg by mouth as needed for diarrhea or loose stools.    . Multiple Vitamin (MULTIVITAMIN WITH MINERALS) TABS tablet Take 1 tablet by mouth daily.    . nitroGLYCERIN (NITROSTAT) 0.4 MG SL tablet Place 1 tablet (0.4 mg total) under the tongue every 5 (five) minutes as needed for chest pain. 25 tablet 3  . amLODipine (NORVASC) 5 MG tablet Take 1 tablet (5 mg total) by mouth daily. 90 tablet 2   No current facility-administered medications for this visit.     Allergies  Allergen Reactions  . Percocet [Oxycodone-Acetaminophen] Itching and Rash    Past Medical History:  Diagnosis Date  . Arthritis    "left hip; hands" (07/28/2014)  . Breast cancer, left breast ([place]) 1999   a. s/p left mastectomy  . Dermatomyositis ([place])    a. noted prior to diagnosis of Breast CA ("i'm allergic to cancer")  . 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 Family history of adverse reaction to anesthesia    "all my daughters get real sick and throw up alot"  . Headache    "maybe monthly" (07/28/2014)  . Heart murmur   . Hypertension   . Mitral valve prolapse   . Normal coronary arteries    last cath 2016  . Obesity   . Pneumonia    "haven't had it in  the last 4 yrs; before that I had it q yr for 4-5 years" (07/28/2014)  . Pulmonary embolism ([place]) ~ 2003  . RBBB    with symptomatic bradycardia  . Unstable angina ([place])    a. Cath ~ 10 + yrs ago, [facility], [place] - "normal but pressures were high."  . Uterine cancer ([place]) 2007    Social History   Socioeconomic History  . Marital status: Divorced    Spouse name: Not on file  . Number of children: Not on file  . Years of education: Not on file  . Highest education level: Not on file  Occupational History  . Not on file  Social Needs  . Financial  resource strain: Not on file  . Food insecurity:    Worry: Not on file    Inability: Not on file  . Transportation needs:    Medical: Not on file    Non-medical: Not on file  Tobacco Use  . Smoking status: Never Smoker  . Smokeless tobacco: Never Used  Substance and Sexual Activity  . Alcohol use: Yes    Alcohol/week: 1.0 standard drinks    Types: 1 Standard drinks or equivalent per week    Comment: 07/28/2014 "might have 1 drink a couple times/month"  . Drug use: No  . Sexual activity: Not Currently  Lifestyle  . Physical activity:    Days per week: Not on file    Minutes per session: Not on file  . Stress: Not on file  Relationships  . Social connections:    Talks on phone: Not on file    Gets together: Not on file    Attends religious service: Not on file    Active member of club or organization: Not on file    Attends meetings of clubs or organizations: Not on file    Relationship status: Not on file  . Intimate partner violence:    Fear of current or ex partner: Not on file    Emotionally abused: Not on file    Physically abused: Not on file    Forced sexual activity: Not on file  Other Topics Concern  . Not on file  Social History Narrative   Lives @ home in [place] with 1 of her 3 dtrs.  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  Doesn't follow any particular diet.     Family History  Problem Relation Age of Onset  . Diabetes type II Mother        alive @ 63  . Colon polyps Mother   . Diabetes Sister   . Stroke Brother   . Colon cancer Neg Hx      Review of Systems: General: negative for chills, fever, night sweats or weight changes.  Cardiovascular: negative for chest pain, dyspnea on exertion, edema, orthopnea, palpitations, paroxysmal nocturnal dyspnea or shortness of breath Dermatological: negative for rash Respiratory: negative for cough or wheezing Urologic: negative for hematuria Abdominal:  negative for nausea, vomiting, diarrhea, bright red blood per rectum, melena, or hematemesis Neurologic: negative for visual changes, syncope, or dizziness All other systems reviewed and are otherwise negative except as noted above.    Blood pressure (!) 140/92, pulse 72, height 5' 5.5" (1.664 m), weight 277 lb (125.6 kg).  General appearance: alert, cooperative, no distress and moderately obese Neck: no carotid bruit and no JVD Lungs: clear to auscultation bilaterally Heart: regular rate and rhythm Extremities: no edema  Skin: no obvious rash  Neurologic: Grossly normal  EKG NSR-HR 72  ASSESSMENT AND PLAN:   Chest pain Admitted Aug 2019- CTA negative for PE, coronary Ca++ score 0 Troponin negative  Essential hypertension Mild LVH and global HK on echo 12/20/17- EF 50% B/P 144/92 by me- add Norvasc  Dermatomyositis ([place]) This would explain a lot of her symptoms. I suggested she contact Dr Ayesha Rumpf for further work up and referral as indicated  History of breast cancer S/P Lt mastectomy and chemotherapy [place]  Normal coronary arteries Normal coronaries 2013 and 2016 (after an abnormal Myoview)  Beta-blockers contraindicated due to bradycardia RBBB and symptomatic sinus bradycardia, transient junctional rhythm- beta blocker and verapamil stopped   PLAN  Add Amlodipine 5 mg for HTN. F/U with Dr Ayesha Rumpf. Dr [name] will see once in 3 months.   Kerin Ransom PA-C 01/09/2018 8:41 AM

## 2018-01-09 NOTE — Assessment & Plan Note (Signed)
S/P Lt mastectomy and chemotherapy [place]

## 2018-01-09 NOTE — Assessment & Plan Note (Signed)
RBBB and symptomatic sinus bradycardia, transient junctional rhythm- beta blocker and verapamil stopped

## 2018-01-10 NOTE — Progress Notes (Signed)
Agree, thank you MCr 

## 2018-01-22 ENCOUNTER — Other Ambulatory Visit: Payer: Self-pay | Admitting: Family Medicine

## 2018-01-22 DIAGNOSIS — Z1231 Encounter for screening mammogram for malignant neoplasm of breast: Secondary | ICD-10-CM

## 2018-01-27 ENCOUNTER — Other Ambulatory Visit: Payer: Self-pay | Admitting: Family Medicine

## 2018-01-27 DIAGNOSIS — N644 Mastodynia: Secondary | ICD-10-CM

## 2018-01-27 DIAGNOSIS — R5381 Other malaise: Secondary | ICD-10-CM

## 2018-01-29 ENCOUNTER — Ambulatory Visit: Admission: RE | Admit: 2018-01-29 | Payer: Managed Care, Other (non HMO) | Source: Ambulatory Visit

## 2018-01-29 ENCOUNTER — Ambulatory Visit
Admission: RE | Admit: 2018-01-29 | Discharge: 2018-01-29 | Disposition: A | Discharge status: Home / Self Care | Payer: Managed Care, Other (non HMO) | Source: Ambulatory Visit | Attending: Family Medicine | Admitting: Family Medicine

## 2018-01-29 DIAGNOSIS — N644 Mastodynia: Secondary | ICD-10-CM

## 2018-03-03 ENCOUNTER — Encounter: Payer: Self-pay | Admitting: *Deleted

## 2018-03-11 ENCOUNTER — Encounter

## 2018-03-11 ENCOUNTER — Ambulatory Visit: Payer: Managed Care, Other (non HMO) | Admitting: Internal Medicine

## 2018-03-11 ENCOUNTER — Encounter: Payer: Self-pay | Admitting: Internal Medicine

## 2018-03-11 VITALS — BP 130/70 | HR 82 | Ht 65.5 in | Wt 281.0 lb

## 2018-03-11 DIAGNOSIS — R110 Nausea: Secondary | ICD-10-CM | POA: Diagnosis not present

## 2018-03-11 DIAGNOSIS — M3390 Dermatopolymyositis, unspecified, organ involvement unspecified: Secondary | ICD-10-CM

## 2018-03-11 DIAGNOSIS — K219 Gastro-esophageal reflux disease without esophagitis: Secondary | ICD-10-CM

## 2018-03-11 DIAGNOSIS — Z86010 Personal history of colonic polyps: Secondary | ICD-10-CM

## 2018-03-11 MED ORDER — SUPREP BOWEL PREP KIT 17.5-3.13-1.6 GM/177ML PO SOLN: 1.0000 | ORAL | 0 refills | Status: DC

## 2018-03-11 NOTE — Progress Notes (Signed)
Patient ID: Gail Schmitt, female   DOB: 06-25-1960, 57 y.o.   MRN: [id number] HPI: Gail Schmitt is a 57 year old female with a past medical history of adenomatous colon polyps, dermatomyositis diagnosed about 20 years ago when she was later diagnosed with breast cancer status post treatment, history of uterine cancer, hypertension who is seen in consultation at the request of Dr. Ayesha Rumpf and Woody Seller for surveillance colonoscopy but also to evaluate for malignancy in the setting of recurrent dermatomyositis.  She is here alone today.  She reports that this year her dermatomyositis returned in the form of muscle weakness, fatigue.  This led to a hospitalization in August and she has followed up with rheumatology.  She is also since been seen by Dr. Marylou Mccoy with oncology in the [facility] system.  From a GI perspective she has been having issues with heartburn but not dysphagia.  She is been having loose stools and at times urgent diarrhea.  This is worse with fatty and spicy foods.  She lives in fear of urgent diarrhea as this has led to prior accidents.  She is using loperamide may be 2 days/week.  No blood in her stool or melena.  She has noticed some increased GI gas and also occasional nausea.  No vomiting.  Occasionally she will feel an upper bandlike discomfort across her abdomen.  She was started on methotrexate in September and has been on a prednisone taper starting at 40 mg since her dermatomyositis return.  She is now tapered down to 10 mg daily and should start 5 mg within the next week.  Her last colonoscopy was performed by me on 01/03/2015.  This revealed 7 polyps ranging from 2 to 6 mm in size.  These were found to be tubular adenoma, hyperplastic polyp and lymphoid polyp.  No high-grade dysplasia or malignancy.  Her mother had prior colon polyps but there is no family history of colorectal cancer.  Her mother did have cholangiocarcinoma.  Past Medical History:  Diagnosis Date  . Arthritis     "left hip; hands" (07/28/2014)  . Breast cancer, left breast ([place]) 1999   a. s/p left mastectomy  . Dermatomyositis ([place])    a. noted prior to diagnosis of Breast CA ("i'm allergic to cancer")  . 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 Family history of adverse reaction to anesthesia    "all my daughters get real sick and throw up alot"  . Fatty liver   . Headache    "maybe monthly" (07/28/2014)  . Heart murmur   . Hypertension   . Mitral valve prolapse   . Normal coronary arteries    last cath 2016  . Obesity   . Pneumonia    "haven't had it in the last 4 yrs; before that I had it q yr for 4-5 years" (07/28/2014)  . Pulmonary embolism ([place]) ~ 2003  . RBBB    with symptomatic bradycardia  . Tubular adenoma of colon   . Unstable angina ([place])    a. Cath ~ 10 + yrs ago, [facility], [place] - "normal but pressures were high."  . Uterine cancer ([place]) 2007    Past Surgical History:  Procedure Laterality Date  . BREAST BIOPSY Left 1999  . CARDIAC CATHETERIZATION  07/13/2011  . CHOLECYSTECTOMY N/A 07/31/2014   Procedure: LAPAROSCOPIC CHOLECYSTECTOMY WITH INTRAOPERATIVE CHOLANGIOGRAM;  Surgeon: Erroll Luna, MD;  Location: [facility];  Service: General;  Laterality: N/A;  . INGUINAL HERNIA REPAIR Left 1986  . LAPAROSCOPIC  TOTAL HYSTERECTOMY    . LEFT HEART CATHETERIZATION WITH CORONARY ANGIOGRAM N/A 07/13/2011   Procedure: LEFT HEART CATHETERIZATION WITH CORONARY ANGIOGRAM;  Surgeon: Josue Hector, MD;  [facility] C[facility] LAB;  Service: Cardiovascular;  Laterality: N/A;  . LEFT HEART CATHETERIZATION WITH CORONARY ANGIOGRAM N/A 07/30/2014   Procedure: LEFT HEART CATHETERIZATION WITH CORONARY ANGIOGRAM;  Surgeon: [name], MD;  Location: [facility] CATH LAB;  Service: Cardiovascular;  Laterality: N/A;  . MASTECTOMY Left 1999  . RECONSTRUCTION BREAST IMMEDIATE / DELAYED W/ TISSUE EXPANDER Left 2005  . TOTAL ABDOMINAL HYSTERECTOMY  2007  . TUBAL LIGATION  1990    Outpatient  Medications Prior to Visit  Medication Sig Dispense Refill  . amLODipine (NORVASC) 5 MG tablet Take 1 tablet (5 mg total) by mouth daily. 90 tablet 2  . calcium carbonate (TUMS EX) 750 MG chewable tablet Chew 2 tablets by mouth as needed for heartburn.    . folic acid (FOLVITE) 1 MG tablet Take 1 mg by mouth daily.    . hydrochlorothiazide (HYDRODIURIL) 25 MG tablet Take 1 tablet (25 mg total) by mouth daily. 30 tablet 6  . ibuprofen (ADVIL,MOTRIN) 400 MG tablet Take 400 mg by mouth every 6 (six) hours as needed.    Marland Kitchen lisinopril (PRINIVIL,ZESTRIL) 40 MG tablet Take 1 tablet (40 mg total) by mouth daily. 30 tablet 6  . methotrexate (RHEUMATREX) 2.5 MG tablet Take 3 tablets by mouth 2 (two) times a week. 3 tablets Friday night and 3 tablets Saturday AM  2  . Multiple Vitamin (MULTIVITAMIN WITH MINERALS) TABS tablet Take 1 tablet by mouth daily.    . nitroGLYCERIN (NITROSTAT) 0.4 MG SL tablet Place 1 tablet (0.4 mg total) under the tongue every 5 (five) minutes as needed for chest pain. 25 tablet 3  . predniSONE (DELTASONE) 10 MG tablet Take by mouth. Tapering down , currently on 10mg  daily    . loperamide (IMODIUM) 2 MG capsule Take 4 mg by mouth as needed for diarrhea or loose stools.     No facility-administered medications prior to visit.     Allergies  Allergen Reactions  . Percocet [Oxycodone-Acetaminophen] Itching and Rash    Family History  Problem Relation Age of Onset  . Diabetes type II Mother   . Colon polyps Mother   . Other Mother        cholangiocarcinoma  . Diabetes Sister   . Stroke Brother   . Colon cancer Neg Hx   . Breast cancer Neg Hx     Social History   Tobacco Use  . Smoking status: Never Smoker  . Smokeless tobacco: Never Used  Substance Use Topics  . Alcohol use: Not Currently    Alcohol/week: 1.0 standard drinks    Types: 1 Standard drinks or equivalent per week    Comment: 07/28/2014 "might have 1 drink a couple times/month"  . Drug use: No     ROS: As per history of present illness, otherwise negative  BP 130/70   Pulse 82   Ht 5' 5.5" (1.664 m)   Wt 281 lb (127.5 kg)   BMI 46.05 kg/m  Constitutional: Well-developed and well-nourished. No distress. HEENT: Normocephalic and atraumatic. Conjunctivae are normal.  No scleral icterus. Neck: Neck supple. Trachea midline. Cardiovascular: Normal rate, regular rhythm and intact distal pulses. No M/R/G Pulmonary/chest: Effort normal and breath sounds normal. No wheezing, rales or rhonchi. Abdominal: Soft, nontender, nondistended. Bowel sounds active throughout.  Extremities: no clubbing, cyanosis, or edema Neurological: Alert and  oriented to person place and time. Skin: Skin is warm and dry.  Psychiatric: Normal mood and affect. Behavior is normal.  RELEVANT LABS AND IMAGING: CBC    Component Value Date/Time   WBC 4.2 12/21/2017 0453   RBC 3.83 (L) 12/21/2017 0453   HGB 11.0 (L) 12/21/2017 0453   HCT 35.0 (L) 12/21/2017 0453   PLT 188 12/21/2017 0453   MCV 91.4 12/21/2017 0453   MCV 87.0 02/21/2016 1134   MCH 28.7 12/21/2017 0453   MCHC 31.4 12/21/2017 0453   RDW 13.5 12/21/2017 0453   LYMPHSABS 1.7 07/28/2014 0738   MONOABS 0.4 07/28/2014 0738   EOSABS 0.4 07/28/2014 0738   BASOSABS 0.0 07/28/2014 0738    CMP     Component Value Date/Time   NA 139 12/20/2017 0550   K 3.7 12/20/2017 0550   CL 103 12/20/2017 0550   CO2 26 12/20/2017 0550   GLUCOSE 97 12/20/2017 0550   BUN 15 12/20/2017 0550   CREATININE 0.73 12/20/2017 0550   CREATININE 0.71 02/21/2016 1119   CALCIUM 8.6 (L) 12/20/2017 0550   PROT 7.1 12/19/2017 1951   ALBUMIN 3.4 (L) 12/19/2017 1951   AST 22 12/19/2017 1951   ALT 17 12/19/2017 1951   ALKPHOS 59 12/19/2017 1951   BILITOT 0.8 12/19/2017 1951   GFRNONAA >60 12/20/2017 0550   GFRNONAA >89 07/04/2014 1042   GFRAA >60 12/20/2017 0550   GFRAA >89 07/04/2014 1042    ASSESSMENT/PLAN: 57 year old female with a past medical history of  adenomatous colon polyps, dermatomyositis diagnosed about 20 years ago when she was later diagnosed with breast cancer status post treatment, history of uterine cancer, hypertension who is seen in consultation at the request of Dr. Ayesha Rumpf and Woody Seller for surveillance colonoscopy but also to evaluate for malignancy in the setting of recurrent dermatomyositis.   1.  History of adenomatous colon polyps --she is due for surveillance colonoscopy at this time.  We discussed the risk, benefits and alternatives and she is agreeable and wishes to proceed.  See #2  2.  Recurrent dermatomyositis --concerning for an occult malignancy.  She had dermatomyositis around the time of her breast cancer diagnosis exactly 20 years ago and her dermatomyositis went into remission when her breast cancer was treated.  We are going to perform an upper endoscopy, see #3 and colonoscopy, see #1.  If negative consider cross-sectional imaging of the abdomen pelvis with CT scan with contrast.  She is following with rheumatology and oncology.  Dermatomyositis symptoms have been improving with methotrexate and prednisone.  3.  GERD --we are proceeding to upper endoscopy predominantly for her dermatomyositis and to rule out an upper GI malignancy but this will also evaluate reflux.  We discussed the risk, benefits and alternatives and she is agreeable and wishes to proceed.  We considered PPI versus H2 blocker but she prefers to proceed to procedure first.    QQ:[facility], [place], [place], [place] Dr. Woody Seller

## 2018-03-11 NOTE — Patient Instructions (Addendum)
You have been scheduled for an endoscopy and colonoscopy. Please follow the written instructions given to you at your visit today. Please pick up your prep supplies at the pharmacy within the next 1-3 days. If you use inhalers (even only as needed), please bring them with you on the day of your procedure. Your physician has requested that you go to www.startemmi.com and enter the access code given to you at your visit today. This web site gives a general overview about your procedure. However, you should still follow specific instructions given to you by our office regarding your preparation for the procedure.  CC:Dr S.[facility]  If you are age 63 or older, your body mass index should be between 23-30. Your Body mass index is 46.05 kg/m. If this is out of the aforementioned range listed, please consider follow up with your Primary Care Provider.  If you are age 42 or younger, your body mass index should be between 19-25. Your Body mass index is 46.05 kg/m. If this is out of the aformentioned range listed, please consider follow up with your Primary Care Provider.

## 2018-03-21 ENCOUNTER — Encounter: Payer: Self-pay | Admitting: Internal Medicine

## 2018-03-26 ENCOUNTER — Encounter: Payer: Self-pay | Admitting: Internal Medicine

## 2018-03-26 ENCOUNTER — Ambulatory Visit (AMBULATORY_SURGERY_CENTER): Payer: Managed Care, Other (non HMO) | Admitting: Internal Medicine

## 2018-03-26 VITALS — BP 132/78 | HR 95 | Temp 96.4°F | Resp 18 | Ht 65.5 in | Wt 281.0 lb

## 2018-03-26 DIAGNOSIS — K2970 Gastritis, unspecified, without bleeding: Secondary | ICD-10-CM | POA: Diagnosis not present

## 2018-03-26 DIAGNOSIS — Z860100 Personal history of colon polyps, unspecified: Secondary | ICD-10-CM

## 2018-03-26 DIAGNOSIS — K3189 Other diseases of stomach and duodenum: Secondary | ICD-10-CM | POA: Diagnosis not present

## 2018-03-26 DIAGNOSIS — R110 Nausea: Secondary | ICD-10-CM

## 2018-03-26 MED ORDER — SODIUM CHLORIDE 0.9 % IV SOLN: 500.0000 mL | Freq: Once | INTRAVENOUS | Status: DC

## 2018-03-26 NOTE — Progress Notes (Signed)
Called to room to assist during endoscopic procedure.  Patient ID and intended procedure confirmed with present staff. Received instructions for my participation in the procedure from the performing physician.  

## 2018-03-26 NOTE — Patient Instructions (Signed)
YOU HAD AN ENDOSCOPIC PROCEDURE TODAY AT [facility]:   Refer to the procedure report that was given to you for any specific questions about what was found during the examination.  If the procedure report does not answer your questions, please call your gastroenterologist to clarify.  If you requested that your care partner not be given the details of your procedure findings, then the procedure report has been included in a sealed envelope for you to review at your convenience later.  YOU SHOULD EXPECT: Some feelings of bloating in the abdomen. Passage of more gas than usual.  Walking can help get rid of the air that was put into your GI tract during the procedure and reduce the bloating. If you had a lower endoscopy (such as a colonoscopy or flexible sigmoidoscopy) you may notice spotting of blood in your stool or on the toilet paper. If you underwent a bowel prep for your procedure, you may not have a normal bowel movement for a few days.  Please Note:  You might notice some irritation and congestion in your nose or some drainage.  This is from the oxygen used during your procedure.  There is no need for concern and it should clear up in a day or so.  SYMPTOMS TO REPORT IMMEDIATELY:   Following lower endoscopy (colonoscopy or flexible sigmoidoscopy):  Excessive amounts of blood in the stool  Significant tenderness or worsening of abdominal pains  Swelling of the abdomen that is new, acute  Fever of 100F or higher   Following upper endoscopy (EGD)  Vomiting of blood or coffee ground material  New chest pain or pain under the shoulder blades  Painful or persistently difficult swallowing  New shortness of breath  Fever of 100F or higher  Black, tarry-looking stools  For urgent or emergent issues, a gastroenterologist can be reached at any hour by calling [phone].   DIET:  We do recommend a small meal at first, but then you may proceed to your regular diet.  Drink  plenty of fluids but you should avoid alcoholic beverages for 24 hours.  ACTIVITY:  You should plan to take it easy for the rest of today and you should NOT DRIVE or use heavy machinery until tomorrow (because of the sedation medicines used during the test).    FOLLOW UP: Our staff will call the number listed on your records the next business day following your procedure to check on you and address any questions or concerns that you may have regarding the information given to you following your procedure. If we do not reach you, we will leave a message.  However, if you are feeling well and you are not experiencing any problems, there is no need to return our call.  We will assume that you have returned to your regular daily activities without incident.  If any biopsies were taken you will be contacted by phone or by letter within the next 1-3 weeks.  Please call us at [phone] if you have not heard about the biopsies in 3 weeks.    SIGNATURES/CONFIDENTIALITY: You and/or your care partner have signed paperwork which will be entered into your electronic medical record.  These signatures attest to the fact that that the information above on your After Visit Summary has been reviewed and is understood.  Full responsibility of the confidentiality of this discharge information lies with you and/or your care-partner.  Gastritis information given Dr. Vena Rua office will be in touch to  set up capsule endoscopy

## 2018-03-26 NOTE — Op Note (Addendum)
[place] Patient Name: Gail Schmitt Procedure Date: 03/26/2018 3:02 PM MRN: [id number] Endoscopist: Jerene Bears , MD Age: 57 Referring MD:  Date of Birth: 1960-12-01 Gender: Female Account #: [id number] Procedure:                Upper GI endoscopy Indications:              Gastro-esophageal reflux disease, recurrent                            dermatomyositis rule out occult malignancy Medicines:                Monitored Anesthesia Care Procedure:                Pre-Anesthesia Assessment:                           - Prior to the procedure, a History and Physical                            was performed, and patient medications and                            allergies were reviewed. The patient's tolerance of                            previous anesthesia was also reviewed. The risks                            and benefits of the procedure and the sedation                            options and risks were discussed with the patient.                            All questions were answered, and informed consent                            was obtained. Prior Anticoagulants: The patient has                            taken no previous anticoagulant or antiplatelet                            agents. ASA Grade Assessment: III - A patient with                            severe systemic disease. After reviewing the risks                            and benefits, the patient was deemed in                            satisfactory condition to undergo the procedure.  After obtaining informed consent, the endoscope was                            passed under direct vision. Throughout the                            procedure, the patient's blood pressure, pulse, and                            oxygen saturations were monitored continuously. The                            Model GIF-[id number] [id number]) scope was introduced                            through the  mouth, and advanced to the third part                            of duodenum. The upper GI endoscopy was                            accomplished without difficulty. The patient                            tolerated the procedure well. Scope In: Scope Out: Findings:                 The examined esophagus was normal.                           Mild inflammation characterized by erythema was                            found in the gastric antrum. Biopsies were taken                            with a cold forceps for histology and Helicobacter                            pylori testing.                           The cardia and gastric fundus were normal on                            retroflexion.                           The examined duodenum was normal. Complications:            No immediate complications. Estimated Blood Loss:     Estimated blood loss was minimal. Impression:               - Normal esophagus.                           - Mild gastritis. Biopsied.                           -  Normal examined duodenum. Recommendation:           - Patient has a contact number available for                            emergencies. The signs and symptoms of potential                            delayed complications were discussed with the                            patient. Return to normal activities tomorrow.                            Written discharge instructions were provided to the                            patient.                           - Resume previous diet.                           - Continue present medications.                           - Await pathology results.                           - See the other procedure note for documentation of                            additional recommendations. Jerene Bears, MD 03/26/2018 3:31:18 PM This report has been signed electronically.

## 2018-03-26 NOTE — Progress Notes (Signed)
Report given to PACU, vss 

## 2018-03-26 NOTE — Op Note (Addendum)
[place] Patient Name: Gail Schmitt Procedure Date: 03/26/2018 3:02 PM MRN: [id number] Endoscopist: Jerene Bears , MD Age: 57 Referring MD:  Date of Birth: 1960-12-26 Gender: Female Account #: [id number] Procedure:                Colonoscopy Indications:              Surveillance: Personal history of adenomatous                            polyps on last colonoscopy 3 years ago, recurrent                            dermatomyositis rule out occult malignancy Medicines:                Monitored Anesthesia Care Procedure:                Pre-Anesthesia Assessment:                           - Prior to the procedure, a History and Physical                            was performed, and patient medications and                            allergies were reviewed. The patient's tolerance of                            previous anesthesia was also reviewed. The risks                            and benefits of the procedure and the sedation                            options and risks were discussed with the patient.                            All questions were answered, and informed consent                            was obtained. Prior Anticoagulants: The patient has                            taken no previous anticoagulant or antiplatelet                            agents. ASA Grade Assessment: III - A patient with                            severe systemic disease. After reviewing the risks                            and benefits, the patient was deemed in  satisfactory condition to undergo the procedure.                           After obtaining informed consent, the colonoscope                            was passed under direct vision. Throughout the                            procedure, the patient's blood pressure, pulse, and                            oxygen saturations were monitored continuously. The                            Model PCF-[id number]  [id number]) scope was introduced                            through the anus and advanced to the cecum,                            identified by appendiceal orifice and ileocecal                            valve. The colonoscopy was performed without                            difficulty. The patient tolerated the procedure                            well. The quality of the bowel preparation was                            good. The ileocecal valve, appendiceal orifice, and                            rectum were photographed. Scope In: 3:15:24 PM Scope Out: 3:25:57 PM Scope Withdrawal Time: 0 hours 8 minutes 5 seconds  Total Procedure Duration: 0 hours 10 minutes 33 seconds  Findings:                 The digital rectal exam was normal.                           The entire examined colon appeared normal on direct                            and retroflexion views. Complications:            No immediate complications. Estimated Blood Loss:     Estimated blood loss: none. Impression:               - The entire examined colon is normal on direct and                            retroflexion  views.                           - No specimens collected. Recommendation:           - Patient has a contact number available for                            emergencies. The signs and symptoms of potential                            delayed complications were discussed with the                            patient. Return to normal activities tomorrow.                            Written discharge instructions were provided to the                            patient.                           - Resume previous diet.                           - Continue present medications.                           - Patient had a recent CT scan with contrast of the                            chest abdomen pelvis, unremarkable for occult                            malignancy. Recommend proceeding with video capsule                             endoscopy to evaluate the remaining small bowel to                            rule out occult malignancy given personal history                            of breast cancer, uterine cancer and recurrent                            dermatomyositis.                           - Repeat colonoscopy in 5 years for surveillance. Jerene Bears, MD 03/26/2018 3:34:39 PM This report has been signed electronically.

## 2018-03-27 ENCOUNTER — Telehealth: Payer: Self-pay

## 2018-03-27 NOTE — Telephone Encounter (Signed)
  Follow up Call-  Call back number 03/26/2018  Post procedure Call Back phone  # [phone]  Permission to leave phone message Yes  Some recent data might be hidden     Patient questions:  Do you have a fever, pain , or abdominal swelling? No. Pain Score  0 *  Have you tolerated food without any problems? Yes.    Have you been able to return to your normal activities? Yes.    Do you have any questions about your discharge instructions: Diet   No. Medications  No. Follow up visit  No.  Do you have questions or concerns about your Care? No.  Actions: * If pain score is 4 or above: No action needed, pain <4.  No problems noted per pt. maw

## 2018-04-01 ENCOUNTER — Encounter: Payer: Self-pay | Admitting: Internal Medicine

## 2018-04-06 ENCOUNTER — Encounter

## 2018-04-14 ENCOUNTER — Encounter: Payer: Self-pay | Admitting: Cardiovascular Disease

## 2018-04-14 ENCOUNTER — Ambulatory Visit: Payer: Managed Care, Other (non HMO) | Admitting: Cardiovascular Disease

## 2018-04-14 VITALS — BP 122/84 | HR 88 | Ht 65.0 in | Wt 286.0 lb

## 2018-04-14 DIAGNOSIS — Z789 Other specified health status: Secondary | ICD-10-CM | POA: Diagnosis not present

## 2018-04-14 DIAGNOSIS — I10 Essential (primary) hypertension: Secondary | ICD-10-CM

## 2018-04-14 DIAGNOSIS — R0789 Other chest pain: Secondary | ICD-10-CM | POA: Diagnosis not present

## 2018-04-14 DIAGNOSIS — M3390 Dermatopolymyositis, unspecified, organ involvement unspecified: Secondary | ICD-10-CM

## 2018-04-14 NOTE — Patient Instructions (Addendum)
Medication Instructions:  Dr Croitoru recommends that you continue on your current medications as directed. Please refer to the Current Medication list given to you today.  If you need a refill on your cardiac medications before your next appointment, please call your pharmacy.   Follow-Up: At [facility], you and your health needs are our priority.  As part of our continuing mission to provide you with exceptional heart care, we have created designated Provider Care Teams.  These Care Teams include your primary Cardiologist (physician) and Advanced Practice Providers (APPs -  Physician Assistants and Nurse Practitioners) who all work together to provide you with the care you need, when you need it. You will need a follow up appointment in 12 months.  Please call our office 2 months in advance to schedule this appointment.  You may see Mihai Croitoru, MD or one of the following Advanced Practice Providers on your designated Care Team: Hao Meng, PA-C . Jaimi Belle Duke, PA-C . You will receive a reminder letter in the mail two months in advance. If you don't receive a letter, please call our office to schedule the follow-up appointment. 

## 2018-04-14 NOTE — Progress Notes (Signed)
Cardiology Office Note:    Date:  04/19/2018   ID:  Gail Schmitt, DOB 25-May-1960, MRN [id number]  PCP:  [name], MD  Cardiologist:  Sanda Klein, MD  Electrophysiologist:  None   Referring MD: [name], MD   Chief Complaint  Patient presents with  . Follow-up  chest pain; echo  History of Present Illness:    Gail Schmitt is a 57 y.o. female with a hx of dermatomyositis, hypertension, remote history of pulmonary embolism, breast cancer with left-sided mastectomy, with multiple previous evaluations for chest pain including normal coronary angiography in 2016 for false positive nuclear stress test.  She was hospitalized in August 2019 with chest pain.  Cardiac enzymes are normal.  Coronary CT angiography was negative for pulmonary medicine.  The coronary calcium score was 0.  Echocardiogram showed borderline LVEF of 50% with mild LVH, but without overt diastolic dysfunction.  Echo did not confirm the reported diagnosis of mitral valve prolapse  Rheumatology follow-up has shown recurrence of dermatomyositis (Dr. Annamaria Boots).  Her chest pain has improved once she began treatment with steroids, which has now been transitioned to methotrexate.  Multiple joint complaints have also improved and she is no longer walking with a cane and is about to start physical therapy.  She returns in follow-up and feels well.  No current complaints of chest pain or dyspnea.  Past Medical History:  Diagnosis Date  . Arthritis    "left hip; hands" (07/28/2014)  . Breast cancer, left breast ([place]) 1999   a. s/p left mastectomy  . Dermatomyositis ([place])    a. noted prior to diagnosis of Breast CA ("i'm allergic to cancer")  . 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 Family history of adverse reaction to anesthesia    "all my daughters get real sick and throw up alot"  . Fatty liver   . Headache    "maybe monthly" (07/28/2014)  . Heart murmur   . Hypertension   . Mitral  valve prolapse   . Normal coronary arteries    last cath 2016  . Obesity   . Pneumonia    "haven't had it in the last 4 yrs; before that I had it q yr for 4-5 years" (07/28/2014)  . Pulmonary embolism ([place]) ~ 2003  . RBBB    with symptomatic bradycardia  . Tubular adenoma of colon   . Unstable angina ([place])    a. Cath ~ 10 + yrs ago, [facility], [place] - "normal but pressures were high."  . Uterine cancer ([place]) 2007    Past Surgical History:  Procedure Laterality Date  . BREAST BIOPSY Left 1999  . CARDIAC CATHETERIZATION  07/13/2011  . CHOLECYSTECTOMY N/A 07/31/2014   Procedure: LAPAROSCOPIC CHOLECYSTECTOMY WITH INTRAOPERATIVE CHOLANGIOGRAM;  Surgeon: Erroll Luna, MD;  Location: [facility];  Service: General;  Laterality: N/A;  . INGUINAL HERNIA REPAIR Left 1986  . LAPAROSCOPIC TOTAL HYSTERECTOMY    . LEFT HEART CATHETERIZATION WITH CORONARY ANGIOGRAM N/A 07/13/2011   Procedure: LEFT HEART CATHETERIZATION WITH CORONARY ANGIOGRAM;  Surgeon: Josue Hector, MD;  Location: [facility] CATH LAB;  Service: Cardiovascular;  Laterality: N/A;  . LEFT HEART CATHETERIZATION WITH CORONARY ANGIOGRAM N/A 07/30/2014   Procedure: LEFT HEART CATHETERIZATION WITH CORONARY ANGIOGRAM;  Surgeon: [name], MD;  Location: [facility] C[facility] LAB;  Service: Cardiovascular;  Laterality: N/A;  . MASTECTOMY Left 1999  . RECONSTRUCTION BREAST IMMEDIATE / DELAYED W/ TISSUE EXPANDER Left 2005  . TOTAL ABDOMINAL HYSTERECTOMY  2007  .  TUBAL LIGATION  1990    Current Medications: Current Meds  Medication Sig  . amLODipine (NORVASC) 5 MG tablet Take 1 tablet (5 mg total) by mouth daily.  . calcium carbonate (TUMS EX) 750 MG chewable tablet Chew 2 tablets by mouth as needed for heartburn.  . folic acid (FOLVITE) 1 MG tablet Take 1 mg by mouth daily.  . hydrochlorothiazide (HYDRODIURIL) 25 MG tablet Take 1 tablet (25 mg total) by mouth daily.  Marland Kitchen ibuprofen (ADVIL,MOTRIN) 400 MG tablet Take 400 mg by mouth every 6 (six) hours as  needed.  Marland Kitchen lisinopril (PRINIVIL,ZESTRIL) 40 MG tablet Take 1 tablet (40 mg total) by mouth daily.  . methotrexate (RHEUMATREX) 2.5 MG tablet Take 3 tablets by mouth 2 (two) times a week. 3 tablets Friday night and 3 tablets Saturday AM  . Multiple Vitamin (MULTIVITAMIN WITH MINERALS) TABS tablet Take 1 tablet by mouth daily.  . nitroGLYCERIN (NITROSTAT) 0.4 MG SL tablet Place 1 tablet (0.4 mg total) under the tongue every 5 (five) minutes as needed for chest pain.  . [DISCONTINUED] predniSONE (DELTASONE) 10 MG tablet Take by mouth. Tapering down , currently on 10mg  daily     Allergies:   Percocet [oxycodone-acetaminophen]   Social History   Socioeconomic History  . Marital status: Divorced    Spouse name: Not on file  . Number of children: 3  . Years of education: Not on file  . Highest education level: Not on file  Occupational History  . Occupation: [employer]  Social Needs  . Financial resource strain: Not on file  . Food insecurity:    Worry: Not on file    Inability: Not on file  . Transportation needs:    Medical: Not on file    Non-medical: Not on file  Tobacco Use  . Smoking status: Never Smoker  . Smokeless tobacco: Never Used  Substance and Sexual Activity  . Alcohol use: Not Currently    Alcohol/week: 1.0 standard drinks    Types: 1 Standard drinks or equivalent per week    Comment: 07/28/2014 "might have 1 drink a couple times/month"  . Drug use: No  . Sexual activity: Not Currently  Lifestyle  . Physical activity:    Days per week: Not on file    Minutes per session: Not on file  . Stress: Not on file  Relationships  . Social connections:    Talks on phone: Not on file    Gets together: Not on file    Attends religious service: Not on file    Active member of club or organization: Not on file    Attends meetings of clubs or organizations: Not on file    Relationship status: Not on file  Other Topics Concern  . Not on file  Social History  Narrative   Lives @ home in [place] with 1 of her 3 dtrs.  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  Doesn't follow any particular diet.     Family History: The patient's family history includes Colon polyps in her mother; Diabetes in her sister; Diabetes type II in her mother; Other in her mother; Stroke in her brother. There is no history of Colon cancer or Breast cancer.  ROS:   Please see the history of present illness.     All other systems reviewed and are negative.  EKGs/Labs/Other Studies Reviewed:    The following studies were reviewed today: Chest  CT and echo from August 2019  EKG:  EKG is not ordered today.  The ekg ordered 01/09/2018 today demonstrates normal sinus rhythm, nonspecific repolarization changes.  Recent Labs: 12/19/2017: ALT 17; B Natriuretic Peptide 159.8; TSH 1.206 12/20/2017: BUN 15; Creatinine, Ser 0.73; Potassium 3.7; Sodium 139 12/21/2017: Hemoglobin 11.0; Platelets 188  Recent Lipid Panel    Component Value Date/Time   CHOL 163 12/20/2017 0550   TRIG 56 12/20/2017 0550   HDL 47 12/20/2017 0550   CHOLHDL 3.5 12/20/2017 0550   VLDL 11 12/20/2017 0550   LDLCALC 105 (H) 12/20/2017 0550    Physical Exam:    VS:  BP 122/84   Pulse 88   Ht 5\' 5"  (1.651 m)   Wt 286 lb (129.7 kg)   BMI 47.59 kg/m     Wt Readings from Last 3 Encounters:  04/14/18 286 lb (129.7 kg)  03/26/18 281 lb (127.5 kg)  03/11/18 281 lb (127.5 kg)     GEN: Morbidly obese well nourished, well developed in no acute distress HEENT: Normal NECK: No JVD; No carotid bruits LYMPHATICS: No lymphadenopathy CARDIAC: RRR, no murmurs, rubs, gallops RESPIRATORY:  Clear to auscultation without rales, wheezing or rhonchi  ABDOMEN: Soft, non-tender, non-distended MUSCULOSKELETAL:  No edema; No deformity  SKIN: Warm and dry NEUROLOGIC:  Alert and oriented x 3 PSYCHIATRIC:  Normal affect   ASSESSMENT:    1. Other chest pain   2.  Essential hypertension   3. Dermatomyositis ([place])   4. False positive stress test    PLAN:    In order of problems listed above:  1. Chest pain: Resolved with treatment for rheumatological disorder 2. HTN: Well-controlled 3. Dermatomyositis: Followed by Dr. Annamaria Boots in the [facility] office.   Medication Adjustments/Labs and Tests Ordered: Current medicines are reviewed at length with the patient today.  Concerns regarding medicines are outlined above.  No orders of the defined types were placed in this encounter.  No orders of the defined types were placed in this encounter.   Patient Instructions  Medication Instructions:  Dr [name] recommends that you continue on your current medications as directed. Please refer to the Current Medication list given to you today.  If you need a refill on your cardiac medications before your next appointment, please call your pharmacy.   Follow-Up: At [facility], you and your health needs are our priority.  As part of our continuing mission to provide you with exceptional heart care, we have created designated Provider Care Teams.  These Care Teams include your primary Cardiologist (physician) and Advanced Practice Providers (APPs -  Physician Assistants and Nurse Practitioners) who all work together to provide you with the care you need, when you need it. You will need a follow up appointment in 12 months.  Please call our office 2 months in advance to schedule this appointment.  You may see Sanda Klein, MD or one of the following Advanced Practice Providers on your designated Care Team: [place], [place] . Fabian Sharp, PA-C . You will receive a reminder letter in the mail two months in advance. If you don't receive a letter, please call our office to schedule the follow-up appointment.    Signed, Sanda Klein, MD  04/19/2018 11:47 AM    [place]

## 2018-04-15 ENCOUNTER — Other Ambulatory Visit: Payer: Self-pay

## 2018-04-15 DIAGNOSIS — M3310 Other dermatopolymyositis, organ involvement unspecified: Secondary | ICD-10-CM

## 2018-04-19 DIAGNOSIS — Z789 Other specified health status: Secondary | ICD-10-CM | POA: Insufficient documentation

## 2018-04-22 ENCOUNTER — Telehealth: Payer: Self-pay

## 2018-04-22 NOTE — Telephone Encounter (Signed)
Pt scheduled for capsule endo 05/06/18@8 :30am. Pt instructed over the phone and instructions sent to pt via mychart.

## 2018-04-24 ENCOUNTER — Ambulatory Visit (HOSPITAL_COMMUNITY)
Admission: EM | Admit: 2018-04-24 | Discharge: 2018-04-24 | Disposition: A | Discharge status: Home / Self Care | Payer: Managed Care, Other (non HMO) | Attending: Family Medicine | Admitting: Family Medicine

## 2018-04-24 ENCOUNTER — Encounter (HOSPITAL_COMMUNITY): Payer: Self-pay | Admitting: Family Medicine

## 2018-04-24 ENCOUNTER — Other Ambulatory Visit: Payer: Self-pay

## 2018-04-24 DIAGNOSIS — M3390 Dermatopolymyositis, unspecified, organ involvement unspecified: Secondary | ICD-10-CM | POA: Diagnosis not present

## 2018-04-24 MED ORDER — PREDNISONE 20 MG PO TABS: ORAL_TABLET | ORAL | 0 refills | Status: DC

## 2018-04-24 NOTE — ED Provider Notes (Signed)
[place]    CSN: [id number] Arrival date & time: 04/24/18  1040     History   Chief Complaint Chief Complaint  Patient presents with  . Leg Swelling  . hand swelling    HPI Gail Schmitt is a 57 y.o. female.   This a 57 year old woman who comes in complaining about swelling in her extremities and chest pain.  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  It has been in remission until July of this year when it flared up again.  She has been taking prednisone for a while and was transitioned to methotrexate, but in the last few days, her hands and feet have swollen along with her facial muscles and she has developed myalgias.  Her rheumatologist is out of town till Monday.  She would like to get back on prednisone until she can see her rheumatologist.   Patient has a history of chest pain, hypertension, dermatomyositis, and pulmonary embolus.  Cardiology note from December 9: GERRIANNE AYDELOTT is a 57 y.o. female with a hx of dermatomyositis, hypertension, remote history of pulmonary embolism, breast cancer with left-sided mastectomy, with multiple previous evaluations for chest pain including normal coronary angiography in 2016 for false positive nuclear stress test.  She was hospitalized in August 2019 with chest pain.  Cardiac enzymes are normal.  Coronary CT angiography was negative for pulmonary medicine.  The coronary calcium score was 0.  Echocardiogram showed borderline LVEF of 50% with mild LVH, but without overt diastolic dysfunction.  Echo did not confirm the reported diagnosis of mitral valve prolapse  Rheumatology follow-up has shown recurrence of dermatomyositis (Dr. Annamaria Boots).  Her chest pain has improved once she began treatment with steroids, which has now been transitioned to methotrexate.  Multiple joint complaints have also improved and she is no longer walking with a cane and is about to start physical therapy.     Past  Medical History:  Diagnosis Date  . Arthritis    "left hip; hands" (07/28/2014)  . Breast cancer, left breast ([place]) 1999   a. s/p left mastectomy  . Dermatomyositis ([place])    a. noted prior to diagnosis of Breast CA ("i'm allergic to cancer")  . 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 Family history of adverse reaction to anesthesia    "all my daughters get real sick and throw up alot"  . Fatty liver   . Headache    "maybe monthly" (07/28/2014)  . Heart murmur   . Hypertension   . Mitral valve prolapse   . Normal coronary arteries    last cath 2016  . Obesity   . Pneumonia    "haven't had it in the last 4 yrs; before that I had it q yr for 4-5 years" (07/28/2014)  . Pulmonary embolism ([place]) ~ 2003  . RBBB    with symptomatic bradycardia  . Tubular adenoma of colon   . Unstable angina ([place])    a. Cath ~ 10 + yrs ago, [facility], [place] - "normal but pressures were high."  . Uterine cancer [facility]) 2007    Patient Active Problem List   Diagnosis Date Noted  . False positive stress test 04/19/2018  . Beta-blockers contraindicated due to bradycardia 01/09/2018  . Normal coronary arteries 01/09/2018  . History of breast cancer 01/09/2018  . History of pulmonary embolus (PE) 01/09/2018  . Dermatomyositis ([place])   . Symptomatic cholelithiasis 07/06/2014  . Vitamin D deficiency 06/23/2014  .  Fatty infiltration of liver 06/23/2014  . Obesity   . Chest pain 07/12/2011  . Essential hypertension 07/12/2011    Past Surgical History:  Procedure Laterality Date  . BREAST BIOPSY Left 1999  . CARDIAC CATHETERIZATION  07/13/2011  . CHOLECYSTECTOMY N/A 07/31/2014   Procedure: LAPAROSCOPIC CHOLECYSTECTOMY WITH INTRAOPERATIVE CHOLANGIOGRAM;  Surgeon: Erroll Luna, MD;  Location: [place];  Service: General;  Laterality: N/A;  . INGUINAL HERNIA REPAIR Left 1986  . LAPAROSCOPIC TOTAL HYSTERECTOMY    . LEFT HEART CATHETERIZATION WITH CORONARY ANGIOGRAM N/A 07/13/2011   Procedure:  LEFT HEART CATHETERIZATION WITH CORONARY ANGIOGRAM;  Surgeon: Josue Hector, MD;  Location: [facility] CATH LAB;  Service: Cardiovascular;  Laterality: N/A;  . LEFT HEART CATHETERIZATION WITH CORONARY ANGIOGRAM N/A 07/30/2014   Procedure: LEFT HEART CATHETERIZATION WITH CORONARY ANGIOGRAM;  Surgeon: [name], MD;  Location[facility] C[facility] LAB;  Service: Cardiovascular;  Laterality: N/A;  . MASTECTOMY Left 1999  . RECONSTRUCTION BREAST IMMEDIATE / DELAYED W/ TISSUE EXPANDER Left 2005  . TOTAL ABDOMINAL HYSTERECTOMY  2007  . TUBAL LIGATION  1990    OB History   No obstetric history on file.      Home Medications    Prior to Admission medications   Medication Sig Start Date End Date Taking? Authorizing Provider  calcium carbonate (TUMS EX) 750 MG chewable tablet Chew 2 tablets by mouth as needed for heartburn.   Yes [provider]  folic acid (FOLVITE) 1 MG tablet Take 1 mg by mouth daily.   Yes [provider]  hydrochlorothiazide (HYDRODIURIL) 25 MG tablet Take 1 tablet (25 mg total) by mouth daily. 12/22/17  Yes Eileen Stanford, PA-C  ibuprofen (ADVIL,MOTRIN) 400 MG tablet Take 400 mg by mouth every 6 (six) hours as needed.   Yes [provider]  lisinopril (PRINIVIL,ZESTRIL) 40 MG tablet Take 1 tablet (40 mg total) by mouth daily. 12/22/17  Yes Eileen Stanford, PA-C  methotrexate (RHEUMATREX) 2.5 MG tablet Take 3 tablets by mouth 2 (two) times a week. 3 tablets Friday night and 3 tablets Saturday AM 03/04/18  Yes [provider]  Multiple Vitamin (MULTIVITAMIN WITH MINERALS) TABS tablet Take 1 tablet by mouth daily.   Yes [provider]  nitroGLYCERIN (NITROSTAT) 0.4 MG SL tablet Place 1 tablet (0.4 mg total) under the tongue every 5 (five) minutes as needed for chest pain. 12/21/17 12/21/18 Yes Eileen Stanford, PA-C  amLODipine (NORVASC) 5 MG tablet Take 1 tablet (5 mg total) by mouth daily. 01/09/18 04/14/18  Erlene Quan, PA-C    predniSONE (DELTASONE) 20 MG tablet Two daily with food 04/24/18   Robyn Haber, MD    Family History Family History  Problem Relation Age of Onset  . Diabetes type II Mother   . Colon polyps Mother   . Other Mother        cholangiocarcinoma  . Diabetes Sister   . Stroke Brother   . Colon cancer Neg Hx   . Breast cancer Neg Hx     Social History Social History   Tobacco Use  . Smoking status: Never Smoker  . Smokeless tobacco: Never Used  Substance Use Topics  . Alcohol use: Not Currently    Alcohol/week: 1.0 standard drinks    Types: 1 Standard drinks or equivalent per week    Comment: 07/28/2014 "might have 1 drink a couple times/month"  . Drug use: No     Allergies   Percocet [oxycodone-acetaminophen]   Review of Systems  Review of Systems   Physical Exam Triage Vital Signs ED Triage Vitals  Enc Vitals Group     BP      Pulse      Resp      Temp      Temp src      SpO2      Weight      Height      Head Circumference      Peak Flow      Pain Score      Pain Loc      Pain Edu?      Excl. in [place]?    No data found.  Updated Vital Signs BP 129/90 (BP Location: Right Arm)   Pulse 82   Temp (!) 97.1 F (36.2 C) (Oral)   SpO2 99%    Physical Exam Vitals signs and nursing note reviewed.  Constitutional:      Appearance: Normal appearance. She is obese. She is not ill-appearing or toxic-appearing.  HENT:     Head:     Comments: Patient has moon facies    Right Ear: External ear normal.     Left Ear: External ear normal.     Nose: Nose normal.     Mouth/Throat:     Mouth: Mucous membranes are moist.  Eyes:     Conjunctiva/sclera: Conjunctivae normal.  Neck:     Musculoskeletal: Normal range of motion and neck supple.  Cardiovascular:     Rate and Rhythm: Normal rate.     Heart sounds: Normal heart sounds.  Pulmonary:     Effort: Pulmonary effort is normal.     Breath sounds: Normal breath sounds.  Musculoskeletal:     Comments:  1-2+ edema with tender calf muscles.  She does have moon face ease with swollen muscles in her face.  Skin:    General: Skin is warm and dry.  Neurological:     General: No focal deficit present.     Mental Status: She is alert.  Psychiatric:        Mood and Affect: Mood normal.      UC Treatments / Results  Labs (all labs ordered are listed, but only abnormal results are displayed) Labs Reviewed - No data to display  EKG None  Radiology No results found.  Procedures Procedures (including critical care time) Echocardiogram done in August of this year: - Technically difficult study with poor acoustic windows. Normal LV   size with mild LV hypertrophy. EF 50%, diffuse hypokinesis.   Diastolic function appears normal. Normal RV size and systolic   function. No significant valvular abnormalities. Medications Ordered in UC Medications - No data to display  Initial Impression / Assessment and Plan / UC Course  I have reviewed the triage vital signs and the nursing notes.  Pertinent labs & imaging results that were available during my care of the patient were reviewed by me and considered in my medical decision making (see chart for details).    Final Clinical Impressions(s) / UC Diagnoses   Final diagnoses:  Dermatomyositis [facility])     Discharge Instructions     See your Rheumatologist on Monday  Take the prednisone 40 mg (2 tabs) today and tomorrow, then just 20 mg (one tablet) until you see your doctor    ED Prescriptions    Medication Sig Dispense Auth. Provider   predniSONE (DELTASONE) 20 MG tablet Two daily with food 10 tablet Robyn Haber, MD     Controlled Substance  Prescriptions [place] Controlled Substance Registry consulted? Not Applicable   Robyn Haber, MD 04/24/18 1150

## 2018-04-24 NOTE — ED Triage Notes (Signed)
Pt has a hx of Dermatomyositis which causes her chest pain.  She was recently put on Methotrexate and she is now experiencing hand and foot swelling. She called her Dr, but they are out of town so they recommended she be seen here for further evaluation because she feels the medication is not working for her.

## 2018-04-24 NOTE — Discharge Instructions (Addendum)
See your Rheumatologist on Monday  Take the prednisone 40 mg (2 tabs) today and tomorrow, then just 20 mg (one tablet) until you see your doctor

## 2018-05-06 ENCOUNTER — Ambulatory Visit (INDEPENDENT_AMBULATORY_CARE_PROVIDER_SITE_OTHER): Payer: Managed Care, Other (non HMO) | Admitting: Internal Medicine

## 2018-05-06 ENCOUNTER — Encounter: Payer: Self-pay | Admitting: Internal Medicine

## 2018-05-06 DIAGNOSIS — R197 Diarrhea, unspecified: Secondary | ICD-10-CM

## 2018-05-06 DIAGNOSIS — M3390 Dermatopolymyositis, unspecified, organ involvement unspecified: Secondary | ICD-10-CM

## 2018-05-06 NOTE — Progress Notes (Signed)
  LOT 06-26-7 EXP 07-26-2017 SN [id number]  Pt tolerated well

## 2018-05-06 NOTE — Patient Instructions (Signed)

## 2018-05-23 ENCOUNTER — Telehealth: Payer: Self-pay

## 2018-05-23 NOTE — Telephone Encounter (Signed)
Called pt back and left her a detailed message regarding capsule result.

## 2018-05-23 NOTE — Telephone Encounter (Signed)
Left message for pt to call back. Capsule endo showed no evidence of small bowel malignancy or IBD.

## 2018-05-23 NOTE — Telephone Encounter (Signed)
Patient retuning call to [facility]. Patient requesting call back after 4pm today

## 2018-08-11 ENCOUNTER — Other Ambulatory Visit: Payer: Self-pay

## 2018-08-11 ENCOUNTER — Emergency Department (HOSPITAL_COMMUNITY)
Admission: EM | Admit: 2018-08-11 | Discharge: 2018-08-12 | Disposition: A | Discharge status: Home / Self Care | Payer: Medicaid Other | Attending: Emergency Medicine | Admitting: Emergency Medicine

## 2018-08-11 ENCOUNTER — Encounter (HOSPITAL_COMMUNITY): Payer: Self-pay | Admitting: Emergency Medicine

## 2018-08-11 DIAGNOSIS — M5020 Other cervical disc displacement, unspecified cervical region: Secondary | ICD-10-CM

## 2018-08-11 DIAGNOSIS — Z79899 Other long term (current) drug therapy: Secondary | ICD-10-CM | POA: Insufficient documentation

## 2018-08-11 DIAGNOSIS — I10 Essential (primary) hypertension: Secondary | ICD-10-CM | POA: Diagnosis not present

## 2018-08-11 DIAGNOSIS — Z853 Personal history of malignant neoplasm of breast: Secondary | ICD-10-CM | POA: Insufficient documentation

## 2018-08-11 DIAGNOSIS — M5412 Radiculopathy, cervical region: Secondary | ICD-10-CM | POA: Diagnosis not present

## 2018-08-11 DIAGNOSIS — M542 Cervicalgia: Secondary | ICD-10-CM | POA: Diagnosis present

## 2018-08-11 NOTE — ED Triage Notes (Signed)
Pt here for right side neck pain x 1 week that is keeping her from sleeping. Denies injury. Taking prednisone from Dr., not working she states. Pt says she is waking up and arm, and tongue are numb. Denies sick contacts or travel.

## 2018-08-12 ENCOUNTER — Emergency Department (HOSPITAL_COMMUNITY): Payer: Medicaid Other

## 2018-08-12 MED ORDER — TRAMADOL HCL 50 MG PO TABS: 50.0000 mg | ORAL_TABLET | Freq: Four times a day (QID) | ORAL | 0 refills | Status: DC | PRN

## 2018-08-12 MED ORDER — TRAMADOL HCL 50 MG PO TABS
50.0000 mg | ORAL_TABLET | Freq: Once | ORAL | Status: AC
  Administered 2018-08-12: 02:00:00 50 mg via ORAL
  Filled 2018-08-12: qty 1

## 2018-08-12 MED ORDER — CYCLOBENZAPRINE HCL 10 MG PO TABS
5.0000 mg | ORAL_TABLET | Freq: Once | ORAL | Status: AC
  Administered 2018-08-12: 5 mg via ORAL
  Filled 2018-08-12: qty 1

## 2018-08-12 MED ORDER — LORAZEPAM 1 MG PO TABS
1.0000 mg | ORAL_TABLET | Freq: Once | ORAL | Status: AC
  Administered 2018-08-12: 1 mg via ORAL
  Filled 2018-08-12: qty 1

## 2018-08-12 MED ORDER — DEXAMETHASONE SODIUM PHOSPHATE 10 MG/ML IJ SOLN
10.0000 mg | Freq: Once | INTRAMUSCULAR | Status: AC
  Administered 2018-08-12: 02:00:00 10 mg via INTRAMUSCULAR
  Filled 2018-08-12: qty 1

## 2018-08-12 MED ORDER — KETOROLAC TROMETHAMINE 30 MG/ML IJ SOLN
30.0000 mg | Freq: Once | INTRAMUSCULAR | Status: AC
  Administered 2018-08-12: 03:00:00 30 mg via INTRAMUSCULAR
  Filled 2018-08-12: qty 1

## 2018-08-12 MED ORDER — METHYLPREDNISOLONE 4 MG PO TBPK: ORAL_TABLET | ORAL | 0 refills | Status: DC

## 2018-08-12 NOTE — ED Provider Notes (Signed)
[facility] EMERGENCY DEPARTMENT Provider Note   CSN: [id number] Arrival date & time: 08/11/18  2126    History   Chief Complaint Chief Complaint  Patient presents with   Neck Pain    HPI Gail Schmitt is a 58 y.o. female.     HPI This is a 58 year old female with a history of breast cancer, dermatomyositis, hypertension, obesity who presents with neck pain.  Patient reports 2 to 3-week history of neck pain mostly of the right side.  She states that she woke up one night and had pain in the right side of the neck that radiated down her right arm.  She saw her primary physician who thought she had a pinched nerve.  She was started on prednisone.  She finished prednisone yesterday.  She states that she has had minimal relief.  She reports continued pain which is worse with certain movements of the neck.  She states that when she lifts her head up she feels pain and numbness into her jaw and the right side of her tongue.  Additionally when she turns to the right pain goes down her right arm.  She denies any weakness in her arm, hand.  She states initially prior to taking prednisone that she was unable to abduct her right arm.  This has improved.  She rates her pain 8 out of 10.  She does take muscle relaxers which have not helped.  Otherwise she has avoided pain medication.   Past Medical History:  Diagnosis Date   Arthritis    "left hip; hands" (07/28/2014)   Breast cancer, left breast ([place]) 1999   a. s/p left mastectomy   Dermatomyositis ([place])    a. 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 Cath ~ 10 + yrs ago, [facility], [place] - "normal but pressures were high."   Uterine cancer ([place]) 2007    Patient Active Problem List   Diagnosis Date Noted   False positive stress test 04/19/2018   Beta-blockers contraindicated due to bradycardia 01/09/2018   Normal coronary arteries 01/09/2018   History of breast cancer 01/09/2018   History of pulmonary embolus (PE) 01/09/2018   Dermatomyositis ([facility])    Symptomatic cholelithiasis 07/06/2014   Vitamin D deficiency 06/23/2014   Fatty infiltration of liver 06/23/2014   Obesity    Chest pain 07/12/2011   Essential hypertension 07/12/2011    Past Surgical History:  Procedure Laterality Date   BREAST BIOPSY Left 1999   CARDIAC CATHETERIZATION  07/13/2011   CHOLECYSTECTOMY N/A 07/31/2014   Procedure: LAPAROSCOPIC CHOLECYSTECTOMY WITH INTRAOPERATIVE CHOLANGIOGRAM;  Surgeon: Erroll Luna, MD;  Location: [place];  Service: General;  Laterality: N/A;   INGUINAL HERNIA REPAIR  Left 1986   LAPAROSCOPIC TOTAL HYSTERECTOMY     LEFT HEART CATHETERIZATION WITH CORONARY ANGIOGRAM N/A 07/13/2011   Procedure: LEFT HEART CATHETERIZATION WITH CORONARY ANGIOGRAM;  Surgeon: Josue Hector, MD;  Location: [facility] CATH LAB;  Service: Cardiovascular;  Laterality: N/A;   LEFT HEART CATHETERIZATION WITH CORONARY ANGIOGRAM N/A 07/30/2014   Procedure: LEFT HEART CATHETERIZATION WITH CORONARY ANGIOGRAM;  Surgeon: [name], MD;  Location: [facility] CATH LAB;  Service: Cardiovascular;  Laterality: N/A;   MASTECTOMY Left 1999   RECONSTRUCTION BREAST IMMEDIATE / DELAYED W/ TISSUE EXPANDER Left 2005   TOTAL ABDOMINAL HYSTERECTOMY  2007   TUBAL LIGATION  1990     OB History   No obstetric history on file.       Home Medications    Prior to Admission medications   Medication Sig Start Date End Date Taking? Authorizing Provider  amLODipine (NORVASC) 5 MG tablet Take 1 tablet (5 mg total) by mouth daily. 01/09/18 08/12/27 Yes Kilroy, Luke K, PA-C  cyclobenzaprine (FLEXERIL) 10 MG tablet Take 10 mg by mouth every 8 (eight) hours. 06/04/18  Yes [provider]  folic acid (FOLVITE) 1 MG tablet Take 1 mg by mouth daily.   Yes [provider]  hydrochlorothiazide (HYDRODIURIL) 25 MG tablet Take 1 tablet (25 mg total) by mouth daily. 12/22/17  Yes Eileen Stanford, PA-C  hydrOXYzine (ATARAX/VISTARIL) 25 MG tablet Take 25 mg by mouth every 8 (eight) hours as needed for itching.  07/31/18  Yes [provider]  lisinopril (PRINIVIL,ZESTRIL) 40 MG tablet Take 1 tablet (40 mg total) by mouth daily. 12/22/17  Yes Eileen Stanford, PA-C  methotrexate (RHEUMATREX) 2.5 MG tablet Take 3 tablets by mouth 2 (two) times a week. On Friday and Saturday 03/04/18  Yes [provider]  Multiple Vitamin (MULTIVITAMIN WITH MINERALS) TABS tablet Take 1 tablet by mouth daily.   Yes [provider]  nitroGLYCERIN (NITROSTAT) 0.4 MG SL tablet Place 1 tablet (0.4 mg total) under the tongue every 5 (five) minutes as needed for chest pain. 12/21/17 12/21/18 Yes Eileen Stanford, PA-C  ibuprofen (ADVIL,MOTRIN) 400 MG tablet Take 400 mg by mouth every 6 (six) hours as needed.    [provider]  methylPREDNISolone (MEDROL DOSEPAK) 4 MG TBPK tablet Take as directed on packet 08/12/18   Tiffini Blacksher, Barbette Hair, MD  predniSONE (DELTASONE) 20 MG tablet Two daily with food Patient not taking: Reported on 08/12/2018 04/24/18   Robyn Haber, MD  traMADol (ULTRAM) 50 MG tablet Take 1 tablet (50 mg total) by mouth every 6 (six) hours as needed. 08/12/18   Denaisha Swango, Barbette Hair, MD    Family History Family History  Problem Relation Age of Onset   Diabetes type II Mother    Colon polyps Mother     Other Mother        cholangiocarcinoma   Diabetes Sister    Stroke Brother    Colon cancer Neg Hx    Breast cancer Neg Hx     Social History Social History   Tobacco Use   Smoking status: Never Smoker   Smokeless tobacco: Never Used  Substance Use Topics   Alcohol use: Not Currently    Alcohol/week: 1.0 standard drinks    Types: 1 Standard drinks or equivalent per week    Comment: 07/28/2014 "might have 1 drink a couple times/month"   Drug use: No     Allergies   Percocet [oxycodone-acetaminophen]   Review of Systems Review of Systems  Constitutional: Negative for fever.  Respiratory: Negative for shortness of breath.   Cardiovascular: Negative for chest pain.  Gastrointestinal: Negative for abdominal pain, nausea and vomiting.  Genitourinary: Negative for dysuria.  Musculoskeletal: Positive for neck pain. Negative for neck stiffness.  Neurological: Negative for weakness.  All other systems reviewed and are negative.    Physical Exam Updated Vital Signs BP (!) 125/100 (BP Location: Right Arm)    Pulse 89    Temp 97.8 F (36.6 C) (Oral)    Resp 16    SpO2 99%   Physical Exam Vitals signs and nursing note reviewed.  Constitutional:      Appearance: She is well-developed. She is obese.  HENT:     Head: Normocephalic and atraumatic.  Eyes:     Pupils: Pupils are equal, round, and reactive to light.  Neck:     Musculoskeletal: Normal range of motion and neck supple.     Comments: Denies palpation of the right side of the neck and the paraspinous muscle region, no midline tenderness to palpation, step-off, deformity Cardiovascular:     Rate and Rhythm: Normal rate and regular rhythm.     Pulses: Normal pulses.  Pulmonary:     Effort: Pulmonary effort is normal. No respiratory distress.  Abdominal:     Palpations: Abdomen is soft.     Tenderness: There is no abdominal tenderness.  Musculoskeletal: Normal range of motion.  Skin:    General: Skin is  warm and dry.  Neurological:     Mental Status: She is alert and oriented to person, place, and time.     Comments: 5 out of 5 strength with grip strength bilaterally biceps, triceps, and deltoid strength, cranial nerves II through XII intact      ED Treatments / Results  Labs (all labs ordered are listed, but only abnormal results are displayed) Labs Reviewed - No data to display  EKG None  Radiology Mr Cervical Spine Wo Contrast  Result Date: 08/12/2018 CLINICAL DATA:  Right-sided neck pain for 1 week that is preventing sleep. Right arm numbness EXAM: MRI CERVICAL SPINE WITHOUT CONTRAST TECHNIQUE: Multiplanar, multisequence MR imaging of the cervical spine was performed. No intravenous contrast was administered. COMPARISON:  None. FINDINGS: Alignment: Straightening of the cervical spine Vertebrae: No fracture, evidence of discitis, or bone lesion. Mild discogenic marrow edema at C6-7. Cord: Normal signal and morphology Posterior Fossa, vertebral arteries, paraspinal tissues: Retropharyngeal course of the bilateral ICA. Disc levels: C2-3: Unremarkable. C3-4: Mild disc bulging.  No impingement C4-5: Degenerative disc narrowing with bulging and endplate/uncovertebral ridging. Spinal stenosis with ventral cord contact. High-grade right and more mild left foraminal stenosis. C5-6: Disc narrowing with bulge and uncovertebral ridging. Mild left foraminal narrowing. There is a right paracentral protrusion which could contact the intradural C6 nerve root. C6-7: Disc narrowing with endplate and uncovertebral ridging. High-grade bilateral foraminal impingement C7-T1:Mild facet spurring and uncovertebral ridging.  No impingement IMPRESSION: 1. No acute finding. 2. Discogenic degenerative foraminal impingement on the symptomatic right side at C4-5 and C6-7. There is a right paracentral protrusion at C5-6 which could contact the exiting C6 nerve root. 3. Degenerative left foraminal impingement at C6-7  primarily. 4. Mild spinal stenosis at C4-5. Electronically Signed   By: Monte Fantasia M.D.   On: 08/12/2018 04:40    Procedures Procedures (including critical care time)  Medications Ordered in ED Medications  dexamethasone (DECADRON) injection 10 mg (10 mg Intramuscular Given 08/12/18 0150)  cyclobenzaprine (FLEXERIL) tablet 5 mg (5  mg Oral Given 08/12/18 0148)  traMADol (ULTRAM) tablet 50 mg (50 mg Oral Given 08/12/18 0148)  ketorolac (TORADOL) 30 MG/ML injection 30 mg (30 mg Intramuscular Given 08/12/18 0309)  LORazepam (ATIVAN) tablet 1 mg (1 mg Oral Given 08/12/18 0332)     Initial Impression / Assessment and Plan / ED Course  I have reviewed the triage vital signs and the nursing notes.  Pertinent labs & imaging results that were available during my care of the patient were reviewed by me and considered in my medical decision making (see chart for details).  Clinical Course as of Aug 11 453  Tue Aug 12, 2018  0238 Patient with continued discomfort following pain medication.  She is continues to complain of radicular symptoms and numbness.  For this reason we will order MRI.   [CH]    Clinical Course User Index [CH] Daquawn Seelman, Barbette Hair, MD       Patient presents with neck pain ongoing for the last 2 to 3 weeks.  Reports radiculopathy and weakness which improved with prednisone.  However, pain persists.  She is overall nontoxic and vital signs are reassuring.  Objective neurologic exam is without any obvious weakness or neurologic deficit.  Initially discussed with patient pain control.  She likely needs an MRI to confirm cervical disc herniation; however, this can likely be done as an outpatient.    See clinical course above.  Patient had continued discomfort without improvement.  For this reason, MRI was ordered.  Patient was given Toradol and and tramadol which did seem to help her symptoms ultimately.  MRI does confirm a disc protrusion and likely impingement of the C6 nerve root.   Neuro exam appears stable.  Patient's pain improved.  We will have her follow-up with neurosurgery.  Patient was given an additional Medrol Dosepak for inflammation and will be given a short course of tramadol.  After history, exam, and medical workup I feel the patient has been appropriately medically screened and is safe for discharge home. Pertinent diagnoses were discussed with the patient. Patient was given return precautions.   Final Clinical Impressions(s) / ED Diagnoses   Final diagnoses:  Cervical radiculopathy  Herniated disc, cervical    ED Discharge Orders         Ordered    methylPREDNISolone (MEDROL DOSEPAK) 4 MG TBPK tablet     08/12/18 0454    traMADol (ULTRAM) 50 MG tablet  Every 6 hours PRN     08/12/18 0454           Merryl Hacker, MD 08/12/18 [phone]

## 2018-08-12 NOTE — ED Notes (Signed)
Patient transported to MRI 

## 2018-08-12 NOTE — Discharge Instructions (Addendum)
You were found to have a herniated disc on her MRI.  Take medications as prescribed.  Follow-up with neurosurgery if symptoms are not improving.  If you develop weakness, new numbness, any new or worsening symptoms you should be reevaluated.

## 2018-09-05 ENCOUNTER — Other Ambulatory Visit: Payer: Self-pay | Admitting: Cardiovascular Disease

## 2018-09-11 ENCOUNTER — Other Ambulatory Visit: Payer: Self-pay | Admitting: Physician Assistant

## 2018-10-14 ENCOUNTER — Other Ambulatory Visit: Payer: Self-pay | Admitting: Cardiology

## 2018-11-12 ENCOUNTER — Telehealth: Payer: Self-pay | Admitting: Cardiovascular Disease

## 2018-11-12 ENCOUNTER — Telehealth: Payer: Self-pay | Admitting: *Deleted

## 2018-11-12 DIAGNOSIS — I429 Cardiomyopathy, unspecified: Secondary | ICD-10-CM

## 2018-11-12 DIAGNOSIS — I272 Pulmonary hypertension, unspecified: Secondary | ICD-10-CM

## 2018-11-12 NOTE — Telephone Encounter (Signed)
Echo orders have been placed per Dr. [name]. Message sent to scheduling for an appointment.

## 2018-11-12 NOTE — Telephone Encounter (Signed)
Called patient and she is currently waiting on her Medicaid card.  She will call back once she gets her card and will schedule her echo.

## 2018-11-25 ENCOUNTER — Emergency Department (HOSPITAL_COMMUNITY)
Admission: EM | Admit: 2018-11-25 | Discharge: 2018-11-25 | Disposition: A | Discharge status: Home / Self Care | Payer: Self-pay | Attending: Emergency Medicine | Admitting: Emergency Medicine

## 2018-11-25 ENCOUNTER — Other Ambulatory Visit: Payer: Self-pay

## 2018-11-25 ENCOUNTER — Emergency Department (HOSPITAL_COMMUNITY): Payer: Self-pay

## 2018-11-25 DIAGNOSIS — J9801 Acute bronchospasm: Secondary | ICD-10-CM | POA: Insufficient documentation

## 2018-11-25 DIAGNOSIS — Z79899 Other long term (current) drug therapy: Secondary | ICD-10-CM | POA: Insufficient documentation

## 2018-11-25 DIAGNOSIS — Z20828 Contact with and (suspected) exposure to other viral communicable diseases: Secondary | ICD-10-CM | POA: Insufficient documentation

## 2018-11-25 DIAGNOSIS — I10 Essential (primary) hypertension: Secondary | ICD-10-CM | POA: Insufficient documentation

## 2018-11-25 DIAGNOSIS — R0789 Other chest pain: Secondary | ICD-10-CM | POA: Insufficient documentation

## 2018-11-25 LAB — CBC WITH DIFFERENTIAL/PLATELET
Abs Immature Granulocytes: 0 10*3/uL (ref 0.00–0.07)
Basophils Absolute: 0.1 10*3/uL (ref 0.0–0.1)
Basophils Relative: 2 %
Eosinophils Absolute: 0.1 10*3/uL (ref 0.0–0.5)
Eosinophils Relative: 4 %
HCT: 39.5 % (ref 36.0–46.0)
Hemoglobin: 12.6 g/dL (ref 12.0–15.0)
Immature Granulocytes: 0 %
Lymphocytes Relative: 43 %
Lymphs Abs: 1.4 10*3/uL (ref 0.7–4.0)
MCH: 29.7 pg (ref 26.0–34.0)
MCHC: 31.9 g/dL (ref 30.0–36.0)
MCV: 93.2 fL (ref 80.0–100.0)
Monocytes Absolute: 0.2 10*3/uL (ref 0.1–1.0)
Monocytes Relative: 7 %
Neutro Abs: 1.5 10*3/uL — ABNORMAL LOW (ref 1.7–7.7)
Neutrophils Relative %: 44 %
Platelets: 292 10*3/uL (ref 150–400)
RBC: 4.24 MIL/uL (ref 3.87–5.11)
RDW: 13.4 % (ref 11.5–15.5)
WBC: 3.3 10*3/uL — ABNORMAL LOW (ref 4.0–10.5)
nRBC: 0 % (ref 0.0–0.2)

## 2018-11-25 LAB — COMPREHENSIVE METABOLIC PANEL
ALT: 20 U/L (ref 0–44)
AST: 25 U/L (ref 15–41)
Albumin: 3.7 g/dL (ref 3.5–5.0)
Alkaline Phosphatase: 62 U/L (ref 38–126)
Anion gap: 10 (ref 5–15)
BUN: 9 mg/dL (ref 6–20)
CO2: 27 mmol/L (ref 22–32)
Calcium: 9.6 mg/dL (ref 8.9–10.3)
Chloride: 103 mmol/L (ref 98–111)
Creatinine, Ser: 0.86 mg/dL (ref 0.44–1.00)
GFR calc Af Amer: >60 mL/min (ref >60)
GFR calc non Af Amer: >60 mL/min (ref >60)
Glucose, Bld: 102 mg/dL — ABNORMAL HIGH (ref 70–99)
Potassium: 3.4 mmol/L — ABNORMAL LOW (ref 3.5–5.1)
Sodium: 140 mmol/L (ref 135–145)
Total Bilirubin: 0.5 mg/dL (ref 0.3–1.2)
Total Protein: 7.5 g/dL (ref 6.5–8.1)

## 2018-11-25 LAB — D-DIMER, QUANTITATIVE: D-Dimer, Quant: 0.39 ug/mL-FEU (ref 0.00–0.50)

## 2018-11-25 LAB — TROPONIN I (HIGH SENSITIVITY)
Troponin I (High Sensitivity): 4 ng/L (ref <18)
Troponin I (High Sensitivity): 3 ng/L (ref <18)

## 2018-11-25 LAB — SARS CORONAVIRUS 2 BY RT PCR (HOSPITAL ORDER, PERFORMED IN ~~LOC~~ HOSPITAL LAB): SARS Coronavirus 2: NEGATIVE

## 2018-11-25 MED ORDER — ALBUTEROL SULFATE HFA 108 (90 BASE) MCG/ACT IN AERS
1.0000 | INHALATION_SPRAY | RESPIRATORY_TRACT | Status: DC | PRN
  Administered 2018-11-25: 2 via RESPIRATORY_TRACT
  Filled 2018-11-25: qty 6.7

## 2018-11-25 MED ORDER — METHYLPREDNISOLONE SODIUM SUCC 125 MG IJ SOLR
125.0000 mg | Freq: Once | INTRAMUSCULAR | Status: AC
  Administered 2018-11-25: 125 mg via INTRAVENOUS
  Filled 2018-11-25: qty 2

## 2018-11-25 MED ORDER — PREDNISONE 20 MG PO TABS: 60.0000 mg | ORAL_TABLET | Freq: Every day | ORAL | 0 refills | Status: DC

## 2018-11-25 MED ORDER — KETOROLAC TROMETHAMINE 30 MG/ML IJ SOLN
30.0000 mg | Freq: Once | INTRAMUSCULAR | Status: AC
  Administered 2018-11-25: 30 mg via INTRAVENOUS
  Filled 2018-11-25: qty 1

## 2018-11-25 MED ORDER — ALBUTEROL SULFATE HFA 108 (90 BASE) MCG/ACT IN AERS: 1.0000 | INHALATION_SPRAY | Freq: Four times a day (QID) | RESPIRATORY_TRACT | 0 refills | Status: DC | PRN

## 2018-11-25 MED ORDER — LORAZEPAM 2 MG/ML IJ SOLN
0.5000 mg | Freq: Once | INTRAMUSCULAR | Status: AC
  Administered 2018-11-25: 0.5 mg via INTRAVENOUS
  Filled 2018-11-25: qty 1

## 2018-11-25 NOTE — ED Provider Notes (Addendum)
[facility] EMERGENCY DEPARTMENT Provider Note   CSN: [id number] Arrival date & time: 11/25/18  1117    History   Chief Complaint Chief Complaint  Patient presents with  . Chest Pain    HPI Gail Schmitt is a 58 y.o. female.     HPI Patient states she is had 3 weeks of central chest pain that is nonradiating.  She has associated shortness of breath and cough.  Cough is nonproductive.  Denies fever but has had chills.  States she is seen her primary doctor, rheumatologist and has echocardiogram scheduled with her cardiologist.  Patient states that the pain worsened this morning.   Past Medical History:  Diagnosis Date  . Arthritis    "left hip; hands" (07/28/2014)  . Breast cancer, left breast ([place]) 1999   a. s/p left mastectomy  . Dermatomyositis ([place])    a. noted prior to diagnosis of Breast CA ("i'm allergic to cancer")  . 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 Family history of adverse reaction to anesthesia    "all my daughters get real sick and throw up alot"  . Fatty liver   . Headache    "maybe monthly" (07/28/2014)  . Heart murmur   . Hypertension   . Mitral valve prolapse   . Normal coronary arteries    last cath 2016  . Obesity   . Pneumonia    "haven't had it in the last 4 yrs; before that I had it q yr for 4-5 years" (07/28/2014)  . Pulmonary embolism ([place]) ~ 2003  . RBBB    with symptomatic bradycardia  . Tubular adenoma of colon   . Unstable angina ([place])    a. Cath ~ 10 + yrs ago, [facility], [place] - "normal but pressures were high."  . Uterine cancer [facility]) 2007    Patient Active Problem List   Diagnosis Date Noted  . False positive stress test 04/19/2018  . Beta-blockers contraindicated due to bradycardia 01/09/2018  . Normal coronary arteries 01/09/2018  . History of breast cancer 01/09/2018  . History of pulmonary embolus (PE) 01/09/2018  . Dermatomyositis ([place])   . Symptomatic cholelithiasis  07/06/2014  . Vitamin D deficiency 06/23/2014  . Fatty infiltration of liver 06/23/2014  . Obesity   . Chest pain 07/12/2011  . Essential hypertension 07/12/2011    Past Surgical History:  Procedure Laterality Date  . BREAST BIOPSY Left 1999  . CARDIAC CATHETERIZATION  07/13/2011  . CHOLECYSTECTOMY N/A 07/31/2014   Procedure: LAPAROSCOPIC CHOLECYSTECTOMY WITH INTRAOPERATIVE CHOLANGIOGRAM;  Surgeon: Erroll Luna, MD;  Location: [facility];  Service: General;  Laterality: N/A;  . INGUINAL HERNIA REPAIR Left 1986  . LAPAROSCOPIC TOTAL HYSTERECTOMY    . LEFT HEART CATHETERIZATION WITH CORONARY ANGIOGRAM N/A 07/13/2011   Procedure: LEFT HEART CATHETERIZATION WITH CORONARY ANGIOGRAM;  Surgeon: Josue Hector, MD;  Location: [facility] C[facility] LAB;  Service: Cardiovascular;  Laterality: N/A;  . LEFT HEART CATHETERIZATION WITH CORONARY ANGIOGRAM N/A 07/30/2014   Procedure: LEFT HEART CATHETERIZATION WITH CORONARY ANGIOGRAM;  Surgeon: [name], MD;  Location: [facility] CATH LAB;  Service: Cardiovascular;  Laterality: N/A;  . MASTECTOMY Left 1999  . RECONSTRUCTION BREAST IMMEDIATE / DELAYED W/ TISSUE EXPANDER Left 2005  . TOTAL ABDOMINAL HYSTERECTOMY  2007  . TUBAL LIGATION  1990     OB History   No obstetric history on file.      Home Medications    Prior to Admission medications   Medication Sig  Start Date End Date Taking? Authorizing Provider  amLODipine (NORVASC) 5 MG tablet Take 1 tablet by mouth once daily 10/14/18  Yes Croitoru, Mihai, MD  cyclobenzaprine (FLEXERIL) 10 MG tablet Take 10 mg by mouth every 8 (eight) hours. 06/04/18  Yes [provider]  folic acid (FOLVITE) 1 MG tablet Take 1 mg by mouth daily.   Yes [provider]  hydrochlorothiazide (HYDRODIURIL) 25 MG tablet Take 1 tablet by mouth once daily Patient taking differently: Take 25 mg by mouth.  09/11/18  Yes Croitoru, Mihai, MD  hydrOXYzine (ATARAX/VISTARIL) 25 MG tablet Take 25 mg by mouth at bedtime.  07/31/18  Yes  [provider]  lisinopril (ZESTRIL) 40 MG tablet TAKE ONE TABLET BY MOUTH DAILY Patient taking differently: Take 40 mg by mouth daily.  09/05/18  Yes Croitoru, Mihai, MD  loperamide (IMODIUM) 2 MG capsule Take 2 mg by mouth as needed for diarrhea or loose stools.   Yes [provider]  loratadine (CLARITIN) 10 MG tablet Take 10 mg by mouth daily.   Yes [provider]  methotrexate (RHEUMATREX) 2.5 MG tablet Take 10 mg by mouth 2 (two) times a week. On Friday and Saturday 03/04/18  Yes [provider]  nitroGLYCERIN (NITROSTAT) 0.4 MG SL tablet Place 1 tablet (0.4 mg total) under the tongue every 5 (five) minutes as needed for chest pain. 12/21/17 12/21/18 Yes Eileen Stanford, PA-C  albuterol (VENTOLIN HFA) 108 (90 Base) MCG/ACT inhaler Inhale 1-2 puffs into the lungs every 6 (six) hours as needed for wheezing or shortness of breath. 11/25/18   Hayden Rasmussen, MD  methylPREDNISolone (MEDROL DOSEPAK) 4 MG TBPK tablet Take as directed on packet Patient not taking: Reported on 11/25/2018 08/12/18   Horton, Barbette Hair, MD  Multiple Vitamin (MULTIVITAMIN WITH MINERALS) TABS tablet Take 1 tablet by mouth daily.    [provider]  predniSONE (DELTASONE) 20 MG tablet Take 3 tablets (60 mg total) by mouth daily. 11/25/18   Hayden Rasmussen, MD  traMADol (ULTRAM) 50 MG tablet Take 1 tablet (50 mg total) by mouth every 6 (six) hours as needed. Patient not taking: Reported on 11/25/2018 08/12/18   Horton, Barbette Hair, MD    Family History Family History  Problem Relation Age of Onset  . Diabetes type II Mother   . Colon polyps Mother   . Other Mother        cholangiocarcinoma  . Diabetes Sister   . Stroke Brother   . Colon cancer Neg Hx   . Breast cancer Neg Hx     Social History Social History   Tobacco Use  . Smoking status: Never Smoker  . Smokeless tobacco: Never Used  Substance Use Topics  . Alcohol use: Not Currently    Alcohol/week: 1.0  standard drinks    Types: 1 Standard drinks or equivalent per week    Comment: 07/28/2014 "might have 1 drink a couple times/month"  . Drug use: No     Allergies   Meloxicam and Percocet [oxycodone-acetaminophen]   Review of Systems Review of Systems  Constitutional: Positive for chills. Negative for fever.  HENT: Negative for sore throat and trouble swallowing.   Eyes: Negative for visual disturbance.  Respiratory: Positive for cough and shortness of breath.   Cardiovascular: Positive for chest pain. Negative for palpitations and leg swelling.  Gastrointestinal: Negative for abdominal pain, constipation, diarrhea, nausea and vomiting.  Musculoskeletal: Negative for back pain, myalgias and neck pain.  Skin: Negative for rash  and wound.  Neurological: Negative for dizziness, weakness, light-headedness, numbness and headaches.  All other systems reviewed and are negative.    Physical Exam Updated Vital Signs BP 127/86   Pulse 69   Temp 97.9 F (36.6 C) (Oral)   Resp (!) 21   Ht 5\' 5"  (1.651 m)   Wt 127.9 kg   SpO2 98%   BMI 46.93 kg/m   Physical Exam Vitals signs and nursing note reviewed.  Constitutional:      Appearance: She is well-developed.     Comments: Tearful.  HENT:     Head: Normocephalic and atraumatic.     Nose: Nose normal.     Mouth/Throat:     Mouth: Mucous membranes are moist.  Eyes:     Pupils: Pupils are equal, round, and reactive to light.  Neck:     Musculoskeletal: Normal range of motion and neck supple. No neck rigidity or muscular tenderness.  Cardiovascular:     Rate and Rhythm: Normal rate and regular rhythm.     Heart sounds: No murmur. No friction rub. No gallop.   Pulmonary:     Effort: Pulmonary effort is normal. No respiratory distress.     Breath sounds: No stridor. Wheezing present. No rhonchi or rales.     Comments: Few scattered expiratory wheezes.  Mild inferior sternal tenderness to palpation.  No crepitance or deformity.  Chest:     Chest wall: Tenderness present.  Abdominal:     General: Bowel sounds are normal.     Palpations: Abdomen is soft.     Tenderness: There is no abdominal tenderness. There is no right CVA tenderness, left CVA tenderness, guarding or rebound.  Musculoskeletal: Normal range of motion.        General: No swelling, tenderness, deformity or signs of injury.     Right lower leg: No edema.     Left lower leg: No edema.  Lymphadenopathy:     Cervical: No cervical adenopathy.  Skin:    General: Skin is warm and dry.     Capillary Refill: Capillary refill takes less than 2 seconds.     Findings: No erythema or rash.  Neurological:     General: No focal deficit present.     Mental Status: She is alert and oriented to person, place, and time.  Psychiatric:        Behavior: Behavior normal.      ED Treatments / Results  Labs (all labs ordered are listed, but only abnormal results are displayed) Labs Reviewed  CBC WITH DIFFERENTIAL/PLATELET - Abnormal; Notable for the following components:      Result Value   WBC 3.3 (*)    Neutro Abs 1.5 (*)    All other components within normal limits  COMPREHENSIVE METABOLIC PANEL - Abnormal; Notable for the following components:   Potassium 3.4 (*)    Glucose, Bld 102 (*)    All other components within normal limits  SARS CORONAVIRUS 2 (HOSPITAL ORDER, [place] LAB)  D-DIMER, QUANTITATIVE (NOT AT [facility])  TROPONIN I (HIGH SENSITIVITY)  TROPONIN I (HIGH SENSITIVITY)    EKG EKG Interpretation  Date/Time:  Tuesday November 25 2018 11:28:16 EDT Ventricular Rate:  88 PR Interval:    QRS Duration: 95 QT Interval:  370 QTC Calculation: 448 R Axis:   26 Text Interpretation:  Sinus rhythm Nonspecific T abnormalities, lateral leads Confirmed by Julianne Rice [phone]) on 11/25/2018 12:07:37 PM   Radiology No results found.  Procedures  Procedures (including critical care time)  Medications Ordered in ED  Medications  ketorolac (TORADOL) 30 MG/ML injection 30 mg (30 mg Intravenous Given 11/25/18 1354)  LORazepam (ATIVAN) injection 0.5 mg (0.5 mg Intravenous Given 11/25/18 1354)  methylPREDNISolone sodium succinate (SOLU-MEDROL) 125 mg/2 mL injection 125 mg (125 mg Intravenous Given 11/25/18 1508)     Initial Impression / Assessment and Plan / ED Course  I have reviewed the triage vital signs and the nursing notes.  Pertinent labs & imaging results that were available during my care of the patient were reviewed by me and considered in my medical decision making (see chart for details).  Clinical Course as of Nov 28 2132  Tue Nov 25, 2018  1540 Patient's delta troponin is essentially unchanged.   [MB]    Clinical Course User Index [MB] Hayden Rasmussen, MD      Patient states she is feeling better after Ativan and Toradol.  D-dimer is normal.  Initial troponin is normal as well.  EKG without acute findings.  Suspect bronchospasm versus pleurisy versus chest wall origin for the patient's pain.  She does have follow-up with cardiology.  Signed out to oncoming emergency provider. Final Clinical Impressions(s) / ED Diagnoses   Final diagnoses:  Atypical chest pain  Bronchospasm    ED Discharge Orders         Ordered    albuterol (VENTOLIN HFA) 108 (90 Base) MCG/ACT inhaler  Every 6 hours PRN     11/25/18 1550    predniSONE (DELTASONE) 20 MG tablet  Daily     11/25/18 1550           Julianne Rice, MD 11/25/18 1444    Julianne Rice, MD 11/29/18 2134

## 2018-11-25 NOTE — ED Notes (Signed)
Patient Alert and oriented to baseline. Stable and ambulatory to baseline. Patient verbalized understanding of the discharge instructions.  Patient belongings were taken by the patient.   

## 2018-11-25 NOTE — Discharge Instructions (Addendum)
You were seen in the emergency department for evaluation of chest pain and shortness of breath.  You had blood work EKG and chest x-ray that did not show a serious cause of your symptoms.  We are sending you home with prescriptions for an inhaler and 4 more days of steroids.  Please follow-up with your doctor for further evaluation and return if any worsening symptoms.

## 2018-11-25 NOTE — ED Notes (Signed)
Ambulated to bathroom, c/o chest pain and shortness of breath while ambulating

## 2018-11-25 NOTE — ED Triage Notes (Addendum)
Intermittent chest pain for several days. States it was hurting last night, took one nitro and pain decreased. This morning around 0830 the pain woke her up. Took another nitro around 1030 this morning. Had some relief but the pain is severe with any activity, also c/o shortness of breath and dizziness. Scheduled for an echocardiogram Thursday.

## 2018-11-25 NOTE — ED Provider Notes (Signed)
Signout from Dr. Lita Mains.  58 year old female with on and off chest pain and shortness of breath cough. Physical Exam  BP 127/86   Pulse 69   Temp 97.9 F (36.6 C) (Oral)   Resp (!) 21   Ht 5\' 5"  (1.651 m)   Wt 127.9 kg   SpO2 98%   BMI 46.93 kg/m   Physical Exam  ED Course/Procedures   Clinical Course as of Nov 25 1532  Tue Nov 25, 2018  1540 Patient's delta troponin is essentially unchanged.   [MB]    Clinical Course User Index [MB] Hayden Rasmussen, MD    Procedures  MDM  Plan is to follow-up on delta Trope.  If not significantly changed can be discharged on a course of steroids and inhaler.       Hayden Rasmussen, MD 11/26/18 1534

## 2018-11-27 ENCOUNTER — Other Ambulatory Visit: Payer: Self-pay

## 2018-11-27 ENCOUNTER — Ambulatory Visit (HOSPITAL_COMMUNITY): Payer: Self-pay | Attending: Cardiology

## 2018-11-27 DIAGNOSIS — I272 Pulmonary hypertension, unspecified: Secondary | ICD-10-CM | POA: Diagnosis present

## 2018-11-27 DIAGNOSIS — I429 Cardiomyopathy, unspecified: Secondary | ICD-10-CM | POA: Insufficient documentation

## 2018-11-27 MED ORDER — PERFLUTREN LIPID MICROSPHERE
1.0000 mL | INTRAVENOUS | Status: AC | PRN
  Administered 2018-11-27: 2 mL via INTRAVENOUS

## 2018-12-08 ENCOUNTER — Other Ambulatory Visit: Payer: Self-pay

## 2018-12-08 ENCOUNTER — Encounter (HOSPITAL_COMMUNITY): Payer: Self-pay | Admitting: *Deleted

## 2018-12-08 ENCOUNTER — Emergency Department (HOSPITAL_COMMUNITY): Payer: Medicaid Other

## 2018-12-08 ENCOUNTER — Emergency Department (HOSPITAL_COMMUNITY)
Admission: EM | Admit: 2018-12-08 | Discharge: 2018-12-08 | Disposition: A | Discharge status: Home / Self Care | Payer: Medicaid Other | Attending: Emergency Medicine | Admitting: Emergency Medicine

## 2018-12-08 DIAGNOSIS — Z79899 Other long term (current) drug therapy: Secondary | ICD-10-CM | POA: Diagnosis not present

## 2018-12-08 DIAGNOSIS — R079 Chest pain, unspecified: Secondary | ICD-10-CM | POA: Diagnosis present

## 2018-12-08 DIAGNOSIS — I10 Essential (primary) hypertension: Secondary | ICD-10-CM | POA: Diagnosis not present

## 2018-12-08 DIAGNOSIS — R072 Precordial pain: Secondary | ICD-10-CM | POA: Insufficient documentation

## 2018-12-08 LAB — BASIC METABOLIC PANEL
Anion gap: 13 (ref 5–15)
BUN: 20 mg/dL (ref 6–20)
CO2: 25 mmol/L (ref 22–32)
Calcium: 9.4 mg/dL (ref 8.9–10.3)
Chloride: 98 mmol/L (ref 98–111)
Creatinine, Ser: 0.81 mg/dL (ref 0.44–1.00)
GFR calc Af Amer: >60 mL/min (ref >60)
GFR calc non Af Amer: >60 mL/min (ref >60)
Glucose, Bld: 126 mg/dL — ABNORMAL HIGH (ref 70–99)
Potassium: 4.1 mmol/L (ref 3.5–5.1)
Sodium: 136 mmol/L (ref 135–145)

## 2018-12-08 LAB — CBC
HCT: 40.9 % (ref 36.0–46.0)
Hemoglobin: 13.2 g/dL (ref 12.0–15.0)
MCH: 29.9 pg (ref 26.0–34.0)
MCHC: 32.3 g/dL (ref 30.0–36.0)
MCV: 92.5 fL (ref 80.0–100.0)
Platelets: 272 10*3/uL (ref 150–400)
RBC: 4.42 MIL/uL (ref 3.87–5.11)
RDW: 14.9 % (ref 11.5–15.5)
WBC: 10.3 10*3/uL (ref 4.0–10.5)
nRBC: 0 % (ref 0.0–0.2)

## 2018-12-08 LAB — TROPONIN I (HIGH SENSITIVITY): Troponin I (High Sensitivity): 4 ng/L (ref <18)

## 2018-12-08 MED ORDER — COLCHICINE 0.6 MG PO TABS: 0.6000 mg | ORAL_TABLET | Freq: Every day | ORAL | 0 refills | Status: DC

## 2018-12-08 MED ORDER — LIDOCAINE VISCOUS HCL 2 % MT SOLN
15.0000 mL | Freq: Once | OROMUCOSAL | Status: AC
  Administered 2018-12-08: 05:00:00 15 mL via ORAL
  Filled 2018-12-08: qty 15

## 2018-12-08 MED ORDER — LIDOCAINE 5 % EX PTCH: 1.0000 | MEDICATED_PATCH | CUTANEOUS | 0 refills | Status: AC

## 2018-12-08 MED ORDER — IOHEXOL 350 MG/ML SOLN
75.0000 mL | Freq: Once | INTRAVENOUS | Status: AC | PRN
  Administered 2018-12-08: 75 mL via INTRAVENOUS

## 2018-12-08 MED ORDER — KETOROLAC TROMETHAMINE 15 MG/ML IJ SOLN
15.0000 mg | Freq: Once | INTRAMUSCULAR | Status: AC
  Administered 2018-12-08: 05:00:00 15 mg via INTRAVENOUS
  Filled 2018-12-08: qty 1

## 2018-12-08 MED ORDER — ALUM & MAG HYDROXIDE-SIMETH 200-200-20 MG/5ML PO SUSP
30.0000 mL | Freq: Once | ORAL | Status: AC
  Administered 2018-12-08: 05:00:00 30 mL via ORAL
  Filled 2018-12-08: qty 30

## 2018-12-08 MED ORDER — SODIUM CHLORIDE 0.9% FLUSH: 10.0000 mL | INTRAVENOUS | Status: DC | PRN

## 2018-12-08 MED ORDER — OMEPRAZOLE 20 MG PO CPDR: 20.0000 mg | DELAYED_RELEASE_CAPSULE | Freq: Every day | ORAL | 0 refills | Status: AC

## 2018-12-08 MED ORDER — SODIUM CHLORIDE 0.9% FLUSH
3.0000 mL | Freq: Once | INTRAVENOUS | Status: AC
  Administered 2018-12-08: 3 mL via INTRAVENOUS

## 2018-12-08 NOTE — ED Notes (Signed)
Patient verbalizes understanding of discharge instructions. Opportunity for questioning and answers were provided. Armband removed by staff, pt discharged from ED home via POV.  

## 2018-12-08 NOTE — ED Triage Notes (Signed)
Pt having increased sob tonight, unable to lie flat or get into a comfortable position. C/o centralized chest pain x 3 weeks. Has been seen by PCP, given inhaler, steriods. Has been instructed to use her inhaler more often, denies relief with use. Recent Covid negative. Reports barking, nonproductive cough

## 2018-12-08 NOTE — ED Provider Notes (Signed)
I have personally evaluated the patient, she has dermatomyositis, she has had some issues with some intermittent chronic pain of different locations over the last 6 months or so and unfortunately has been on multiple different courses of steroids.  Currently taking a steroid as well as Neurontin.  She reports having a pleuritic sharp type pain in the left side to the mid chest that goes to her back.  Thankfully the CT scan is negative for pulmonary embolism, there is no signs of acute coronary syndrome and the patient otherwise appears well.  She will be treated with less than 2 weeks of daily colchicine as a potential anti-inflammatory to help with anything that may be related to either pericarditis or pleurisy, she did not tolerate meloxicam very well.  She has follow-up with her rheumatologist.  She is stable for discharge   Noemi Chapel, MD 12/08/18 [phone]

## 2018-12-08 NOTE — ED Notes (Signed)
Patient transported to CT 

## 2018-12-08 NOTE — ED Provider Notes (Signed)
[facility] EMERGENCY DEPARTMENT Provider Note   CSN: [id number] Arrival date & time: 12/08/18  0308     History   Chief Complaint Chief Complaint  Patient presents with  . Chest Pain    HPI Gail Schmitt is a 58 y.o. female.     The history is provided by the patient.  Chest Pain Pain location:  Substernal area Pain quality: sharp   Pain severity:  Moderate Onset quality:  Gradual Duration:  4 weeks Timing:  Constant Progression:  Worsening Chronicity:  Chronic Context: movement   Context: not breathing   Context comment:  Laying flat Relieved by:  Nothing Worsened by:  Nothing Ineffective treatments: steroids and NSAIDs. Associated symptoms: no abdominal pain, no AICD problem, no altered mental status, no anorexia, no anxiety, no back pain, no dizziness, no fatigue, no fever, no lower extremity edema, no nausea, no numbness, no orthopnea, no palpitations, no PND and no shortness of breath   Risk factors: obesity   Patient has been seen in the ED and by cardiology and her PMD for this ongoing pain and has been prescribed NSAIDs, multiple courses of steroids, neurontin and inhalers without relief.  Her cardiologist told her the pain was not cardiac in nature. There are no exertional symptoms.  No f/c/r.  No DOE, no SOB, no n/v/d.  Recent negative covid swab but complains of loss of taste.    Past Medical History:  Diagnosis Date  . Arthritis    "left hip; hands" (07/28/2014)  . Breast cancer, left breast ([place]) 1999   a. s/p left mastectomy  . Dermatomyositis ([place])    a. noted prior to diagnosis of Breast CA ("i'm allergic to cancer")  . 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 Family history of adverse reaction to anesthesia    "all my daughters get real sick and throw up alot"  . Fatty liver   . Headache    "maybe monthly" (07/28/2014)  . Heart murmur   . Hypertension   . Mitral valve prolapse   . Normal coronary arteries     last cath 2016  . Obesity   . Pneumonia    "haven't had it in the last 4 yrs; before that I had it q yr for 4-5 years" (07/28/2014)  . Pulmonary embolism ([place]) ~ 2003  . RBBB    with symptomatic bradycardia  . Tubular adenoma of colon   . Unstable angina ([place])    a. Cath ~ 10 + yrs ago, [facility], [place] - "normal but pressures were high."  . Uterine cancer [facility]) 2007    Patient Active Problem List   Diagnosis Date Noted  . False positive stress test 04/19/2018  . Beta-blockers contraindicated due to bradycardia 01/09/2018  . Normal coronary arteries 01/09/2018  . History of breast cancer 01/09/2018  . History of pulmonary embolus (PE) 01/09/2018  . Dermatomyositis ([place])   . Symptomatic cholelithiasis 07/06/2014  . Vitamin D deficiency 06/23/2014  . Fatty infiltration of liver 06/23/2014  . Obesity   . Chest pain 07/12/2011  . Essential hypertension 07/12/2011    Past Surgical History:  Procedure Laterality Date  . BREAST BIOPSY Left 1999  . CARDIAC CATHETERIZATION  07/13/2011  . CHOLECYSTECTOMY N/A 07/31/2014   Procedure: LAPAROSCOPIC CHOLECYSTECTOMY WITH INTRAOPERATIVE CHOLANGIOGRAM;  Surgeon: Erroll Luna, MD;  Location: [facility];  Service: General;  Laterality: N/A;  . INGUINAL HERNIA REPAIR Left 1986  . LAPAROSCOPIC TOTAL HYSTERECTOMY    . LEFT  HEART CATHETERIZATION WITH CORONARY ANGIOGRAM N/A 07/13/2011   Procedure: LEFT HEART CATHETERIZATION WITH CORONARY ANGIOGRAM;  Surgeon: Josue Hector, MD;  Location: [facility] CATH LAB;  Service: Cardiovascular;  Laterality: N/A;  . LEFT HEART CATHETERIZATION WITH CORONARY ANGIOGRAM N/A 07/30/2014   Procedure: LEFT HEART CATHETERIZATION WITH CORONARY ANGIOGRAM;  Surgeon: [name], MD;  Location: [facility] CATH LAB;  Service: Cardiovascular;  Laterality: N/A;  . MASTECTOMY Left 1999  . RECONSTRUCTION BREAST IMMEDIATE / DELAYED W/ TISSUE EXPANDER Left 2005  . TOTAL ABDOMINAL HYSTERECTOMY  2007  . TUBAL LIGATION  1990     OB History    No obstetric history on file.      Home Medications    Prior to Admission medications   Medication Sig Start Date End Date Taking? Authorizing Provider  albuterol (VENTOLIN HFA) 108 (90 Base) MCG/ACT inhaler Inhale 1-2 puffs into the lungs every 6 (six) hours as needed for wheezing or shortness of breath. 11/25/18   Hayden Rasmussen, MD  amLODipine (NORVASC) 5 MG tablet Take 1 tablet by mouth once daily 10/14/18   Croitoru, Mihai, MD  cyclobenzaprine (FLEXERIL) 10 MG tablet Take 10 mg by mouth every 8 (eight) hours. 06/04/18   [provider]  folic acid (FOLVITE) 1 MG tablet Take 1 mg by mouth daily.    [provider]  hydrochlorothiazide (HYDRODIURIL) 25 MG tablet Take 1 tablet by mouth once daily Patient taking differently: Take 25 mg by mouth.  09/11/18   Croitoru, Mihai, MD  hydrOXYzine (ATARAX/VISTARIL) 25 MG tablet Take 25 mg by mouth at bedtime.  07/31/18   [provider]  lisinopril (ZESTRIL) 40 MG tablet TAKE ONE TABLET BY MOUTH DAILY Patient taking differently: Take 40 mg by mouth daily.  09/05/18   Croitoru, Mihai, MD  loperamide (IMODIUM) 2 MG capsule Take 2 mg by mouth as needed for diarrhea or loose stools.    [provider]  loratadine (CLARITIN) 10 MG tablet Take 10 mg by mouth daily.    [provider]  methotrexate (RHEUMATREX) 2.5 MG tablet Take 10 mg by mouth 2 (two) times a week. On Friday and Saturday 03/04/18   [provider]  methylPREDNISolone (MEDROL DOSEPAK) 4 MG TBPK tablet Take as directed on packet Patient not taking: Reported on 11/25/2018 08/12/18   Horton, Barbette Hair, MD  Multiple Vitamin (MULTIVITAMIN WITH MINERALS) TABS tablet Take 1 tablet by mouth daily.    [provider]  nitroGLYCERIN (NITROSTAT) 0.4 MG SL tablet Place 1 tablet (0.4 mg total) under the tongue every 5 (five) minutes as needed for chest pain. 12/21/17 12/21/18  Eileen Stanford, PA-C  predniSONE (DELTASONE) 20 MG tablet Take 3  tablets (60 mg total) by mouth daily. 11/25/18   Hayden Rasmussen, MD  traMADol (ULTRAM) 50 MG tablet Take 1 tablet (50 mg total) by mouth every 6 (six) hours as needed. Patient not taking: Reported on 11/25/2018 08/12/18   Horton, Barbette Hair, MD    Family History Family History  Problem Relation Age of Onset  . Diabetes type II Mother   . Colon polyps Mother   . Other Mother        cholangiocarcinoma  . Diabetes Sister   . Stroke Brother   . Colon cancer Neg Hx   . Breast cancer Neg Hx     Social History Social History   Tobacco Use  . Smoking status: Never Smoker  . Smokeless tobacco: Never Used  Substance Use Topics  .  Alcohol use: Not Currently    Alcohol/week: 1.0 standard drinks    Types: 1 Standard drinks or equivalent per week    Comment: 07/28/2014 "might have 1 drink a couple times/month"  . Drug use: No     Allergies   Meloxicam and Percocet [oxycodone-acetaminophen]   Review of Systems Review of Systems  Constitutional: Negative for appetite change, fatigue and fever.  Respiratory: Negative for shortness of breath.   Cardiovascular: Positive for chest pain. Negative for palpitations, orthopnea and PND.  Gastrointestinal: Negative for abdominal pain, anorexia and nausea.  Genitourinary: Negative for flank pain.  Musculoskeletal: Negative for back pain and neck pain.  Neurological: Negative for dizziness and numbness.  Psychiatric/Behavioral: Negative for agitation.  All other systems reviewed and are negative.    Physical Exam Updated Vital Signs BP (!) 141/94   Pulse 76   Temp 97.9 F (36.6 C) (Oral)   Resp 16   SpO2 98%   Physical Exam Vitals signs and nursing note reviewed.  Constitutional:      Appearance: She is obese. She is not ill-appearing.  HENT:     Head: Normocephalic and atraumatic.     Nose: Nose normal.  Eyes:     Conjunctiva/sclera: Conjunctivae normal.     Pupils: Pupils are equal, round, and reactive to light.  Neck:      Musculoskeletal: Normal range of motion and neck supple.  Cardiovascular:     Rate and Rhythm: Normal rate and regular rhythm.     Pulses: Normal pulses.     Heart sounds: Normal heart sounds.  Pulmonary:     Effort: Pulmonary effort is normal.     Breath sounds: Normal breath sounds.  Abdominal:     General: Abdomen is flat. There is no distension.     Tenderness: There is no abdominal tenderness. There is no rebound.     Comments: Gassy   Musculoskeletal: Normal range of motion.        General: No swelling.     Right lower leg: No edema.     Left lower leg: No edema.  Skin:    General: Skin is warm and dry.     Capillary Refill: Capillary refill takes less than 2 seconds.  Neurological:     General: No focal deficit present.     Mental Status: She is alert and oriented to person, place, and time.  Psychiatric:        Mood and Affect: Mood normal.        Behavior: Behavior normal.      ED Treatments / Results  Labs (all labs ordered are listed, but only abnormal results are displayed) Results for orders placed or performed during the hospital encounter of 29/51/88  Basic metabolic panel  Result Value Ref Range   Sodium 136 135 - 145 mmol/L   Potassium 4.1 3.5 - 5.1 mmol/L   Chloride 98 98 - 111 mmol/L   CO2 25 22 - 32 mmol/L   Glucose, Bld 126 (H) 70 - 99 mg/dL   BUN 20 6 - 20 mg/dL   Creatinine, Ser 0.81 0.44 - 1.00 mg/dL   Calcium 9.4 8.9 - 10.3 mg/dL   GFR calc non Af Amer >60 >60 mL/min   GFR calc Af Amer >60 >60 mL/min   Anion gap 13 5 - 15  CBC  Result Value Ref Range   WBC 10.3 4.0 - 10.5 K/uL   RBC 4.42 3.87 - 5.11 MIL/uL   Hemoglobin 13.2 12.0 -  15.0 g/dL   HCT 40.9 36.0 - 46.0 %   MCV 92.5 80.0 - 100.0 fL   MCH 29.9 26.0 - 34.0 pg   MCHC 32.3 30.0 - 36.0 g/dL   RDW 14.9 11.5 - 15.5 %   Platelets 272 150 - 400 K/uL   nRBC 0.0 0.0 - 0.2 %  Troponin I (High Sensitivity)  Result Value Ref Range   Troponin I (High Sensitivity) 4 <18 ng/L   Dg Chest  2 View  Result Date: 12/08/2018 CLINICAL DATA:  Increasing shortness of breath EXAM: CHEST - 2 VIEW COMPARISON:  11/25/2018 FINDINGS: The heart size and mediastinal contours are within normal limits. Both lungs are clear. The visualized skeletal structures are unremarkable. IMPRESSION: No active cardiopulmonary disease. Electronically Signed   By: Inez Catalina M.D.   On: 12/08/2018 03:47   Dg Chest 2 View  Result Date: 11/25/2018 CLINICAL DATA:  Chest pain and shortness of breath for several weeks worse today, history pneumonia, pulmonary embolism, hypertension, LEFT breast cancer EXAM: CHEST - 2 VIEW COMPARISON:  A 12/19/2017 FINDINGS: Normal heart size, mediastinal contours, and pulmonary vascularity. Lungs clear. Asymmetric density at inferior LEFT hemithorax appears due to superimposed breast prosthesis. No pleural effusion or pneumothorax. Bones demineralized. IMPRESSION: No acute abnormalities. Electronically Signed   By: Lavonia Dana M.D.   On: 11/25/2018 13:21    EKG EKG Interpretation  Date/Time:  Monday December 08 2018 03:14:51 EDT Ventricular Rate:  85 PR Interval:  112 QRS Duration: 82 QT Interval:  370 QTC Calculation: 440 R Axis:   52 Text Interpretation:  Normal sinus rhythm   Reconfirmed by Randal Buba, Gian Ybarra ([id number]) on 12/08/2018 4:53:55 AM   Radiology Dg Chest 2 View  Result Date: 12/08/2018 CLINICAL DATA:  Increasing shortness of breath EXAM: CHEST - 2 VIEW COMPARISON:  11/25/2018 FINDINGS: The heart size and mediastinal contours are within normal limits. Both lungs are clear. The visualized skeletal structures are unremarkable. IMPRESSION: No active cardiopulmonary disease. Electronically Signed   By: Inez Catalina M.D.   On: 12/08/2018 03:47    Procedures Procedures (including critical care time)  Medications Ordered in ED Medications  sodium chloride flush (NS) 0.9 % injection 3 mL (3 mLs Intravenous Given 12/08/18 0441)  ketorolac (TORADOL) 15 MG/ML injection 15 mg (15 mg  Intravenous Given 12/08/18 0441)  alum & mag hydroxide-simeth (MAALOX/MYLANTA) 200-200-20 MG/5ML suspension 30 mL (30 mLs Oral Given 12/08/18 0441)    And  lidocaine (XYLOCAINE) 2 % viscous mouth solution 15 mL (15 mLs Oral Given 12/08/18 0441)    Ruled out for MI with negative troponin in the setting of ongoing symptoms of 4 weeks duration.  Based on heart pathway there is no indication for second troponin.  This is likely MSK in nature.  But CT PE protocol to exclude PE given the time course of the pain and lack of response to all medications.   Final Clinical Impressions(s) / ED Diagnoses    Signed out to Dr. Sabra Heck pending results of CTA.       Loras Grieshop, MD 12/08/18 0272

## 2018-12-08 NOTE — Discharge Instructions (Signed)
Your CT scan was normal =- no signs of heart attack or blood clote  Thank you for letting us take care of you today!  Please obtain all of your results from medical records or have your doctors office obtain the results - share them with your doctor - you should be seen at your doctors office in the next 2 days. Call today to arrange your follow up. Take the medications as prescribed. Please review all of the medicines and only take them if you do not have an allergy to them. Please be aware that if you are taking birth control pills, taking other prescriptions, ESPECIALLY ANTIBIOTICS may make the birth control ineffective - if this is the case, either do not engage in sexual activity or use alternative methods of birth control such as condoms until you have finished the medicine and your family doctor says it is OK to restart them. If you are on a blood thinner such as COUMADIN, be aware that any other medicine that you take may cause the coumadin to either work too much, or not enough - you should have your coumadin level rechecked in next 7 days if this is the case.  ?  It is also a possibility that you have an allergic reaction to any of the medicines that you have been prescribed - Everybody reacts differently to medications and while MOST people have no trouble with most medicines, you may have a reaction such as nausea, vomiting, rash, swelling, shortness of breath. If this is the case, please stop taking the medicine immediately and contact your physician.   If you were given a medication in the ED such as percocet, vicodin, or morphine, be aware that these medicines are sedating and may change your ability to take care of yourself adequately for several hours after being given this medicines - you should not drive or take care of small children if you were given this medicine in the Emergency Department or if you have been prescribed these types of medicines. ?   You should return to the ER  IMMEDIATELY if you develop severe or worsening symptoms.

## 2018-12-19 ENCOUNTER — Other Ambulatory Visit: Payer: Self-pay | Admitting: Family Medicine

## 2018-12-19 DIAGNOSIS — N644 Mastodynia: Secondary | ICD-10-CM

## 2018-12-26 ENCOUNTER — Other Ambulatory Visit: Payer: Self-pay

## 2019-01-09 ENCOUNTER — Other Ambulatory Visit: Payer: Self-pay | Admitting: Family Medicine

## 2019-01-14 ENCOUNTER — Ambulatory Visit
Admission: RE | Admit: 2019-01-14 | Discharge: 2019-01-14 | Disposition: A | Discharge status: Home / Self Care | Payer: Self-pay | Source: Ambulatory Visit | Attending: Family Medicine | Admitting: Family Medicine

## 2019-01-14 ENCOUNTER — Other Ambulatory Visit: Payer: Self-pay

## 2019-01-14 DIAGNOSIS — N644 Mastodynia: Secondary | ICD-10-CM

## 2019-01-20 ENCOUNTER — Other Ambulatory Visit: Payer: Self-pay | Admitting: Oncology

## 2019-01-20 ENCOUNTER — Telehealth: Payer: Self-pay | Admitting: Oncology

## 2019-01-20 NOTE — Telephone Encounter (Signed)
Received a call from [name] to schedule a new patient appt for hx of breast cancer. Pt has been scheduled to see Dr. Jana Hakim on 10/1 at 4pm. She's been made aware to arrive 15 minutes early.

## 2019-01-26 ENCOUNTER — Telehealth: Payer: Self-pay | Admitting: Oncology

## 2019-01-26 NOTE — Telephone Encounter (Signed)
Received a staff msg to r/s MS. Ghosh's appt to see Dr. Jana Hakim on 9/25 at 930am w/labs at [facility]. Ms. Truss has been cld and made aware of her new appt.

## 2019-01-29 NOTE — Progress Notes (Signed)
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 Per Dr Gail Schmitt notes, her cancer was T2N1bM0, ERPR negative, HER2 negative, grade 3. She was treated under Dr Gail Schmitt in [facility] with a mastectomy and axillary dissection and 4 cycles of Adriamycin and Cytoxan. 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   More recently she reported local pain along the lateral aspect of the right breast. She underwent a digital diagnostic right mammogram with tomography and an ultrasound of the right breast on 01/14/2019 showing: Breast Density Category B. There are no suspicious masses, areas of architectural distortion, areas of significant asymmetry or suspicious calcifications. No mammographic change. There is no mammographic abnormality in the region of the patient's focal breast pain. On physical exam, patient is tender to palpation along the far lateral aspect of the right breast. There is no palpable mass. Targeted ultrasound is performed, showing normal tissue throughout the lateral aspect of the right breast in the area of the patient's focal pain. No mass or suspicious lesion.  The patient's subsequent history is as detailed below.   INTERVAL HISTORY: Gail Schmitt was evaluated in the breast cancer clinic on 01/30/2019.   REVIEW OF SYSTEMS: Gail Schmitt has been having pain in the lateral aspect of the right breast.  She is also in the middle of a myositis flare.   She is very concerned that "every time I get a flare it is because of cancer".  Currently she is in a great deal of pain, cannot walk very well, is using a cane, but denies unusual headaches, visual changes, nausea, vomiting cough, phlegm production or pleurisy.  She is very short of breath.  She is deconditioned.  She denies diarrhea, rash, or fever.  She has significant arthralgias and myalgias.  A detailed review of systems today was otherwise noncontributory  PAST MEDICAL HISTORY: Past Medical History:  Diagnosis Date   Arthritis    "left hip; hands" (07/28/2014)   Breast cancer, left breast ([place]) 1999   a. s/p left mastectomy   Dermatomyositis ([place])    a. 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 Cath ~ 10 + yrs ago, [facility], [place] - "normal but pressures were high."   Uterine  cancer [facility]) 2007     PAST SURGICAL HISTORY: Past Surgical History:  Procedure Laterality Date   BREAST BIOPSY Left 1999   CARDIAC CATHETERIZATION  07/13/2011   CHOLECYSTECTOMY N/A 07/31/2014   Procedure: LAPAROSCOPIC CHOLECYSTECTOMY WITH INTRAOPERATIVE CHOLANGIOGRAM;  Surgeon: Gail Luna, MD;  Location: [place];  Service: General;  Laterality: N/A;   INGUINAL HERNIA REPAIR Left [place] WITH CORONARY ANGIOGRAM N/A 07/13/2011    Procedure: LEFT HEART CATHETERIZATION WITH CORONARY ANGIOGRAM;  Surgeon: Gail Hector, MD;  Location: [facility] CATH LAB;  Service: Cardiovascular;  Laterality: N/A;   LEFT HEART CATHETERIZATION WITH CORONARY ANGIOGRAM N/A 07/30/2014   Procedure: LEFT HEART CATHETERIZATION WITH CORONARY ANGIOGRAM;  Surgeon: Gail Booze, MD;  Location: [facility] CATH LAB;  Service: Cardiovascular;  Laterality: N/A;   MASTECTOMY Left 1999   RECONSTRUCTION BREAST IMMEDIATE / DELAYED W/ TISSUE EXPANDER Left 2005   TOTAL ABDOMINAL HYSTERECTOMY  2007   TUBAL LIGATION  1990     FAMILY HISTORY: Family History  Problem Relation Age of Onset   Diabetes type II Mother    Colon polyps Mother    Other Mother        cholangiocarcinoma   Diabetes Sister    Stroke Brother    Colon cancer Neg Hx    Breast cancer Neg Hx    Gail Schmitt's father is living as of September 2020, in his mid 51s. Patients' mother died from an "unusual stomach cancer" at the age of 3.. The patient has 1 brothers and 2 sisters.  One maternal great aunts had breast cancer.  Patient denies anyone in her family having ovarian, prostate, or pancreatic cancer.    GYNECOLOGIC HISTORY:  No LMP recorded. Patient has had a hysterectomy. 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  Her husband Gail Schmitt is disabled secondary to a stroke.  It is just the 2 of them at home.  The patient's oldest daughter Gail Schmitt lives in [place] and has her own business.  Daughter Gail Schmitt lives in [place] and works for a bank.  Dr. Parke Schmitt lives in [place] and is disabled secondary to asked [employer]'s.  The patient has 4 grandchildren, no great-grandchildren.   ADVANCED DIRECTIVES: The  patient tells me her daughters Gail Schmitt and Gail Schmitt and are together her healthcare power of attorney   HEALTH MAINTENANCE: Social History   Tobacco Use   Smoking status: Never Smoker   Smokeless tobacco: Never Used  Substance Use Topics   Alcohol use: Not Currently    Alcohol/week: 1.0 standard drinks    Types: 1 Standard drinks or equivalent per week    Comment: 07/28/2014 "might have 1 drink a couple times/month"   Drug use: No    Colonoscopy: November 2019/ [facility]  PAP: STATUS post hysterectomy  Bone density: no   Allergies  Allergen Reactions   Meloxicam Other (See Comments)    Made her hurt more   Percocet [Oxycodone-Acetaminophen] Itching and Rash    Current Outpatient Medications  Medication Sig Dispense Refill   albuterol (VENTOLIN HFA) 108 (90 Base) MCG/ACT inhaler Inhale 1-2 puffs into the lungs every 6 (six) hours as needed for wheezing or shortness of breath. 8 g 0  amLODipine (NORVASC) 5 MG tablet Take 1 tablet by mouth once Schmitt 90 tablet 1   budesonide-formoterol (SYMBICORT) 160-4.5 MCG/ACT inhaler Inhale 2 puffs into the lungs 2 (two) times Schmitt.     cyclobenzaprine (FLEXERIL) 10 MG tablet Take 10 mg by mouth every 8 (eight) hours.     folic acid (FOLVITE) 1 MG tablet Take 1 mg by mouth Schmitt.     hydrochlorothiazide (HYDRODIURIL) 25 MG tablet Take 1 tablet by mouth once Schmitt (Patient taking differently: Take 25 mg by mouth. ) 90 tablet 2   hydrOXYzine (ATARAX/VISTARIL) 25 MG tablet Take 25 mg by mouth at bedtime.      latanoprost (XALATAN) 0.005 % ophthalmic solution INSTILL 1 DROP INTO BOTH EYES EVERY DAY AT BEDTIME     lidocaine (LIDODERM) 5 % Place 1 patch onto the skin Schmitt. Remove & Discard patch within 12 hours or as directed by MD 14 patch 0   lisinopril (ZESTRIL) 40 MG tablet TAKE ONE TABLET BY MOUTH Schmitt (Patient taking differently: Take 40 mg by mouth Schmitt. ) 90 tablet 2   loperamide (IMODIUM) 2 MG capsule Take 2 mg by mouth as  needed for diarrhea or loose stools.     loratadine (CLARITIN) 10 MG tablet Take 10 mg by mouth Schmitt.     methotrexate (RHEUMATREX) 2.5 MG tablet Take 10 mg by mouth 2 (two) times a week. On Friday and Saturday  2   Multiple Vitamin (MULTIVITAMIN WITH MINERALS) TABS tablet Take 1 tablet by mouth Schmitt.     omeprazole (PRILOSEC) 20 MG capsule Take 1 capsule (20 mg total) by mouth Schmitt. 30 capsule 0   triamcinolone cream (KENALOG) 0.1 % APPLY TO AFFECTED AREAS TWO TIMES Schmitt     nitroGLYCERIN (NITROSTAT) 0.4 MG SL tablet Place 1 tablet (0.4 mg total) under the tongue every 5 (five) minutes as needed for chest pain. 25 tablet 3   No current facility-administered medications for this visit.      OBJECTIVE: Morbidly obese African-American woman who appears older than stated age  Vitals:   01/30/19 0944  BP: 117/66  Pulse: 89  Resp: 17  Temp: 98.3 F (36.8 C)  SpO2: 98%   Wt Readings from Last 3 Encounters:  01/30/19 287 lb 8 oz (130.4 kg)  11/25/18 282 lb (127.9 kg)  04/14/18 286 lb (129.7 kg)   Body mass index is 47.84 kg/m.    ECOG FS:2 - Symptomatic, <50% confined to bed  Ocular: Sclerae unicteric, pupils round and equal Ear-nose-throat: Wearing a mask Lymphatic: No cervical or supraclavicular adenopathy Lungs no rales or rhonchi Heart regular rate and rhythm Abd soft, obese, nontender, positive bowel sounds MSK no focal spinal tenderness Neuro: non-focal, well-oriented, appropriate affect Breasts: I do not palpate a mass in the right breast.  There are no nipple or skin changes of concern.  The left breast is status post mastectomy with saline implant reconstruction.  There is no evidence of local recurrence.  Both axillae are benign.   LAB RESULTS:  CMP     Component Value Date/Time   NA 141 01/30/2019 0924   K 3.2 (L) 01/30/2019 0924   CL 102 01/30/2019 0924   CO2 29 01/30/2019 0924   GLUCOSE 115 (H) 01/30/2019 0924   BUN 16 01/30/2019 0924    CREATININE 0.84 01/30/2019 0924   CREATININE 0.71 02/21/2016 1119   CALCIUM 9.5 01/30/2019 0924   PROT 7.4 01/30/2019 0924   ALBUMIN 4.0 01/30/2019 0924   AST 17 01/30/2019  0924   ALT 10 01/30/2019 0924   ALKPHOS 74 01/30/2019 0924   BILITOT 0.5 01/30/2019 0924   GFRNONAA >60 01/30/2019 0924   GFRNONAA >89 07/04/2014 1042   GFRAA >60 01/30/2019 0924   GFRAA >89 07/04/2014 1042    No results found for: TOTALPROTELP, ALBUMINELP, A1GS, A2GS, BETS, BETA2SER, GAMS, MSPIKE, SPEI  No results found for: KPAFRELGTCHN, LAMBDASER, [facility]  Lab Results  Component Value Date   WBC 4.4 01/30/2019   NEUTROABS 2.4 01/30/2019   HGB 12.4 01/30/2019   HCT 38.1 01/30/2019   MCV 91.1 01/30/2019   PLT 248 01/30/2019    No results found for: LABCA2  No components found for: [id number]  No results for input(s): INR in the last 168 hours.  No results found for: LABCA2  No results found for: YQM250  No results found for: IBB048  No results found for: GQB169  No results found for: CA2729  No components found for: HGQUANT  No results found for: CEA1 / No results found for: CEA1   No results found for: AFPTUMOR  No results found for: CHROMOGRNA  No results found for: PSA1  Appointment on 01/30/2019  Component Date Value Ref Range Status   Sodium 01/30/2019 141  135 - 145 mmol/L Final   Potassium 01/30/2019 3.2* 3.5 - 5.1 mmol/L Final   Chloride 01/30/2019 102  98 - 111 mmol/L Final   CO2 01/30/2019 29  22 - 32 mmol/L Final   Glucose, Bld 01/30/2019 115* 70 - 99 mg/dL Final   BUN 01/30/2019 16  6 - 20 mg/dL Final   Creatinine, Ser 01/30/2019 0.84  0.44 - 1.00 mg/dL Final   Calcium 01/30/2019 9.5  8.9 - 10.3 mg/dL Final   Total Protein 01/30/2019 7.4  6.5 - 8.1 g/dL Final   Albumin 01/30/2019 4.0  3.5 - 5.0 g/dL Final   AST 01/30/2019 17  15 - 41 U/L Final   ALT 01/30/2019 10  0 - 44 U/L Final   Alkaline Phosphatase 01/30/2019 74  38 - 126 U/L Final   Total  Bilirubin 01/30/2019 0.5  0.3 - 1.2 mg/dL Final   GFR calc non Af Amer 01/30/2019 >60  >60 mL/min Final   GFR calc Af Amer 01/30/2019 >60  >60 mL/min Final   Anion gap 01/30/2019 10  5 - 15 Final   Performed at [facility], [place] [address] [place], [place] [address]   WBC 01/30/2019 4.4  4.0 - 10.5 K/uL Final   RBC 01/30/2019 4.18  3.87 - 5.11 MIL/uL Final   Hemoglobin 01/30/2019 12.4  12.0 - 15.0 g/dL Final   HCT 01/30/2019 38.1  36.0 - 46.0 % Final   MCV 01/30/2019 91.1  80.0 - 100.0 fL Final   MCH 01/30/2019 29.7  26.0 - 34.0 pg Final   MCHC 01/30/2019 32.5  30.0 - 36.0 g/dL Final   RDW 01/30/2019 14.4  11.5 - 15.5 % Final   Platelets 01/30/2019 248  150 - 400 K/uL Final   nRBC 01/30/2019 0.0  0.0 - 0.2 % Final   Neutrophils Relative % 01/30/2019 54  % Final   Neutro Abs 01/30/2019 2.4  1.7 - 7.7 K/uL Final   Lymphocytes Relative 01/30/2019 29  % Final   Lymphs Abs 01/30/2019 1.3  0.7 - 4.0 K/uL Final   Monocytes Relative 01/30/2019 10  % Final   Monocytes Absolute 01/30/2019 0.5  0.1 - 1.0 K/uL Final   Eosinophils Relative 01/30/2019 6  % Final  Eosinophils Absolute 01/30/2019 0.3  0.0 - 0.5 K/uL Final   Basophils Relative 01/30/2019 1  % Final   Basophils Absolute 01/30/2019 0.1  0.0 - 0.1 K/uL Final   Immature Granulocytes 01/30/2019 0  % Final   Abs Immature Granulocytes 01/30/2019 0.01  0.00 - 0.07 K/uL Final   Performed at [facility], [address] [place], [place] [address]    (this displays the last labs from the last 3 days)  No results found for: TOTALPROTELP, ALBUMINELP, A1GS, A2GS, BETS, BETA2SER, GAMS, MSPIKE, SPEI (this displays SPEP labs)  No results found for: KPAFRELGTCHN, LAMBDASER, KAPLAMBRATIO (kappa/lambda light chains)  No results found for: HGBA, HGBA2QUANT, HGBFQUANT, HGBSQUAN (Hemoglobinopathy evaluation)   No results found for: LDH  No results found for: IRON, TIBC,  IRONPCTSAT (Iron and TIBC)  No results found for: FERRITIN  Urinalysis    Component Value Date/Time   LABSPEC >=1.030 09/18/2017 1123   PHURINE 6.5 09/18/2017 1123   GLUCOSEU NEGATIVE 09/18/2017 1123   HGBUR NEGATIVE 09/18/2017 [place] 09/18/2017 1123   BILIRUBINUR negative 04/09/2016 1803   BILIRUBINUR Negative 08/20/2014 1444   KETONESUR NEGATIVE 09/18/2017 1123   PROTEINUR NEGATIVE 09/18/2017 1123   UROBILINOGEN 0.2 09/18/2017 1123   NITRITE NEGATIVE 09/18/2017 1123   LEUKOCYTESUR TRACE (A) 09/18/2017 1123     STUDIES:  US Breast Ltd Uni Right Inc Axilla  Result Date: 01/14/2019 CLINICAL DATA:  Patient presents complaining of focal pain along the lateral aspect of the right breast. She has history of left mastectomy for breast carcinoma. She reports having had left breast pain in the location of her breast carcinoma, which has heightened her concern about her lateral right breast pain. EXAM: DIGITAL DIAGNOSTIC RIGHT MAMMOGRAM WITH CAD AND TOMO ULTRASOUND RIGHT BREAST COMPARISON:  Previous exam(s). ACR Breast Density Category b: There are scattered areas of fibroglandular density. FINDINGS: There are no suspicious masses, areas of architectural distortion, areas of significant asymmetry or suspicious calcifications. No mammographic change. There is no mammographic abnormality in the region of the patient's focal breast pain. Mammographic images were processed with CAD. On physical exam, patient is tender to palpation along the far lateral aspect of the right breast. There is no palpable mass. Targeted ultrasound is performed, showing normal tissue throughout the lateral aspect of the right breast in the area of the patient's focal pain. No mass or suspicious lesion. IMPRESSION: 1. Negative exam.  No evidence of right breast malignancy. RECOMMENDATION: 1.  Screening mammogram in one year.(Code:SM-B-01Y) 2. If the patient's focal breast pain persists and remains of  clinical concern, consider follow-up breast MRI for further assessment. I have discussed the findings and recommendations with the patient. If applicable, a reminder letter will be sent to the patient regarding the next appointment. BI-RADS CATEGORY  1: Negative. Electronically Signed   By: Lajean Manes M.D.   On: 01/14/2019 09:45   Mm Diag Breast Tomo Uni Right  Result Date: 01/14/2019 CLINICAL DATA:  Patient presents complaining of focal pain along the lateral aspect of the right breast. She has history of left mastectomy for breast carcinoma. She reports having had left breast pain in the location of her breast carcinoma, which has heightened her concern about her lateral right breast pain. EXAM: DIGITAL DIAGNOSTIC RIGHT MAMMOGRAM WITH CAD AND TOMO ULTRASOUND RIGHT BREAST COMPARISON:  Previous exam(s). ACR Breast Density Category b: There are scattered areas of fibroglandular density. FINDINGS: There are no suspicious masses, areas of architectural distortion, areas  of significant asymmetry or suspicious calcifications. No mammographic change. There is no mammographic abnormality in the region of the patient's focal breast pain. Mammographic images were processed with CAD. On physical exam, patient is tender to palpation along the far lateral aspect of the right breast. There is no palpable mass. Targeted ultrasound is performed, showing normal tissue throughout the lateral aspect of the right breast in the area of the patient's focal pain. No mass or suspicious lesion. IMPRESSION: 1. Negative exam.  No evidence of right breast malignancy. RECOMMENDATION: 1.  Screening mammogram in one year.(Code:SM-B-01Y) 2. If the patient's focal breast pain persists and remains of clinical concern, consider follow-up breast MRI for further assessment. I have discussed the findings and recommendations with the patient. If applicable, a reminder letter will be sent to the patient regarding the next appointment. BI-RADS  CATEGORY  1: Negative. Electronically Signed   By: Lajean Manes M.D.   On: 01/14/2019 09:45     ELIGIBLE FOR AVAILABLE RESEARCH PROTOCOL: No   ASSESSMENT: 58 y.o. [place], [place] woman with a long history of dermatomyositis, as well as breast cancer, as follows  (1) status post left mastectomy and axillary lymph node dissection 1999 for a T2 N1b, stage IIIB invasive breast cancer, triple negative  (a) status post cyclophosphamide and doxorubicin x4  (b) did not receive adjuvant radiation secondary to history of myositis  (2) status post hysterectomy and bilateral salpingo-oophorectomy 2004 for what seems to have been a stage I endometrial carcinoma   PLAN: I spent approximately 60 minutes face to face with Gail Schmitt with more than 50% of that time spent in counseling and coordination of care. Specifically we reviewed the biology of the patient's diagnosis and the specifics of her situation.  Gail Schmitt is very concerned that every time she gets a flare of her myositis it is because of cancer somewhere.  She understands that patients with myositis do have flares whether or not they have cancer.  Nevertheless to her her own history is compelling.  In addition of course any patient with cancer does feel very vulnerable and it is not unusual for my patients to think that any small much less large symptom means that there is a cancer recurrence.  Because of this concern she has been very thoroughly evaluated, with CTs of the chest abdomen and pelvis at [facility] last year, with an EGD and colonoscopy last year, more recently with a CT angio of the chest August 2020, and then right mammography and right ultrasonography including the right axilla 01/14/2019.  Even though none of this has shown any evidence of cancer she still is very concerned.  I reassured Gail Schmitt that it is not strange to feel very vulnerable to a diagnosis of cancer recurrence.  My concern is that radiation can actually cause cancer and if  she continues to receive multiple scans she may end up developing a sarcoma and actually be worse off than before.  Accordingly if she has any studies in the future MRI or ultrasound would be preferred.  She did have a 0.7 cm lesion in her liver noted on CT of the abdomen last year.  This is very likely benign.  Ultrasound was suggested and I am setting her up for an ultrasound of the liver in the near future.  I am concerned that she has not been genetically tested.  She is at very high risk for carrying a deleterious mutation.  We will get genetics done and if she does have a  BRCA mutation we will discuss either intensified screening or right mastectomy.  She has already had bilateral salpingo-oophorectomy by her account.  Of course her daughters might need to be tested as well.  Finally I am concerned that currently she has no rheumatologic follow-up.  There is a referral in process to the [facility] myositis clinic.  I am going to see her again in 2 months.  She knows to call for any other issue that may develop before the next visit.       Gail Schmitt has a good understanding of the overall plan. She agrees with it. She knows the goal of treatment in her case is cure. She will call with any problems that may develop before her next visit here.  We discussed the fact that   Gail Schmitt, Gail Dad, MD  01/30/19 10:51 AM Medical Oncology and Hematology [facility] [place], [place] [address] Tel. [phone]    Fax. [phone]   I, Jacqualyn Posey am acting as a Education administrator for Chauncey Cruel, MD.   I, Lurline Del MD, have reviewed the above documentation for accuracy and completeness, and I agree with the above.

## 2019-01-30 ENCOUNTER — Inpatient Hospital Stay: Payer: Medicaid Other

## 2019-01-30 ENCOUNTER — Other Ambulatory Visit: Payer: Self-pay

## 2019-01-30 ENCOUNTER — Inpatient Hospital Stay: Payer: Medicaid Other | Attending: Oncology | Admitting: Oncology

## 2019-01-30 VITALS — BP 117/66 | HR 89 | Temp 98.3°F | Resp 17 | Ht 65.0 in | Wt 287.5 lb

## 2019-01-30 DIAGNOSIS — Z853 Personal history of malignant neoplasm of breast: Secondary | ICD-10-CM

## 2019-01-30 DIAGNOSIS — Z86711 Personal history of pulmonary embolism: Secondary | ICD-10-CM

## 2019-01-30 DIAGNOSIS — I10 Essential (primary) hypertension: Secondary | ICD-10-CM | POA: Diagnosis not present

## 2019-01-30 DIAGNOSIS — E669 Obesity, unspecified: Secondary | ICD-10-CM

## 2019-01-30 DIAGNOSIS — Z9012 Acquired absence of left breast and nipple: Secondary | ICD-10-CM | POA: Diagnosis not present

## 2019-01-30 DIAGNOSIS — Z803 Family history of malignant neoplasm of breast: Secondary | ICD-10-CM | POA: Insufficient documentation

## 2019-01-30 LAB — COMPREHENSIVE METABOLIC PANEL
ALT: 10 U/L (ref 0–44)
AST: 17 U/L (ref 15–41)
Albumin: 4 g/dL (ref 3.5–5.0)
Alkaline Phosphatase: 74 U/L (ref 38–126)
Anion gap: 10 (ref 5–15)
BUN: 16 mg/dL (ref 6–20)
CO2: 29 mmol/L (ref 22–32)
Calcium: 9.5 mg/dL (ref 8.9–10.3)
Chloride: 102 mmol/L (ref 98–111)
Creatinine, Ser: 0.84 mg/dL (ref 0.44–1.00)
GFR calc Af Amer: >60 mL/min (ref >60)
GFR calc non Af Amer: >60 mL/min (ref >60)
Glucose, Bld: 115 mg/dL — ABNORMAL HIGH (ref 70–99)
Potassium: 3.2 mmol/L — ABNORMAL LOW (ref 3.5–5.1)
Sodium: 141 mmol/L (ref 135–145)
Total Bilirubin: 0.5 mg/dL (ref 0.3–1.2)
Total Protein: 7.4 g/dL (ref 6.5–8.1)

## 2019-01-30 LAB — CBC WITH DIFFERENTIAL/PLATELET
Abs Immature Granulocytes: 0.01 10*3/uL (ref 0.00–0.07)
Basophils Absolute: 0.1 10*3/uL (ref 0.0–0.1)
Basophils Relative: 1 %
Eosinophils Absolute: 0.3 10*3/uL (ref 0.0–0.5)
Eosinophils Relative: 6 %
HCT: 38.1 % (ref 36.0–46.0)
Hemoglobin: 12.4 g/dL (ref 12.0–15.0)
Immature Granulocytes: 0 %
Lymphocytes Relative: 29 %
Lymphs Abs: 1.3 10*3/uL (ref 0.7–4.0)
MCH: 29.7 pg (ref 26.0–34.0)
MCHC: 32.5 g/dL (ref 30.0–36.0)
MCV: 91.1 fL (ref 80.0–100.0)
Monocytes Absolute: 0.5 10*3/uL (ref 0.1–1.0)
Monocytes Relative: 10 %
Neutro Abs: 2.4 10*3/uL (ref 1.7–7.7)
Neutrophils Relative %: 54 %
Platelets: 248 10*3/uL (ref 150–400)
RBC: 4.18 MIL/uL (ref 3.87–5.11)
RDW: 14.4 % (ref 11.5–15.5)
WBC: 4.4 10*3/uL (ref 4.0–10.5)
nRBC: 0 % (ref 0.0–0.2)

## 2019-01-31 LAB — CANCER ANTIGEN 27.29: CA 27.29: 27.7 U/mL (ref 0.0–38.6)

## 2019-02-02 ENCOUNTER — Telehealth: Payer: Self-pay | Admitting: *Deleted

## 2019-02-02 ENCOUNTER — Telehealth: Payer: Self-pay | Admitting: Oncology

## 2019-02-02 NOTE — Telephone Encounter (Signed)
This RN faxed referral for consult to the Myositis Clinic at [facility] at fax number [phone].  Phone number for clinic is [phone].

## 2019-02-02 NOTE — Telephone Encounter (Signed)
I talk with patient regarding schedule  

## 2019-02-03 ENCOUNTER — Telehealth: Payer: Self-pay | Admitting: Licensed Clinical Social Worker

## 2019-02-03 NOTE — Telephone Encounter (Signed)
Called patient to schedule genetic counseling appointment. She would like a virtual appointment, she is scheduled to see me 10/1 at 9 am and will have blood drawn on 10/7.

## 2019-02-04 ENCOUNTER — Telehealth: Payer: Self-pay | Admitting: *Deleted

## 2019-02-04 NOTE — Telephone Encounter (Signed)
Received call from pt stating she reached out to the Myositis clinic with [facility] to set up an apt.  Pt states that [facility] informed her that they have no received our referral.  Pt states she was given another fax number for [place] to send the referral to [phone] and a direct number to the Myositis clinic [phone].  Referral faxed to number provided by pt.

## 2019-02-05 ENCOUNTER — Ambulatory Visit: Payer: Self-pay | Admitting: Oncology

## 2019-02-05 ENCOUNTER — Encounter: Payer: Self-pay | Admitting: Licensed Clinical Social Worker

## 2019-02-05 ENCOUNTER — Inpatient Hospital Stay: Payer: Medicaid Other | Attending: Oncology | Admitting: Licensed Clinical Social Worker

## 2019-02-05 DIAGNOSIS — Z800 Family history of malignant neoplasm of digestive organs: Secondary | ICD-10-CM | POA: Insufficient documentation

## 2019-02-05 DIAGNOSIS — Z808 Family history of malignant neoplasm of other organs or systems: Secondary | ICD-10-CM | POA: Diagnosis not present

## 2019-02-05 DIAGNOSIS — Z853 Personal history of malignant neoplasm of breast: Secondary | ICD-10-CM | POA: Insufficient documentation

## 2019-02-05 DIAGNOSIS — Z8542 Personal history of malignant neoplasm of other parts of uterus: Secondary | ICD-10-CM | POA: Insufficient documentation

## 2019-02-05 DIAGNOSIS — Z803 Family history of malignant neoplasm of breast: Secondary | ICD-10-CM | POA: Diagnosis not present

## 2019-02-05 NOTE — Progress Notes (Signed)
REFERRING PROVIDER: Chauncey Cruel, MD [address] [place],  [place] [address]  PRIMARY PROVIDER:  [name], MD  PRIMARY REASON FOR VISIT:  1. History of breast cancer   2. Family history of brain cancer   3. Family history of stomach cancer   4. Family history of breast cancer   5. History of uterine cancer     I connected with Gail Schmitt on 02/05/2019 at 8:55 AM  EDT by Jackquline Denmark and verified that I am speaking with the correct person using two identifiers.    Patient location: home Provider location: office  HISTORY OF PRESENT ILLNESS:   [name], a 58 y.o. female, was seen for a [facility] cancer genetics consultation at the request of Dr. Jana Hakim due to a personal and family history of cancer.  Gail Schmitt presents to clinic today to discuss the possibility of a hereditary predisposition to cancer, genetic testing, and to further clarify her future cancer risks, as well as potential cancer risks for family members.   In 1999, at the age of 66, Gail Schmitt was diagnosed with cancer of the right breast, triple negative. The treatment plan included mastectomy and chemotherapy.   In 2004, at the age of 63, Gail Schmitt underwent TAH-BSO and reports that an endometrial cancer was discovered. She reports no other treatment for this.    CANCER HISTORY:  Oncology History   No history exists.     RISK FACTORS:  Menarche was at age 8.  First live birth at age 17.  OCP use for very short time at age 29. Ovaries intact: no.  Hysterectomy: yes.  Menopausal status: postmenopausal.  HRT use: 0 years. Colonoscopy: yes; she notes cancerous polyps in 2013. Mammogram within the last year: yes. Number of breast biopsies: 1. Up to date with pelvic exams: no. Any excessive radiation exposure in the past: no  Past Medical History:  Diagnosis Date  . Arthritis    "left hip; hands" (07/28/2014)  . Breast cancer, left breast ([facility]) 1999   a. s/p left mastectomy  .  Dermatomyositis ([facility])    a. noted prior to diagnosis of Breast CA ("i'm allergic to cancer")  . 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 Family history of adverse reaction to anesthesia    "all my daughters get real sick and throw up alot"  . Family history of brain cancer   . Family history of breast cancer   . Family history of stomach cancer   . Fatty liver   . Headache    "maybe monthly" (07/28/2014)  . Heart murmur   . Hypertension   . Mitral valve prolapse   . Normal coronary arteries    last cath 2016  . Obesity   . Pneumonia    "haven't had it in the last 4 yrs; before that I had it q yr for 4-5 years" (07/28/2014)  . Pulmonary embolism ([facility]) ~ 2003  . RBBB    with symptomatic bradycardia  . Tubular adenoma of colon   . Unstable angina ([facility])    a. Cath ~ 10 + yrs ago, [facility], [place] - "normal but pressures were high."  . Uterine cancer ([facility]) 2007    Past Surgical History:  Procedure Laterality Date  . BREAST BIOPSY Left 1999  . CARDIAC CATHETERIZATION  07/13/2011  . CHOLECYSTECTOMY N/A 07/31/2014   Procedure: LAPAROSCOPIC CHOLECYSTECTOMY WITH INTRAOPERATIVE CHOLANGIOGRAM;  Surgeon: Erroll Luna, MD;  Location: [facility];  Service: General;  Laterality:  N/A;  . INGUINAL HERNIA REPAIR Left 1986  . LAPAROSCOPIC TOTAL HYSTERECTOMY    . LEFT HEART CATHETERIZATION WITH CORONARY ANGIOGRAM N/A 07/13/2011   Procedure: LEFT HEART CATHETERIZATION WITH CORONARY ANGIOGRAM;  Surgeon: Josue Hector, MD;  Location: [facility] CATH LAB;  Service: Cardiovascular;  Laterality: N/A;  . LEFT HEART CATHETERIZATION WITH CORONARY ANGIOGRAM N/A 07/30/2014   Procedure: LEFT HEART CATHETERIZATION WITH CORONARY ANGIOGRAM;  Surgeon: [name], MD;  Location: [facility] CATH LAB;  Service: Cardiovascular;  Laterality: N/A;  . MASTECTOMY Left 1999  . RECONSTRUCTION BREAST IMMEDIATE / DELAYED W/ TISSUE EXPANDER Left 2005  . TOTAL ABDOMINAL HYSTERECTOMY  2007  . TUBAL LIGATION  1990     Social History   Socioeconomic History  . Marital status: Divorced    Spouse name: Not on file  . Number of children: 3  . Years of education: Not on file  . Highest education level: Not on file  Occupational History  . Occupation: [employer]  Social Needs  . Financial resource strain: Not on file  . Food insecurity    Worry: Not on file    Inability: Not on file  . Transportation needs    Medical: Not on file    Non-medical: Not on file  Tobacco Use  . Smoking status: Never Smoker  . Smokeless tobacco: Never Used  Substance and Sexual Activity  . Alcohol use: Not Currently    Alcohol/week: 1.0 standard drinks    Types: 1 Standard drinks or equivalent per week    Comment: 07/28/2014 "might have 1 drink a couple times/month"  . Drug use: No  . Sexual activity: Not Currently  Lifestyle  . Physical activity    Days per week: Not on file    Minutes per session: Not on file  . Stress: Not on file  Relationships  . Social Herbalist on phone: Not on file    Gets together: Not on file    Attends religious service: Not on file    Active member of club or organization: Not on file    Attends meetings of clubs or organizations: Not on file    Relationship status: Not on file  Other Topics Concern  . Not on file  Social History Narrative   Lives @ home in [place] with 1 of her 3 dtrs.  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  Doesn't follow any particular diet.     FAMILY HISTORY:  We obtained a detailed, 4-generation family history.  Significant diagnoses are listed below: Family History  Problem Relation Age of Onset  . Diabetes type II Mother   . Colon polyps Mother   . Other Mother        cholangiocarcinoma  . Diabetes Sister   . Stroke Brother   . Colon cancer Neg Hx   . Breast cancer Neg Hx     Gail Schmitt has 3 daughters, no cancers. One of her daughters, Gail Schmitt, had genetic testing  that was reportedly positive. Gail Schmitt does not know any details about the gene or what testing this was, but does note that her daughter gets her breasts checked every 6 months and is seen at the cancer center. Ms. Stang has 1 brother and 2 sisters, no cancers.   Ms. Tool mother died at 54 and had a "very rare type" of stomach cancer. Patient's mother was an only child. Patient's maternal  grandmother died "young in a shooting" and maternal grandfather died at 53. Patient's maternal grandmother's father's sister had breast cancer in her 21s.   Ms. Hedstrom father is living at 41, he has not had a cancer diagnosis. Patient had 4 paternal uncles, 5 paternal aunts. One of her aunts had brain cancer, another aunt had cancer but she is unsure the type. No known cancers in paternal cousins, she does report she has limited information about this side of the family. Paternal grandmother died at 51 of brain cancer, paternal grandfather died at 34.   Ms. Lienhard is unaware of previous family history of genetic testing for hereditary cancer risks. Patient's maternal ancestors are of [place], [place], [place], [place] descent. There is no reported Ashkenazi Jewish ancestry. There is no known consanguinity.  GENETIC COUNSELING ASSESSMENT: Ms. Rauda is a 58 y.o. female with a personal and family history which is somewhat suggestive of a hereditary cancer syndrome and predisposition to cancer. We, therefore, discussed and recommended the following at today's visit.   DISCUSSION: We discussed that 5 - 10% of breast cancer is hereditary, with most cases associated with BRCA1/BRCA2 mutations.  There are other genes that can be associated with hereditary breast cancer syndromes.  These include PALB2, CHEK2, ATM. It does sound like her daughter tested positive for a gene that would increase the risk for breast cancer, which likely means that Ms. Monts has this same mutation. Ms. Tegtmeyer will ask her daughter for more  information and call me.  We discussed that testing is beneficial for several reasons including knowing how to follow individuals for cancer screenings, and understand if other family members could be at risk for cancer and allow them to undergo genetic testing.   We reviewed the characteristics, features and inheritance patterns of hereditary cancer syndromes. We also discussed genetic testing, including the appropriate family members to test, the process of testing, insurance coverage and turn-around-time for results. We discussed the implications of a negative, positive and/or variant of uncertain significant result. We recommended Ms. Foulkes pursue genetic testing for the [facility] Multi-Cancer gene panel.   The Multi-Cancer Panel offered by [facility] includes sequencing and/or deletion duplication testing of the following 85 genes: AIP, ALK, APC, ATM, AXIN2,BAP1,  BARD1, BLM, BMPR1A, BRCA1, BRCA2, BRIP1, CASR, CDC73, CDH1, CDK4, CDKN1B, CDKN1C, CDKN2A (p14ARF), CDKN2A (p16INK4a), CEBPA, CHEK2, CTNNA1, DICER1, DIS3L2, EGFR (c.2369C>T, p.Thr790Met variant only), EPCAM (Deletion/duplication testing only), FH, FLCN, GATA2, GPC3, GREM1 (Promoter region deletion/duplication testing only), HOXB13 (c.251G>A, p.Gly84Glu), HRAS, KIT, MAX, MEN1, MET, MITF (c.952G>A, p.Glu318Lys variant only), MLH1, MSH2, MSH3, MSH6, MUTYH, NBN, NF1, NF2, NTHL1, PALB2, PDGFRA, PHOX2B, PMS2, POLD1, POLE, POT1, PRKAR1A, PTCH1, PTEN, RAD50, RAD51C, RAD51D, RB1, RECQL4, RET, RNF43, RUNX1, SDHAF2, SDHA (sequence changes only), SDHB, SDHC, SDHD, SMAD4, SMARCA4, SMARCB1, SMARCE1, STK11, SUFU, TERC, TERT, TMEM127, TP53, TSC1, TSC2, VHL, WRN and WT1.   Based on Ms. Mikowski personal and family history of cancer, she meets medical criteria for genetic testing. Despite that she meets criteria, she may still have an out of pocket cost.   PLAN: After considering the risks, benefits, and limitations, Ms. Khanna provided informed consent to pursue  genetic testing. She will have her blood drawn for this on 10/7 and the blood sample will be sent to [facility] for analysis of the Multi-Cancer Panel. Results should be available within approximately 2-3 weeks' time after her blood draw, at which point they will be disclosed by telephone to Ms. Claiborne Billings, as will any additional recommendations warranted by  these results. Ms. Quevedo will receive a summary of her genetic counseling visit and a copy of her results once available. This information will also be available in Epic.   Lastly, we encouraged Ms. Gardiner to remain in contact with cancer genetics annually so that we can continuously update the family history and inform her of any changes in cancer genetics and testing that may be of benefit for this family.   Ms. Capano questions were answered to her satisfaction today. Our contact information was provided should additional questions or concerns arise. Thank you for the referral and allowing [place] to share in the care of your patient.   Faith Rogue, MS, [facility] Genetic Counselor [email or web address] Phone: [phone]  The patient was seen for a total of 40 minutes in virtual genetic counseling.  Drs. Magrinat, Lindi Adie and/or Burr Medico were available for discussion regarding this case.   _______________________________________________________________________ For Office Staff:  Number of people involved in session: 1 Was an Intern/ student involved with case: no

## 2019-02-05 NOTE — Addendum Note (Signed)
Addended by: Faith Rogue T on: 02/05/2019 10:15 AM   Modules accepted: Orders

## 2019-02-06 ENCOUNTER — Ambulatory Visit (HOSPITAL_COMMUNITY)
Admission: RE | Admit: 2019-02-06 | Discharge: 2019-02-06 | Disposition: A | Discharge status: Home / Self Care | Payer: Medicaid Other | Source: Ambulatory Visit | Attending: Oncology | Admitting: Oncology

## 2019-02-06 ENCOUNTER — Other Ambulatory Visit: Payer: Self-pay

## 2019-02-06 DIAGNOSIS — Z853 Personal history of malignant neoplasm of breast: Secondary | ICD-10-CM | POA: Diagnosis present

## 2019-02-09 ENCOUNTER — Encounter: Payer: Self-pay | Admitting: Oncology

## 2019-02-09 NOTE — Progress Notes (Signed)
Pt did not show for 09/28 appt

## 2019-02-11 ENCOUNTER — Encounter: Payer: Medicaid Other | Admitting: Genetic Counselor

## 2019-02-11 ENCOUNTER — Other Ambulatory Visit: Payer: Self-pay

## 2019-02-11 ENCOUNTER — Inpatient Hospital Stay: Payer: Medicaid Other

## 2019-02-11 DIAGNOSIS — Z808 Family history of malignant neoplasm of other organs or systems: Secondary | ICD-10-CM

## 2019-02-11 DIAGNOSIS — Z853 Personal history of malignant neoplasm of breast: Secondary | ICD-10-CM

## 2019-02-11 DIAGNOSIS — Z8542 Personal history of malignant neoplasm of other parts of uterus: Secondary | ICD-10-CM

## 2019-02-11 DIAGNOSIS — Z803 Family history of malignant neoplasm of breast: Secondary | ICD-10-CM | POA: Diagnosis not present

## 2019-02-11 DIAGNOSIS — Z86711 Personal history of pulmonary embolism: Secondary | ICD-10-CM

## 2019-02-11 DIAGNOSIS — Z800 Family history of malignant neoplasm of digestive organs: Secondary | ICD-10-CM

## 2019-02-11 LAB — COMPREHENSIVE METABOLIC PANEL
ALT: 13 U/L (ref 0–44)
AST: 18 U/L (ref 15–41)
Albumin: 3.8 g/dL (ref 3.5–5.0)
Alkaline Phosphatase: 76 U/L (ref 38–126)
Anion gap: 10 (ref 5–15)
BUN: 15 mg/dL (ref 6–20)
CO2: 29 mmol/L (ref 22–32)
Calcium: 9.7 mg/dL (ref 8.9–10.3)
Chloride: 102 mmol/L (ref 98–111)
Creatinine, Ser: 0.87 mg/dL (ref 0.44–1.00)
GFR calc Af Amer: >60 mL/min (ref >60)
GFR calc non Af Amer: >60 mL/min (ref >60)
Glucose, Bld: 112 mg/dL — ABNORMAL HIGH (ref 70–99)
Potassium: 3.4 mmol/L — ABNORMAL LOW (ref 3.5–5.1)
Sodium: 141 mmol/L (ref 135–145)
Total Bilirubin: 0.6 mg/dL (ref 0.3–1.2)
Total Protein: 7.7 g/dL (ref 6.5–8.1)

## 2019-02-11 LAB — CBC WITH DIFFERENTIAL/PLATELET
Abs Immature Granulocytes: 0 10*3/uL (ref 0.00–0.07)
Basophils Absolute: 0.1 10*3/uL (ref 0.0–0.1)
Basophils Relative: 1 %
Eosinophils Absolute: 0.2 10*3/uL (ref 0.0–0.5)
Eosinophils Relative: 4 %
HCT: 37.3 % (ref 36.0–46.0)
Hemoglobin: 12 g/dL (ref 12.0–15.0)
Immature Granulocytes: 0 %
Lymphocytes Relative: 25 %
Lymphs Abs: 1.1 10*3/uL (ref 0.7–4.0)
MCH: 29.9 pg (ref 26.0–34.0)
MCHC: 32.2 g/dL (ref 30.0–36.0)
MCV: 93 fL (ref 80.0–100.0)
Monocytes Absolute: 0.3 10*3/uL (ref 0.1–1.0)
Monocytes Relative: 7 %
Neutro Abs: 2.7 10*3/uL (ref 1.7–7.7)
Neutrophils Relative %: 63 %
Platelets: 289 10*3/uL (ref 150–400)
RBC: 4.01 MIL/uL (ref 3.87–5.11)
RDW: 14.5 % (ref 11.5–15.5)
WBC: 4.3 10*3/uL (ref 4.0–10.5)
nRBC: 0 % (ref 0.0–0.2)

## 2019-02-12 ENCOUNTER — Telehealth: Payer: Self-pay

## 2019-02-12 LAB — CANCER ANTIGEN 27.29: CA 27.29: 30.2 U/mL (ref 0.0–38.6)

## 2019-02-12 NOTE — Telephone Encounter (Signed)
-----   Message from Gardenia Phlegm, NP sent at 02/11/2019  9:54 PM EDT ----- Please recommend that patient increase her potassium rich diet ----- Message ----- From: Buel Ream, Lab [facility] [place] Sent: 02/11/2019  11:02 AM EDT To: Chauncey Cruel, MD

## 2019-02-12 NOTE — Telephone Encounter (Signed)
Spoke with patient to inform of slightly low potassium level.  Reccommended to increase potassium in diet.  Per patient, gave some examples of potassium rich foods.  She voiced understanding and thanks for call.

## 2019-02-20 ENCOUNTER — Ambulatory Visit: Payer: Self-pay | Admitting: Licensed Clinical Social Worker

## 2019-02-20 ENCOUNTER — Encounter: Payer: Self-pay | Admitting: Licensed Clinical Social Worker

## 2019-02-20 ENCOUNTER — Telehealth: Payer: Self-pay | Admitting: Licensed Clinical Social Worker

## 2019-02-20 DIAGNOSIS — Z853 Personal history of malignant neoplasm of breast: Secondary | ICD-10-CM

## 2019-02-20 DIAGNOSIS — Z1379 Encounter for other screening for genetic and chromosomal anomalies: Secondary | ICD-10-CM

## 2019-02-20 DIAGNOSIS — Z800 Family history of malignant neoplasm of digestive organs: Secondary | ICD-10-CM

## 2019-02-20 DIAGNOSIS — Z808 Family history of malignant neoplasm of other organs or systems: Secondary | ICD-10-CM

## 2019-02-20 DIAGNOSIS — Z8542 Personal history of malignant neoplasm of other parts of uterus: Secondary | ICD-10-CM

## 2019-02-20 DIAGNOSIS — Z803 Family history of malignant neoplasm of breast: Secondary | ICD-10-CM

## 2019-02-20 NOTE — Progress Notes (Addendum)
HPI:  Gail Schmitt was previously seen in the [facility] clinic due to a personal and family history of cancer and concerns regarding a hereditary predisposition to cancer. Please refer to our prior cancer genetics clinic note for more information regarding our discussion, assessment and recommendations, at the time. Gail Schmitt recent genetic test results were disclosed to her, as were recommendations warranted by these results. These results and recommendations are discussed in more detail below.  CANCER HISTORY:  Oncology History   No history exists.   In 1999, at the age of 58, Gail Schmitt was diagnosed with cancer of the right breast, triple negative. The treatment plan included mastectomy and chemotherapy.   In 2004, at the age of 58, Gail Schmitt underwent TAH-BSO and reports that an endometrial cancer was discovered. She reports no other treatment for this.   FAMILY HISTORY:  We obtained a detailed, 4-generation family history.  Significant diagnoses are listed below: Family History  Problem Relation Age of Onset  . Diabetes type II Mother   . Colon polyps Mother   . Other Mother        cholangiocarcinoma  . Diabetes Sister   . Stroke Brother   . Brain cancer Paternal Aunt   . Brain cancer Paternal Grandmother   . Breast cancer Other   . Colon cancer Neg Hx    Gail Schmitt has 3 daughters, no cancers. One of her daughters, Gail Schmitt, had genetic testing that was reportedly positive. Gail Schmitt does not know any details about the gene or what testing this was, but does note that her daughter gets her breasts checked every 6 months and is seen at the cancer center. Gail Schmitt has 1 brother and 2 sisters, no cancers.   Gail Schmitt mother died at 60 and had a "very rare type" of stomach cancer. Patient's mother was an only child. Patient's maternal grandmother died "young in a shooting" and maternal grandfather died at 36. Patient's maternal grandmother's father's sister had breast  cancer in her 59s.   Gail Schmitt father is living at 15, he has not had a cancer diagnosis. Patient had 4 paternal uncles, 5 paternal aunts. One of her aunts had brain cancer, another aunt had cancer but she is unsure the type. No known cancers in paternal cousins, she does report she has limited information about this side of the family. Paternal grandmother died at 41 of brain cancer, paternal grandfather died at 56.   Gail Schmitt is unaware of previous family history of genetic testing for hereditary cancer risks. Patient's maternal ancestors are of [place], [place], [place], [place] descent. There is no reported Ashkenazi Jewish ancestry. There is no known consanguinity.  GENETIC TEST RESULTS: Genetic testing reported out on 02/20/2019 through the [facility] Multi- cancer panel found no pathogenic mutations. The Multi-Cancer Panel offered by [facility] includes sequencing and/or deletion duplication testing of the following 85 genes: AIP, ALK, APC, ATM, AXIN2,BAP1,  BARD1, BLM, BMPR1A, BRCA1, BRCA2, BRIP1, CASR, CDC73, CDH1, CDK4, CDKN1B, CDKN1C, CDKN2A (p14ARF), CDKN2A (p16INK4a), CEBPA, CHEK2, CTNNA1, DICER1, DIS3L2, EGFR (c.2369C>T, p.Thr790Met variant only), EPCAM (Deletion/duplication testing only), FH, FLCN, GATA2, GPC3, GREM1 (Promoter region deletion/duplication testing only), HOXB13 (c.251G>A, p.Gly84Glu), HRAS, KIT, MAX, MEN1, MET, MITF (c.952G>A, p.Glu318Lys variant only), MLH1, MSH2, MSH3, MSH6, MUTYH, NBN, NF1, NF2, NTHL1, PALB2, PDGFRA, PHOX2B, PMS2, POLD1, POLE, POT1, PRKAR1A, PTCH1, PTEN, RAD50, RAD51C, RAD51D, RB1, RECQL4, RET, RNF43, RUNX1, SDHAF2, SDHA (sequence changes only), SDHB, SDHC, SDHD, SMAD4, SMARCA4, SMARCB1, SMARCE1, STK11, SUFU, TERC,  TERT, TMEM127, TP53, TSC1, TSC2, VHL, WRN and WT1. The test report has been scanned into EPIC and is located under the Molecular Pathology section of the Results Review tab.  A portion of the result report is included below for reference.      We discussed with Gail Schmitt that because current genetic testing is not perfect, it is possible there may be a gene mutation in one of these genes that current testing cannot detect, but that chance is small.  We also discussed, that there could be another gene that has not yet been discovered, or that we have not yet tested, that is responsible for the cancer diagnoses in the family. It is also possible there is a hereditary cause for the cancer in the family that Gail Schmitt did not inherit and therefore was not identified in her testing.  Therefore, it is important to remain in touch with cancer genetics in the future so that we can continue to offer Gail Schmitt the most up to date genetic testing.   ADDITIONAL GENETIC TESTING: We discussed with Gail Schmitt that her genetic testing was fairly extensive.  If there are genes identified to increase cancer risk that can be analyzed in the future, we would be happy to discuss and coordinate this testing at that time.    CANCER SCREENING RECOMMENDATIONS: Gail Schmitt test result is considered negative (normal).  This means that we have not identified a hereditary cause for her personal and family history of cancer at this time. Gail Schmitt said she would forward me her daughter Gail Schmitt positive genetic test result for review so I can confirm we don't need to do any other testing for [name].   While reassuring, this does not definitively rule out a hereditary predisposition to cancer. It is still possible that there could be genetic mutations that are undetectable by current technology. There could be genetic mutations in genes that have not been tested or identified to increase cancer risk.  Therefore, it is recommended she continue to follow the cancer management and screening guidelines provided by her oncology and primary healthcare provider.   An individual's cancer risk and medical management are not determined by genetic test results alone. Overall  cancer risk assessment incorporates additional factors, including personal medical history, family history, and any available genetic information that may result in a personalized plan for cancer prevention and surveillance.  RECOMMENDATIONS FOR FAMILY MEMBERS:  Relatives in this family might be at some increased risk of developing cancer, over the general population risk, simply due to the family history of cancer.  We recommended female relatives in this family have a yearly mammogram beginning at age 74, or 47 years younger than the earliest onset of cancer, an annual clinical breast exam, and perform monthly breast self-exams. Female relatives in this family should also have a gynecological exam as recommended by their primary provider. All family members should have a colonoscopy by age 19, or as directed by their physicians.  FOLLOW-UP: Lastly, we discussed with Ms. Lymon that cancer genetics is a rapidly advancing field and it is possible that new genetic tests will be appropriate for her and/or her family members in the future. We encouraged her to remain in contact with cancer genetics on an annual basis so we can update her personal and family histories and let her know of advances in cancer genetics that may benefit this family. Additionally, I willl follow up with her once she sends me her daughter's report and  let her know if I have any other recommendations.   Our contact number was provided. Ms. Melgarejo questions were answered to her satisfaction, and she knows she is welcome to call us at anytime with additional questions or concerns.   Faith Rogue, MS, [facility] Genetic Counselor [email or web address] Phone: [phone]

## 2019-02-20 NOTE — Telephone Encounter (Signed)
Revealed negative genetic testing.  We discussed that we do not know why she has had cancer or why there is cancer in the family. It could be due to a different gene that we are not testing, or something our current technology cannot pick up.  It will be important for her to keep in contact with genetics to learn if additional testing may be needed in the future. Additionally, she did confirm that her daughter Gail Schmitt had positive genetic testing. She said she will send this to me when she gets back home from her vacation so I can review.

## 2019-03-31 ENCOUNTER — Inpatient Hospital Stay: Payer: Medicaid Other | Attending: Oncology | Admitting: Adult Health

## 2019-03-31 ENCOUNTER — Encounter: Payer: Self-pay | Admitting: Adult Health

## 2019-03-31 ENCOUNTER — Other Ambulatory Visit: Payer: Self-pay

## 2019-03-31 ENCOUNTER — Telehealth: Payer: Self-pay | Admitting: Adult Health

## 2019-03-31 ENCOUNTER — Other Ambulatory Visit: Payer: Self-pay | Admitting: *Deleted

## 2019-03-31 ENCOUNTER — Inpatient Hospital Stay: Payer: Medicaid Other

## 2019-03-31 DIAGNOSIS — Z79899 Other long term (current) drug therapy: Secondary | ICD-10-CM | POA: Diagnosis not present

## 2019-03-31 DIAGNOSIS — Z1231 Encounter for screening mammogram for malignant neoplasm of breast: Secondary | ICD-10-CM

## 2019-03-31 DIAGNOSIS — Z9012 Acquired absence of left breast and nipple: Secondary | ICD-10-CM | POA: Diagnosis not present

## 2019-03-31 DIAGNOSIS — Z853 Personal history of malignant neoplasm of breast: Secondary | ICD-10-CM | POA: Diagnosis present

## 2019-03-31 DIAGNOSIS — Z86711 Personal history of pulmonary embolism: Secondary | ICD-10-CM

## 2019-03-31 LAB — CBC WITH DIFFERENTIAL/PLATELET
Abs Immature Granulocytes: 0.01 10*3/uL (ref 0.00–0.07)
Basophils Absolute: 0.1 10*3/uL (ref 0.0–0.1)
Basophils Relative: 1 %
Eosinophils Absolute: 0.2 10*3/uL (ref 0.0–0.5)
Eosinophils Relative: 4 %
HCT: 39.3 % (ref 36.0–46.0)
Hemoglobin: 12.6 g/dL (ref 12.0–15.0)
Immature Granulocytes: 0 %
Lymphocytes Relative: 34 %
Lymphs Abs: 1.2 10*3/uL (ref 0.7–4.0)
MCH: 30.2 pg (ref 26.0–34.0)
MCHC: 32.1 g/dL (ref 30.0–36.0)
MCV: 94.2 fL (ref 80.0–100.0)
Monocytes Absolute: 0.3 10*3/uL (ref 0.1–1.0)
Monocytes Relative: 8 %
Neutro Abs: 1.9 10*3/uL (ref 1.7–7.7)
Neutrophils Relative %: 53 %
Platelets: 270 10*3/uL (ref 150–400)
RBC: 4.17 MIL/uL (ref 3.87–5.11)
RDW: 13.9 % (ref 11.5–15.5)
WBC: 3.6 10*3/uL — ABNORMAL LOW (ref 4.0–10.5)
nRBC: 0 % (ref 0.0–0.2)

## 2019-03-31 LAB — COMPREHENSIVE METABOLIC PANEL
ALT: 24 U/L (ref 0–44)
AST: 30 U/L (ref 15–41)
Albumin: 3.8 g/dL (ref 3.5–5.0)
Alkaline Phosphatase: 73 U/L (ref 38–126)
Anion gap: 12 (ref 5–15)
BUN: 14 mg/dL (ref 6–20)
CO2: 27 mmol/L (ref 22–32)
Calcium: 9.7 mg/dL (ref 8.9–10.3)
Chloride: 105 mmol/L (ref 98–111)
Creatinine, Ser: 0.81 mg/dL (ref 0.44–1.00)
GFR calc Af Amer: >60 mL/min (ref >60)
GFR calc non Af Amer: >60 mL/min (ref >60)
Glucose, Bld: 105 mg/dL — ABNORMAL HIGH (ref 70–99)
Potassium: 3.5 mmol/L (ref 3.5–5.1)
Sodium: 144 mmol/L (ref 135–145)
Total Bilirubin: 0.4 mg/dL (ref 0.3–1.2)
Total Protein: 7.6 g/dL (ref 6.5–8.1)

## 2019-03-31 NOTE — Progress Notes (Signed)
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 Per [name] notes, her cancer was T2N1bM0, ERPR negative, HER2 negative, grade 3. She was treated under [name] in [facility] with a mastectomy and axillary dissection and 4 cycles of Adriamycin and Cytoxan. 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   More recently she reported local pain along the lateral aspect of the right breast. She underwent a digital diagnostic right mammogram with tomography and an ultrasound of the right breast on 01/14/2019 showing: Breast Density Category B. There are no suspicious masses, areas of architectural distortion, areas of significant asymmetry or suspicious calcifications. No mammographic change. There is no mammographic abnormality in the region of the patient's focal breast pain. On physical exam, patient is tender to palpation along the far lateral aspect of the right breast. There is no palpable mass. Targeted ultrasound is performed, showing normal tissue throughout the lateral aspect of the right breast in the area of the patient's focal pain. No mass or suspicious lesion.  The patient's subsequent history is as detailed below.   INTERVAL HISTORY: Jacqualine was evaluated is here today for follow  up of her h/o breast cancer and regarding a concern for possible recurrence.  When she was initially diagnosed with breast cancer, she had developed focal breast pain, and then a dermato mysotis flare for a couple of years until the breast cancer was visible.  Now, over the past year she has had a right breast/axillary focal pain x1 year.  She is concerned that this history is consistent with another possible cancer recurrence.  It has become more bothersome for her, and she has undergone mammogram and ultrasound in 01/2019 that was negative.  She also has a place behind her left ear that is a skin lesion that has grown slowly.  It is painful starting behind her ear and radiates down her neck and into her shoulder.  She has undergone MRI of the cervical spine in 08/2018 that showed some degeneration and impingement.  For her pain she is taking tylenol PM which helps at night.    REVIEW OF SYSTEMS: Zahria was seen by [facility], Dr. Linward Headland on 03/11/2019.  She underwent lab testing with Dr. Dennison Mascot and that was normal, she is due to undergo MRI myositis protocol of the femurs bilaterally.  She is continuing on Methotrexate weekly.  She is due for this on 04/14/2019.  Osie is tearful because of her mobility limitation and the associated loss of independence with her rheumatologic issue.  She is also anxious about the breast cancer coming back, and that she has undergone multiple imaging and other tests that have been inconclusive which is frustrating for her. She denies any other issues today and a detailed ROS was otherwise non contributory.    PAST MEDICAL HISTORY: Past Medical History:  Diagnosis Date  . Arthritis    "  left hip; hands" (07/28/2014)  . Breast cancer, left breast ([place]) 1999   a. s/p left mastectomy  . Dermatomyositis ([place])    a. noted prior to diagnosis of Breast CA ("i'm allergic to cancer")  . 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 Family history of  adverse reaction to anesthesia    "all my daughters get real sick and throw up alot"  . Family history of brain cancer   . Family history of breast cancer   . Family history of stomach cancer   . Fatty liver   . Headache    "maybe monthly" (07/28/2014)  . Heart murmur   . Hypertension   . Mitral valve prolapse   . Normal coronary arteries    last cath 2016  . Obesity   . Pneumonia    "haven't had it in the last 4 yrs; before that I had it q yr for 4-5 years" (07/28/2014)  . Pulmonary embolism ([place]) ~ 2003  . RBBB    with symptomatic bradycardia  . Tubular adenoma of colon   . Unstable angina ([place])    a. Cath ~ 10 + yrs ago, [facility], [place] - "normal but pressures were high."  . Uterine cancer ([place]) 2007     PAST SURGICAL HISTORY: Past Surgical History:  Procedure Laterality Date  . BREAST BIOPSY Left 1999  . CARDIAC CATHETERIZATION  07/13/2011  . CHOLECYSTECTOMY N/A 07/31/2014   Procedure: LAPAROSCOPIC CHOLECYSTECTOMY WITH INTRAOPERATIVE CHOLANGIOGRAM;  Surgeon: Erroll Luna, MD;  Location: [facility];  Service: General;  Laterality: N/A;  . INGUINAL HERNIA REPAIR Left 1986  . LAPAROSCOPIC TOTAL HYSTERECTOMY    . LEFT HEART CATHETERIZATION WITH CORONARY ANGIOGRAM N/A 07/13/2011   Procedure: LEFT HEART CATHETERIZATION WITH CORONARY ANGIOGRAM;  Surgeon: Josue Hector, MD;  Location[facility] C[facility] LAB;  Service: Cardiovascular;  Laterality: N/A;  . LEFT HEART CATHETERIZATION WITH CORONARY ANGIOGRAM N/A 07/30/2014   Procedure: LEFT HEART CATHETERIZATION WITH CORONARY ANGIOGRAM;  Surgeon: [name], MD;  Location: [facility] CATH LAB;  Service: Cardiovascular;  Laterality: N/A;  . MASTECTOMY Left 1999  . RECONSTRUCTION BREAST IMMEDIATE / DELAYED W/ TISSUE EXPANDER Left 2005  . TOTAL ABDOMINAL HYSTERECTOMY  2007  . TUBAL LIGATION  1990     FAMILY HISTORY: Family History  Problem Relation Age of Onset  . Diabetes type II Mother   . Colon polyps Mother   . Other Mother         cholangiocarcinoma  . Diabetes Sister   . Stroke Brother   . Brain cancer Paternal Aunt   . Brain cancer Paternal Grandmother   . Breast cancer Other   . Colon cancer Neg Hx    Arrow's father is living as of September 2020, in his mid 85s. Patients' mother died from an "unusual stomach cancer" at the age of 4.. The patient has 1 brothers and 2 sisters.  One maternal great aunts had breast cancer.  Patient denies anyone in her family having ovarian, prostate, or pancreatic cancer.    GYNECOLOGIC HISTORY:  No LMP recorded. Patient has had a hysterectomy. 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  Her husband [name] is disabled secondary to a stroke.  It is just the 2 of them at  home.  The patient's oldest daughter Varney Daily lives in [place] and has her own business.  Daughter Ellin Saba lives in [place] and works for a bank.  Dr. Parke Poisson lives in [place] and is disabled secondary to asked Buerger's.  The patient has 4 grandchildren, no great-grandchildren.   ADVANCED DIRECTIVES: The patient tells me her daughters [name] and Leisure centre manager and are together her healthcare power of attorney   HEALTH MAINTENANCE: Social History   Tobacco Use  . Smoking status: Never Smoker  . Smokeless tobacco: Never Used  Substance Use Topics  . Alcohol use: Not Currently    Alcohol/week: 1.0 standard drinks    Types: 1 Standard drinks or equivalent per week    Comment: 07/28/2014 "might have 1 drink a couple times/month"  . Drug use: No    Colonoscopy: November 2019/ Pyrtle  PAP: STATUS post hysterectomy  Bone density: no   Allergies  Allergen Reactions  . Meloxicam Other (See Comments)    Made her hurt more  .  Percocet [Oxycodone-Acetaminophen] Itching and Rash    Current Outpatient Medications  Medication Sig Dispense Refill  . albuterol (VENTOLIN HFA) 108 (90 Base) MCG/ACT inhaler Inhale 1-2 puffs into the lungs every 6 (six) hours as needed for wheezing or shortness of breath. 8 g 0  . amLODipine (NORVASC) 5 MG tablet Take 1 tablet by mouth once daily 90 tablet 1  . budesonide-formoterol (SYMBICORT) 160-4.5 MCG/ACT inhaler Inhale 2 puffs into the lungs 2 (two) times daily.    . cyclobenzaprine (FLEXERIL) 10 MG tablet Take 10 mg by mouth every 8 (eight) hours.    . folic acid (FOLVITE) 1 MG tablet Take 1 mg by mouth daily.    . hydrochlorothiazide (HYDRODIURIL) 25 MG tablet Take 1 tablet by mouth once daily (Patient taking differently: Take 25 mg by mouth. ) 90 tablet 2  . latanoprost (XALATAN) 0.005 % ophthalmic solution INSTILL 1 DROP INTO BOTH EYES EVERY DAY AT BEDTIME    . lidocaine (LIDODERM) 5 % Place 1 patch onto the skin daily. Remove & Discard patch within 12 hours or as directed by MD 14 patch 0  . lisinopril (ZESTRIL) 40 MG tablet TAKE ONE TABLET BY MOUTH DAILY (Patient taking differently: Take 40 mg by mouth daily. ) 90 tablet 2  . loperamide (IMODIUM) 2 MG capsule Take 2 mg by mouth as needed for diarrhea or loose stools.    Marland Kitchen loratadine (CLARITIN) 10 MG tablet Take 10 mg by mouth daily.    . methotrexate (RHEUMATREX) 2.5 MG tablet Take 10 mg by mouth 2 (two) times a week. On Friday and Saturday  2  . Multiple Vitamin (MULTIVITAMIN WITH MINERALS) TABS tablet Take 1 tablet by mouth daily.    Marland Kitchen omeprazole (PRILOSEC) 20 MG capsule Take 1 capsule (20 mg total) by mouth daily. 30 capsule 0  . triamcinolone cream (KENALOG) 0.1 % APPLY TO AFFECTED AREAS TWO TIMES DAILY    . nitroGLYCERIN (NITROSTAT) 0.4 MG SL tablet Place 1 tablet (0.4 mg total) under the tongue every 5 (five) minutes as needed for chest pain. 25 tablet 3   No current facility-administered medications for this visit.       OBJECTIVE: Morbidly obese African-American woman who appears older than stated age  Vitals:   03/31/19 0920  BP: 116/68  Pulse: 87  Resp: 18  Temp: 98 F (36.7 C)  SpO2: 100%   Wt Readings from Last 3 Encounters:  03/31/19 286 lb 12.8 oz (130.1  kg)  01/30/19 287 lb 8 oz (130.4 kg)  11/25/18 282 lb (127.9 kg)   Body mass index is 47.73 kg/m.    ECOG FS:2 - Symptomatic, <50% confined to bed GENERAL: Patient is a well appearing female in no acute distress HEENT:  Sclerae anicteric.  Mask in place Neck is supple.  NODES:  No cervical, supraclavicular, or axillary lymphadenopathy palpated.  BREAST EXAM:  No mass palpated in either breast LUNGS:  Clear to auscultation bilaterally.  No wheezes or rhonchi. HEART:  Regular rate and rhythm. No murmur appreciated. ABDOMEN:  Soft, nontender.  Positive, normoactive bowel sounds. No organomegaly palpated. MSK:  No focal spinal tenderness to palpation. EXTREMITIES:  No peripheral edema.   SKIN:  Clear with no obvious rashes or skin changes. No nail dyscrasia. NEURO:  Nonfocal. Well oriented.  Appropriate affect. Tearful throughout exam     LAB RESULTS:  CMP     Component Value Date/Time   NA 144 03/31/2019 0849   K 3.5 03/31/2019 0849   CL 105 03/31/2019 0849   CO2 27 03/31/2019 0849   GLUCOSE 105 (H) 03/31/2019 0849   BUN 14 03/31/2019 0849   CREATININE 0.81 03/31/2019 0849   CREATININE 0.71 02/21/2016 1119   CALCIUM 9.7 03/31/2019 0849   PROT 7.6 03/31/2019 0849   ALBUMIN 3.8 03/31/2019 0849   AST 30 03/31/2019 0849   ALT 24 03/31/2019 0849   ALKPHOS 73 03/31/2019 0849   BILITOT 0.4 03/31/2019 0849   GFRNONAA >60 03/31/2019 0849   GFRNONAA >89 07/04/2014 1042   GFRAA >60 03/31/2019 0849   GFRAA >89 07/04/2014 1042    No results found for: TOTALPROTELP, ALBUMINELP, A1GS, A2GS, BETS, BETA2SER, GAMS, MSPIKE, SPEI  No results found for: KPAFRELGTCHN, LAMBDASER, KAPLAMBRATIO  Lab Results  Component Value Date   WBC  3.6 (L) 03/31/2019   NEUTROABS 1.9 03/31/2019   HGB 12.6 03/31/2019   HCT 39.3 03/31/2019   MCV 94.2 03/31/2019   PLT 270 03/31/2019    No results found for: LABCA2  No components found for: [id number]  No results for input(s): INR in the last 168 hours.  No results found for: LABCA2  No results found for: CAN199  No results found for: GEX528  No results found for: UXL244  Lab Results  Component Value Date   CA2729 30.2 02/11/2019    No components found for: HGQUANT  No results found for: CEA1 / No results found for: CEA1   No results found for: AFPTUMOR  No results found for: [place]  No results found for: PSA1  Appointment on 03/31/2019  Component Date Value Ref Range Status  . Sodium 03/31/2019 144  135 - 145 mmol/L Final  . Potassium 03/31/2019 3.5  3.5 - 5.1 mmol/L Final  . Chloride 03/31/2019 105  98 - 111 mmol/L Final  . CO2 03/31/2019 27  22 - 32 mmol/L Final  . Glucose, Bld 03/31/2019 105* 70 - 99 mg/dL Final  . BUN 03/31/2019 14  6 - 20 mg/dL Final  . Creatinine, Ser 03/31/2019 0.81  0.44 - 1.00 mg/dL Final  . Calcium 03/31/2019 9.7  8.9 - 10.3 mg/dL Final  . Total Protein 03/31/2019 7.6  6.5 - 8.1 g/dL Final  . Albumin 03/31/2019 3.8  3.5 - 5.0 g/dL Final  . AST 03/31/2019 30  15 - 41 U/L Final  . ALT 03/31/2019 24  0 - 44 U/L Final  . Alkaline Phosphatase 03/31/2019 73  38 - 126 U/L Final  . Total  Bilirubin 03/31/2019 0.4  0.3 - 1.2 mg/dL Final  . GFR calc non Af Amer 03/31/2019 >60  >60 mL/min Final  . GFR calc Af Amer 03/31/2019 >60  >60 mL/min Final  . Anion gap 03/31/2019 12  5 - 15 Final   Performed at [facility], [place] [address] [place], [place] [address]  . WBC 03/31/2019 3.6* 4.0 - 10.5 K/uL Final  . RBC 03/31/2019 4.17  3.87 - 5.11 MIL/uL Final  . Hemoglobin 03/31/2019 12.6  12.0 - 15.0 g/dL Final  . HCT 03/31/2019 39.3  36.0 - 46.0 % Final  . MCV 03/31/2019 94.2  80.0 - 100.0 fL Final  . MCH 03/31/2019  30.2  26.0 - 34.0 pg Final  . MCHC 03/31/2019 32.1  30.0 - 36.0 g/dL Final  . RDW 03/31/2019 13.9  11.5 - 15.5 % Final  . Platelets 03/31/2019 270  150 - 400 K/uL Final  . nRBC 03/31/2019 0.0  0.0 - 0.2 % Final  . Neutrophils Relative % 03/31/2019 53  % Final  . Neutro Abs 03/31/2019 1.9  1.7 - 7.7 K/uL Final  . Lymphocytes Relative 03/31/2019 34  % Final  . Lymphs Abs 03/31/2019 1.2  0.7 - 4.0 K/uL Final  . Monocytes Relative 03/31/2019 8  % Final  . Monocytes Absolute 03/31/2019 0.3  0.1 - 1.0 K/uL Final  . Eosinophils Relative 03/31/2019 4  % Final  . Eosinophils Absolute 03/31/2019 0.2  0.0 - 0.5 K/uL Final  . Basophils Relative 03/31/2019 1  % Final  . Basophils Absolute 03/31/2019 0.1  0.0 - 0.1 K/uL Final  . Immature Granulocytes 03/31/2019 0  % Final  . Abs Immature Granulocytes 03/31/2019 0.01  0.00 - 0.07 K/uL Final   Performed at [facility], [address] [place], [place] [address]    (this displays the last labs from the last 3 days)  No results found for: TOTALPROTELP, ALBUMINELP, A1GS, A2GS, BETS, BETA2SER, GAMS, MSPIKE, SPEI (this displays SPEP labs)  No results found for: KPAFRELGTCHN, LAMBDASER, KAPLAMBRATIO (kappa/lambda light chains)  No results found for: HGBA, HGBA2QUANT, HGBFQUANT, HGBSQUAN (Hemoglobinopathy evaluation)   No results found for: LDH  No results found for: IRON, TIBC, IRONPCTSAT (Iron and TIBC)  No results found for: FERRITIN  Urinalysis    Component Value Date/Time   LABSPEC >=1.030 09/18/2017 1123   PHURINE 6.5 09/18/2017 1123   GLUCOSEU NEGATIVE 09/18/2017 1123   HGBUR NEGATIVE 09/18/2017 [place] 09/18/2017 1123   BILIRUBINUR negative 04/09/2016 1803   BILIRUBINUR Negative 08/20/2014 1444   KETONESUR NEGATIVE 09/18/2017 1123   PROTEINUR NEGATIVE 09/18/2017 1123   UROBILINOGEN 0.2 09/18/2017 1123   NITRITE NEGATIVE 09/18/2017 1123   LEUKOCYTESUR TRACE (A) 09/18/2017 1123      STUDIES:  No results found.   ELIGIBLE FOR AVAILABLE RESEARCH PROTOCOL: No   ASSESSMENT: 58 y.o. [place], [place] woman with a long history of dermatomyositis, as well as breast cancer, as follows  (1) status post left mastectomy and axillary lymph node dissection 1999 for a T2 N1b, stage IIIB invasive breast cancer, triple negative  (a) status post cyclophosphamide and doxorubicin x4  (b) did not receive adjuvant radiation secondary to history of myositis  (2) status post hysterectomy and bilateral salpingo-oophorectomy 2004 for what seems to have been a stage I endometrial carcinoma  (3) Genetic testing reported out on 02/20/2019 through the [facility] Multi- cancer panel found no pathogenic mutations. The Multi-Cancer Panel offered by [facility] includes  sequencing and/or deletion duplication testing of the following 85 genes: AIP, ALK, APC, ATM, AXIN2,BAP1,  BARD1, BLM, BMPR1A, BRCA1, BRCA2, BRIP1, CASR, CDC73, CDH1, CDK4, CDKN1B, CDKN1C, CDKN2A (p14ARF), CDKN2A (p16INK4a), CEBPA, CHEK2, CTNNA1, DICER1, DIS3L2, EGFR (c.2369C>T, p.Thr790Met variant only), EPCAM (Deletion/duplication testing only), FH, FLCN, GATA2, GPC3, GREM1 (Promoter region deletion/duplication testing only), HOXB13 (c.251G>A, p.Gly84Glu), HRAS, KIT, MAX, MEN1, MET, MITF (c.952G>A, p.Glu318Lys variant only), MLH1, MSH2, MSH3, MSH6, MUTYH, NBN, NF1, NF2, NTHL1, PALB2, PDGFRA, PHOX2B, PMS2, POLD1, POLE, POT1, PRKAR1A, PTCH1, PTEN, RAD50, RAD51C, RAD51D, RB1, RECQL4, RET, RNF43, RUNX1, SDHAF2, SDHA (sequence changes only), SDHB, SDHC, SDHD, SMAD4, SMARCA4, SMARCB1, SMARCE1, STK11, SUFU, TERC, TERT, TMEM127, TP53, TSC1, TSC2, VHL, WRN and WT1.  PLAN:  Talulah's situation is certainly perplexing.  Her history is compelling and we are concerned.  Her imaging thus far is inconclusive.  Her physical exam reveals no palpable abnormalities.  Given her age and family history, we talked about how to monitor the current situation moving forward.   We will do breast MRI in 07/2019, and after that she will see Dr. Jana Hakim.  Should her breast worsen in regards to pain, or mass development she will call us as we can move the MRI up if needed.    Naryah was recommended to continue to f/u with rheumatology and her MRI to look into her myositis diagnosis.  We talked further about her challenges facing her diagnosis and her mobility and independence limitations, and she was tearful and upset.  I reviewed with her that it is very understandable to be upset and frustrated at this time period.  Right now we will monitor and follow her closely, and support her through keeping a close eye on things.   Raneem has a good understanding of the overall plan. She agrees with it. She was recommended to continue with the appropriate pandemic precautions. She knows to call for any questions that may arise between now and her next appointment.  We are happy to see her sooner if needed.  A total of (30) minutes of face-to-face time was spent with this patient with greater than 50% of that time in counseling and care-coordination.  Wilber Bihari, NP 03/31/19 10:02 AM Medical Oncology and H[facility] [place], [place] [address] Tel. [phone]    Fax. [phone]

## 2019-03-31 NOTE — Telephone Encounter (Signed)
I talk with patient regarding schedule  

## 2019-04-01 LAB — CANCER ANTIGEN 27.29: CA 27.29: 31.1 U/mL (ref 0.0–38.6)

## 2019-04-16 ENCOUNTER — Encounter: Payer: Self-pay | Admitting: Oncology

## 2019-04-16 ENCOUNTER — Ambulatory Visit: Payer: Medicaid Other | Admitting: Cardiovascular Disease

## 2019-04-16 ENCOUNTER — Encounter: Payer: Self-pay | Admitting: Cardiovascular Disease

## 2019-04-16 ENCOUNTER — Other Ambulatory Visit: Payer: Self-pay

## 2019-04-16 VITALS — BP 96/70 | HR 96 | Temp 97.2°F | Ht 67.0 in | Wt 288.0 lb

## 2019-04-16 DIAGNOSIS — M3390 Dermatopolymyositis, unspecified, organ involvement unspecified: Secondary | ICD-10-CM | POA: Diagnosis not present

## 2019-04-16 DIAGNOSIS — I739 Peripheral vascular disease, unspecified: Secondary | ICD-10-CM

## 2019-04-16 DIAGNOSIS — R071 Chest pain on breathing: Secondary | ICD-10-CM | POA: Diagnosis not present

## 2019-04-16 DIAGNOSIS — I10 Essential (primary) hypertension: Secondary | ICD-10-CM | POA: Diagnosis not present

## 2019-04-16 MED ORDER — AMLODIPINE BESYLATE 2.5 MG PO TABS: 2.5000 mg | ORAL_TABLET | Freq: Every day | ORAL | 3 refills | Status: DC

## 2019-04-16 NOTE — Progress Notes (Signed)
Cardiology Office Note:    Date:  04/16/2019   ID:  Gail Schmitt, DOB 11-23-1960, MRN [id number]  PCP:  [name], MD  Cardiologist:  Sanda Klein, MD  Electrophysiologist:  None   Referring MD: [name], MD   Chief Complaint  Patient presents with  . Follow-up  . Shortness of Breath    Ankles and right hand.  chest pain; echo  History of Present Illness:    Gail Schmitt is a 58 y.o. 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  She was hospitalized in August 2019 with chest pain.  Cardiac enzymes were normal.  Coronary CT angiography was negative for pulmonary embolism.  The coronary calcium score was 0.  Echocardiogram showed borderline LVEF of 50% with mild LVH, but without overt diastolic dysfunction.  Echo did not confirm the reported diagnosis of mitral valve prolapse.  Recently she has had some problems with pleuritic chest discomfort and cysts "hurts breathing out"), but her rheumatologist told her that her tests were okay.  About a week ago shopping she had a brief episode of diaphoresis and feeling faint that resolved just by resting.  She states that the lower half of her left leg always feels either weak or numb and that the symptoms worsen with walking.  If she pushes herself to be pain and numbness progresses to involve the entire left leg and sometimes the right leg as well.  She has occasional ankle swelling but this resolves after overnight supine position.  Rheumatology follow-up has shown recurrence of dermatomyositis (Dr. Annamaria Boots).  Her chest pain has improved once she began treatment with steroids, which has now been transitioned to methotrexate.  Multiple joint complaints have also  improved and she is no longer walking with a cane and is about to start physical therapy.  Recently performed lab tests included normal CK and aldolase, normal CBC, normal chemistry profile and liver function test (with the exception of a potassium 3.3), mildly elevated CRP 1.26 and ESR 30.  MRI did not show any evidence of active myositis/inflammatory process in her legs.  She returns in follow-up and feels well.  No current complaints of chest pain or dyspnea.  Past Medical History:  Diagnosis Date  . Arthritis    "left hip; hands" (07/28/2014)  . Breast cancer, left breast ([place]) 1999   a. s/p left mastectomy  . Dermatomyositis ([place])    a. noted prior to diagnosis of Breast CA ("i'm allergic to cancer")  . 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 Family history of adverse reaction to anesthesia    "all my daughters get real sick and throw up alot"  . Family history of brain cancer   . Family history of breast cancer   . Family history of stomach cancer   . Fatty liver   . Headache    "maybe monthly" (07/28/2014)  . Heart murmur   . Hypertension   . Mitral valve prolapse   . Normal coronary arteries    last cath 2016  . Obesity   . Pneumonia    "haven't had it in the last 4 yrs; before that I had it q yr for 4-5 years" (07/28/2014)  . Pulmonary embolism ([place]) ~ 2003  . RBBB    with  symptomatic bradycardia  . Tubular adenoma of colon   . Unstable angina ([place])    a. Cath ~ 10 + yrs ago, [facility], [place] - "normal but pressures were high."  . Uterine cancer ([place]) 2007    Past Surgical History:  Procedure Laterality Date  . BREAST BIOPSY Left 1999  . CARDIAC CATHETERIZATION  07/13/2011  . CHOLECYSTECTOMY N/A 07/31/2014   Procedure: LAPAROSCOPIC CHOLECYSTECTOMY WITH INTRAOPERATIVE CHOLANGIOGRAM;  Surgeon: Erroll Luna, MD;  Location: [facility];  Service: General;  Laterality: N/A;  . INGUINAL HERNIA REPAIR Left 1986  . LAPAROSCOPIC TOTAL HYSTERECTOMY    . LEFT  HEART CATHETERIZATION WITH CORONARY ANGIOGRAM N/A 07/13/2011   Procedure: LEFT HEART CATHETERIZATION WITH CORONARY ANGIOGRAM;  Surgeon: Josue Hector, MD;  Location[facility] C[facility] LAB;  Service: Cardiovascular;  Laterality: N/A;  . LEFT HEART CATHETERIZATION WITH CORONARY ANGIOGRAM N/A 07/30/2014   Procedure: LEFT HEART CATHETERIZATION WITH CORONARY ANGIOGRAM;  Surgeon: [name], MD;  Location: [facility] CATH LAB;  Service: Cardiovascular;  Laterality: N/A;  . MASTECTOMY Left 1999  . RECONSTRUCTION BREAST IMMEDIATE / DELAYED W/ TISSUE EXPANDER Left 2005  . TOTAL ABDOMINAL HYSTERECTOMY  2007  . TUBAL LIGATION  1990    Current Medications: Current Meds  Medication Sig  . albuterol (VENTOLIN HFA) 108 (90 Base) MCG/ACT inhaler Inhale 1-2 puffs into the lungs every 6 (six) hours as needed for wheezing or shortness of breath.  Marland Kitchen amLODipine (NORVASC) 2.5 MG tablet Take 1 tablet (2.5 mg total) by mouth daily.  . budesonide-formoterol (SYMBICORT) 160-4.5 MCG/ACT inhaler Inhale 2 puffs into the lungs 2 (two) times daily.  . cyclobenzaprine (FLEXERIL) 10 MG tablet Take 10 mg by mouth every 8 (eight) hours.  . folic acid (FOLVITE) 1 MG tablet Take 1 mg by mouth daily.  . hydrochlorothiazide (HYDRODIURIL) 25 MG tablet Take 1 tablet by mouth once daily (Patient taking differently: Take 25 mg by mouth. )  . latanoprost (XALATAN) 0.005 % ophthalmic solution INSTILL 1 DROP INTO BOTH EYES EVERY DAY AT BEDTIME  . lidocaine (LIDODERM) 5 % Place 1 patch onto the skin daily. Remove & Discard patch within 12 hours or as directed by MD  . lisinopril (ZESTRIL) 40 MG tablet TAKE ONE TABLET BY MOUTH DAILY (Patient taking differently: Take 40 mg by mouth daily. )  . loperamide (IMODIUM) 2 MG capsule Take 2 mg by mouth as needed for diarrhea or loose stools.  Marland Kitchen loratadine (CLARITIN) 10 MG tablet Take 10 mg by mouth daily.  . methotrexate (RHEUMATREX) 2.5 MG tablet Take 10 mg by mouth 2 (two) times a week. On Friday and  Saturday  . Multiple Vitamin (MULTIVITAMIN WITH MINERALS) TABS tablet Take 1 tablet by mouth daily.  Marland Kitchen omeprazole (PRILOSEC) 20 MG capsule Take 1 capsule (20 mg total) by mouth daily.  Marland Kitchen triamcinolone cream (KENALOG) 0.1 % APPLY TO AFFECTED AREAS TWO TIMES DAILY  . [DISCONTINUED] amLODipine (NORVASC) 5 MG tablet Take 1 tablet by mouth once daily     Allergies:   Meloxicam and Percocet [oxycodone-acetaminophen]   Social History   Socioeconomic History  . Marital status: Divorced    Spouse name: Not on file  . Number of children: 3  . Years of education: Not on file  . Highest education level: Not on file  Occupational History  . 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 Smoking status: Never Smoker  . Smokeless tobacco: Never Used  Substance and Sexual Activity  . Alcohol use: Not Currently  Alcohol/week: 1.0 standard drinks    Types: 1 Standard drinks or equivalent per week    Comment: 07/28/2014 "might have 1 drink a couple times/month"  . Drug use: No  . Sexual activity: Not Currently  Other Topics Concern  . Not on file  Social History Narrative   Lives @ home in [place] with 1 of her 3 dtrs.  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  Doesn't follow any particular diet.   Social Determinants of Health   Financial Resource Strain:   . Difficulty of Paying Living Expenses: Not on file  Food Insecurity:   . Worried About Charity fundraiser in the Last Year: Not on file  . Ran Out of Food in the Last Year: Not on file  Transportation Needs:   . Lack of Transportation (Medical): Not on file  . Lack of Transportation (Non-Medical): Not on file  Physical Activity:   . Days of Exercise per Week: Not on file  . Minutes of Exercise per Session: Not on file  Stress:   . Feeling of Stress : Not on file  Social Connections:   . Frequency of Communication with Friends and Family: Not on file  . Frequency of Social  Gatherings with Friends and Family: Not on file  . Attends Religious Services: Not on file  . Active Member of Clubs or Organizations: Not on file  . Attends Archivist Meetings: Not on file  . Marital Status: Not on file     Family History: The patient's family history includes Brain cancer in her paternal aunt and paternal grandmother; Breast cancer in an other family member; Colon polyps in her mother; Diabetes in her sister; Diabetes type II in her mother; Other in her mother; Stroke in her brother. There is no history of Colon cancer.  ROS:   Please see the history of present illness.     All other systems reviewed and are negative.  EKGs/Labs/Other Studies Reviewed:    The following studies were reviewed today: Chest CT and echo from August 2019  EKG:  EKG is ordered today.  It shows normal sinus rhythm with nonspecific T wave changes, similar to the previous tracing, QTC 457 ms.  Recent Labs: 03/31/2019: ALT 24; BUN 14; Creatinine, Ser 0.81; Hemoglobin 12.6; Platelets 270; Potassium 3.5; Sodium 144  Recent Lipid Panel    Component Value Date/Time   CHOL 163 12/20/2017 0550   TRIG 56 12/20/2017 0550   HDL 47 12/20/2017 0550   CHOLHDL 3.5 12/20/2017 0550   VLDL 11 12/20/2017 0550   LDLCALC 105 (H) 12/20/2017 0550    Physical Exam:    VS:  BP 96/70 (BP Location: Right Arm, Patient Position: Sitting, Cuff Size: Large)   Pulse 96   Temp (!) 97.2 F (36.2 C)   Ht 5' 7"  (1.702 m)   Wt 288 lb (130.6 kg)   BMI 45.11 kg/m     Wt Readings from Last 3 Encounters:  04/16/19 288 lb (130.6 kg)  03/31/19 286 lb 12.8 oz (130.1 kg)  01/30/19 287 lb 8 oz (130.4 kg)     General: Alert, oriented x3, no distress, morbid obesity Head: no evidence of trauma, PERRL, EOMI, no exophtalmos or lid lag, no myxedema, no xanthelasma; normal ears, nose and oropharynx Neck: normal jugular venous pulsations and no hepatojugular reflux; brisk carotid pulses without delay and no  carotid bruits Chest: clear to auscultation, no signs  of consolidation by percussion or palpation, normal fremitus, symmetrical and full respiratory excursions Cardiovascular: normal position and quality of the apical impulse, regular rhythm, normal first and second heart sounds, no murmurs, rubs or gallops Abdomen: no tenderness or distention, no masses by palpation, no abnormal pulsatility or arterial bruits, normal bowel sounds, no hepatosplenomegaly Extremities: no clubbing, cyanosis; 1-2+ ankle edema; 2+ radial, ulnar and brachial pulses bilaterally; 2+ right femoral, posterior tibial and dorsalis pedis pulses; 2+ left femoral, posterior tibial and dorsalis pedis pulses; no subclavian or femoral bruits Neurological: grossly nonfocal Psych: Normal mood and affect   ASSESSMENT:    1. Left leg claudication (HCC)   2. Chest pain on breathing   3. Essential hypertension   4. Dermatomyositis ([place])    PLAN:    In order of problems listed above:  1. Chest pain: Previously resolved with treatment for rheumatological disorder.  She has pleuritic symptoms (not particularly severe), mild evidence of inflammation based on CRP and ESR.  Extensive previous coronary work-up was negative.  No sign of pericarditis on EKG. 2. HTN: Excessively well controlled with recent near syncopal events after standing for long period of time.  She has some ankle edema.  We will cut her amlodipine dose to 2.5 mg once daily. 3. Leg pain: Doubt vascular cause, suspect she may have neurogenic claudication.  Check ABI to be sure. 4. Dermatomyositis: Now seeing Dr. Dennison Mascot  Medication Adjustments/Labs and Tests Ordered: Current medicines are reviewed at length with the patient today.  Concerns regarding medicines are outlined above.  Orders Placed This Encounter  Procedures  . VAS [place] ABI WITH/WO TBI   Meds ordered this encounter  Medications  . amLODipine (NORVASC) 2.5 MG tablet    Sig: Take 1 tablet (2.5 mg  total) by mouth daily.    Dispense:  90 tablet    Refill:  3    Patient Instructions  Medication Instructions:  DECREASE the Amlodipine 2.5 mg once daily  *If you need a refill on your cardiac medications before your next appointment, please call your pharmacy*  Lab Work: None ordered If you have labs (blood work) drawn today and your tests are completely normal, you will receive your results only by: Marland Kitchen MyChart Message (if you have MyChart) OR . A paper copy in the mail If you have any lab test that is abnormal or we need to change your treatment, we will call you to review the results.  Testing/Procedures: Your physician has requested that you have an ankle brachial index (ABI). During this test an ultrasound and blood pressure cuff are used to evaluate the arteries that supply the arms and legs with blood. Allow thirty minutes for this exam. There are no restrictions or special instructions. This will take place at [place], [address].   Follow-Up: At [facility], you and your health needs are our priority.  As part of our continuing mission to provide you with exceptional heart care, we have created designated Provider Care Teams.  These Care Teams include your primary Cardiologist (physician) and Advanced Practice Providers (APPs -  Physician Assistants and Nurse Practitioners) who all work together to provide you with the care you need, when you need it.  Your next appointment:   12 month(s)  The format for your next appointment:   In Person  Provider:   Sanda Klein, MD      Signed, Sanda Klein, MD  04/16/2019 1:42 PM    [place]

## 2019-04-16 NOTE — Patient Instructions (Signed)
Medication Instructions:  DECREASE the Amlodipine 2.5 mg once daily  *If you need a refill on your cardiac medications before your next appointment, please call your pharmacy*  Lab Work: None ordered If you have labs (blood work) drawn today and your tests are completely normal, you will receive your results only by: Marland Kitchen MyChart Message (if you have MyChart) OR . A paper copy in the mail If you have any lab test that is abnormal or we need to change your treatment, we will call you to review the results.  Testing/Procedures: Your physician has requested that you have an ankle brachial index (ABI). During this test an ultrasound and blood pressure cuff are used to evaluate the arteries that supply the arms and legs with blood. Allow thirty minutes for this exam. There are no restrictions or special instructions. This will take place at [place], [address].   Follow-Up: At [facility], you and your health needs are our priority.  As part of our continuing mission to provide you with exceptional heart care, we have created designated Provider Care Teams.  These Care Teams include your primary Cardiologist (physician) and Advanced Practice Providers (APPs -  Physician Assistants and Nurse Practitioners) who all work together to provide you with the care you need, when you need it.  Your next appointment:   12 month(s)  The format for your next appointment:   In Person  Provider:   Sanda Klein, MD

## 2019-04-20 ENCOUNTER — Other Ambulatory Visit (HOSPITAL_COMMUNITY): Payer: Self-pay | Admitting: Cardiovascular Disease

## 2019-04-20 DIAGNOSIS — I739 Peripheral vascular disease, unspecified: Secondary | ICD-10-CM

## 2019-04-24 ENCOUNTER — Other Ambulatory Visit: Payer: Self-pay | Admitting: Cardiology

## 2019-04-24 DIAGNOSIS — Z20822 Contact with and (suspected) exposure to covid-19: Secondary | ICD-10-CM

## 2019-04-25 LAB — NOVEL CORONAVIRUS, NAA: SARS-CoV-2, NAA: NOT DETECTED

## 2019-04-28 ENCOUNTER — Ambulatory Visit (HOSPITAL_COMMUNITY)
Admission: RE | Admit: 2019-04-28 | Discharge: 2019-04-28 | Disposition: A | Discharge status: Home / Self Care | Payer: Medicaid Other | Source: Ambulatory Visit | Attending: Cardiology | Admitting: Cardiology

## 2019-04-28 ENCOUNTER — Other Ambulatory Visit: Payer: Self-pay

## 2019-04-28 DIAGNOSIS — I739 Peripheral vascular disease, unspecified: Secondary | ICD-10-CM | POA: Diagnosis present

## 2019-05-21 ENCOUNTER — Ambulatory Visit: Payer: Medicaid Other | Attending: Internal Medicine

## 2019-05-21 DIAGNOSIS — Z20822 Contact with and (suspected) exposure to covid-19: Secondary | ICD-10-CM

## 2019-05-23 LAB — NOVEL CORONAVIRUS, NAA: SARS-CoV-2, NAA: NOT DETECTED

## 2019-06-22 ENCOUNTER — Telehealth: Payer: Self-pay | Admitting: *Deleted

## 2019-06-22 MED ORDER — LORAZEPAM 1 MG PO TABS: ORAL_TABLET | ORAL | 1 refills | Status: AC

## 2019-06-22 MED ORDER — LORAZEPAM 1 MG PO TABS: ORAL_TABLET | ORAL | 1 refills | Status: DC

## 2019-06-22 NOTE — Telephone Encounter (Addendum)
Pt called to this RN to state need for " something for scan Dr Jana Hakim ordered- I am claustrophobic.  This RN obtained order - as well as instructed pt she must have a driver- with pt verifying she does.

## 2019-06-24 ENCOUNTER — Other Ambulatory Visit: Payer: Self-pay

## 2019-06-24 ENCOUNTER — Ambulatory Visit (HOSPITAL_COMMUNITY)
Admission: RE | Admit: 2019-06-24 | Discharge: 2019-06-24 | Disposition: A | Discharge status: Home / Self Care | Payer: Medicaid Other | Source: Ambulatory Visit | Attending: Adult Health | Admitting: Adult Health

## 2019-06-24 DIAGNOSIS — Z1231 Encounter for screening mammogram for malignant neoplasm of breast: Secondary | ICD-10-CM

## 2019-06-24 MED ORDER — GADOBUTROL 1 MMOL/ML IV SOLN
10.0000 mL | Freq: Once | INTRAVENOUS | Status: AC | PRN
  Administered 2019-06-24: 18:00:00 10 mL via INTRAVENOUS

## 2019-06-30 ENCOUNTER — Telehealth: Payer: Self-pay

## 2019-06-30 NOTE — Telephone Encounter (Signed)
TC to pt per Mendel Ryder NP to let her know that MRI shows no cancer. Good results. unable to reach patient with number that was on file. Was able to reach her daughter whose number is on file who verbalized understanding and stated that she would get this message to her mother.

## 2019-06-30 NOTE — Telephone Encounter (Signed)
duplicate

## 2019-07-08 ENCOUNTER — Other Ambulatory Visit: Payer: Self-pay | Admitting: Cardiovascular Disease

## 2019-07-29 NOTE — Progress Notes (Signed)
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  She looks considerably improved as compared to her initial visit with me.  She feels that she was pretty lost at that time with no one paying attention to her symptoms but now she is being followed by rheumatology and neurology at [facility] and she feels she is finally getting somewhere.  Since her last visit, she underwent breast MRI on 06/24/2019 showing: breast composition B; no evidence for malignancy in either breast; no abnormal-appearing lymph nodes. She will be due for annual screening mammogram in 01/2020.   REVIEW OF SYSTEMS: Gail Schmitt uses a Rollator.  She has had her first [place] vaccine dose and will have the second 1 on 08/16/2019.  She had no problems with the first 1.  She and her family are taking appropriate pandemic precautions.  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  She tells me she may have significant spinal stenosis.  A detailed review of systems today was otherwise stable.    HISTORY OF CURRENT ILLNESS: From the original intake note:  Gail Schmitt has a history of left breast cancer diagnosed in 1999. Per [name] notes, her cancer was T2N1bM0, ERPR negative, HER2 negative, grade 3. She was treated under [name] in [facility] with a mastectomy and axillary dissection and 4 cycles of Adriamycin and Cytoxan. 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   More recently she reported local pain along the lateral aspect of the right breast. She underwent a digital diagnostic right mammogram with tomography and an ultrasound of the right breast on 01/14/2019 showing: Breast Density Category B. There are no suspicious masses, areas of architectural distortion, areas of significant asymmetry or suspicious calcifications. No mammographic change. There is no mammographic abnormality in the region of the patient's focal breast pain. On physical exam, patient is tender to palpation along the far lateral aspect of the right breast. There is no palpable mass. Targeted ultrasound is performed, showing normal tissue throughout the lateral aspect of the right breast in the area of the patient's focal pain. No mass or suspicious lesion.  The patient's subsequent history is as detailed below.   PAST MEDICAL HISTORY: Past Medical History:  Diagnosis Date  . Arthritis    "left hip; hands" (07/28/2014)  . Breast cancer, left breast ([place]) 1999   a. s/p left mastectomy  . Dermatomyositis ([place])    a. noted prior to diagnosis of Breast CA ("i'm allergic to cancer")  . 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 Family history of adverse reaction to anesthesia    "all my daughters get real sick and throw up alot"  . Family history of brain cancer   . Family history of breast cancer   . Family history of stomach cancer   . Fatty liver   .  Headache    "maybe monthly" (07/28/2014)  . Heart murmur   . Hypertension   . Mitral valve prolapse   . Normal coronary arteries    last cath 2016  . Obesity   . Pneumonia    "haven't had it in the last 4 yrs; before that I had it q yr for 4-5 years" (07/28/2014)  . Pulmonary embolism ([place]) ~ 2003  . RBBB    with symptomatic  bradycardia  . Tubular adenoma of colon   . Unstable angina ([place])    a. Cath ~ 10 + yrs ago, [facility], [place] - "normal but pressures were high."  . Uterine cancer ([place]) 2007    PAST SURGICAL HISTORY: Past Surgical History:  Procedure Laterality Date  . BREAST BIOPSY Left 1999  . CARDIAC CATHETERIZATION  07/13/2011  . CHOLECYSTECTOMY N/A 07/31/2014   Procedure: LAPAROSCOPIC CHOLECYSTECTOMY WITH INTRAOPERATIVE CHOLANGIOGRAM;  Surgeon: Erroll Luna, MD;  Location: [place];  Service: General;  Laterality: N/A;  . INGUINAL HERNIA REPAIR Left 1986  . LAPAROSCOPIC TOTAL HYSTERECTOMY    . LEFT HEART CATHETERIZATION WITH CORONARY ANGIOGRAM N/A 07/13/2011   Procedure: LEFT HEART CATHETERIZATION WITH CORONARY ANGIOGRAM;  Surgeon: Josue Hector, MD;  Location: [facility] CATH LAB;  Service: Cardiovascular;  Laterality: N/A;  . LEFT HEART CATHETERIZATION WITH CORONARY ANGIOGRAM N/A 07/30/2014   Procedure: LEFT HEART CATHETERIZATION WITH CORONARY ANGIOGRAM;  Surgeon: [name], MD;  Location[facility];  Service: Cardiovascular;  Laterality: N/A;  . MASTECTOMY Left 1999  . RECONSTRUCTION BREAST IMMEDIATE / DELAYED W/ TISSUE EXPANDER Left 2005  . TOTAL ABDOMINAL HYSTERECTOMY  2007  . TUBAL LIGATION  1990    FAMILY HISTORY: Family History  Problem Relation Age of Onset  . Diabetes type II Mother   . Colon polyps Mother   . Other Mother        cholangiocarcinoma  . Diabetes Sister   . Stroke Brother   . Brain cancer Paternal Aunt   . Brain cancer Paternal Grandmother   . Breast cancer Other   . Colon cancer Neg Hx    Gibson's father is living as of September 2020, in his mid 3s. Patients' mother died from an "unusual stomach cancer" at the age of 65.. The patient has 1 brothers and 2 sisters.  One maternal great aunts had breast cancer.  Patient denies anyone in her family having ovarian, prostate, or pancreatic cancer.    GYNECOLOGIC HISTORY:  No LMP recorded. Patient has had a  hysterectomy. 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  Her husband [name] is disabled secondary to a stroke.  It is just the 2 of them at home.  The patient's oldest daughter Gail Schmitt lives in [place] and has her own business.  Daughter Gail Schmitt lives in [place] and works for a bank.  Dr. Parke Poisson lives in [place] and is disabled secondary to asked [employer]ger's.  The patient has 4 grandchildren, no great-grandchildren.   ADVANCED DIRECTIVES: The patient tells me her daughters [name] and Leisure centre manager and are together her healthcare power of attorney   HEALTH MAINTENANCE: Social History   Tobacco Use  . Smoking status: Never Smoker  . Smokeless tobacco: Never Used  Substance Use Topics  .  Alcohol use: Not Currently    Alcohol/week: 1.0 standard drinks    Types: 1 Standard drinks or equivalent per week    Comment: 07/28/2014 "might have 1 drink a couple times/month"  . Drug use: No    Colonoscopy: November 2019/ Pyrtle  PAP: STATUS post hysterectomy  Bone density: no   Allergies  Allergen Reactions  . Meloxicam Other (See Comments)    Made her hurt more  . Percocet [Oxycodone-Acetaminophen] Itching and Rash    Current Outpatient Medications  Medication Sig Dispense Refill  . albuterol (VENTOLIN HFA) 108 (90 Base) MCG/ACT inhaler Inhale 1-2 puffs into the lungs every 6 (six) hours as needed for wheezing or shortness of breath. 8 g 0  . amLODipine (NORVASC) 2.5 MG tablet Take 1 tablet (2.5 mg total) by mouth Schmitt. 90 tablet 3  . budesonide-formoterol (SYMBICORT) 160-4.5 MCG/ACT inhaler Inhale 2 puffs into the lungs 2 (two) times Schmitt.    . cyclobenzaprine (FLEXERIL) 10 MG tablet Take 10 mg by mouth  every 8 (eight) hours.    . folic acid (FOLVITE) 1 MG tablet Take 1 mg by mouth Schmitt.    . hydrochlorothiazide (HYDRODIURIL) 25 MG tablet Take 1 tablet by mouth once Schmitt 90 tablet 2  . latanoprost (XALATAN) 0.005 % ophthalmic solution INSTILL 1 DROP INTO BOTH EYES EVERY DAY AT BEDTIME    . lidocaine (LIDODERM) 5 % Place 1 patch onto the skin Schmitt. Remove & Discard patch within 12 hours or as directed by MD 14 patch 0  . lisinopril (ZESTRIL) 40 MG tablet Take 1 tablet by mouth once Schmitt 90 tablet 2  . loperamide (IMODIUM) 2 MG capsule Take 2 mg by mouth as needed for diarrhea or loose stools.    Marland Kitchen loratadine (CLARITIN) 10 MG tablet Take 10 mg by mouth Schmitt.    Marland Kitchen LORazepam (ATIVAN) 1 MG tablet Take 1 hour prior to MRI and may repeat if needed. 3 tablet 1  . methotrexate (RHEUMATREX) 2.5 MG tablet Take 10 mg by mouth 2 (two) times a week. On Friday and Saturday  2  . Multiple Vitamin (MULTIVITAMIN WITH MINERALS) TABS tablet Take 1 tablet by mouth Schmitt.    . nitroGLYCERIN (NITROSTAT) 0.4 MG SL tablet Place 1 tablet (0.4 mg total) under the tongue every 5 (five) minutes as needed for chest pain. 25 tablet 3  . omeprazole (PRILOSEC) 20 MG capsule Take 1 capsule (20 mg total) by mouth Schmitt. 30 capsule 0  . triamcinolone cream (KENALOG) 0.1 % APPLY TO AFFECTED AREAS TWO TIMES Schmitt     No current facility-administered medications for this visit.     OBJECTIVE: Morbidly obese African-American woman in no acute distress  Vitals:   07/30/19 1007  BP: (!) 101/47  Pulse: 87  Resp: 18  Temp: 98.9 F (37.2 C)  SpO2: 98%   Wt Readings from Last 3 Encounters:  07/30/19 277 lb 14.4 oz (126.1 kg)  04/16/19 288 lb (130.6 kg)  03/31/19 286 lb 12.8 oz (130.1 kg)   Body mass index is 43.53 kg/m.    ECOG FS:2 - Symptomatic, <50% confined to bed  Sclerae unicteric, EOMs intact Wearing a mask No cervical or supraclavicular adenopathy Lungs no rales or rhonchi Heart regular rate and  rhythm Abd soft, nontender, positive bowel sounds MSK no focal spinal tenderness, no upper extremity lymphedema Neuro: nonfocal, well oriented, appropriate affect Breasts: The right breast is unremarkable.  The left breast is status post remote mastectomy with saline reconstruction.  I  do not palpate any abnormalities and there is no evidence of residual or recurrent disease.  Both axillae are benign.   LAB RESULTS:  CMP     Component Value Date/Time   NA 144 03/31/2019 0849   K 3.5 03/31/2019 0849   CL 105 03/31/2019 0849   CO2 27 03/31/2019 0849   GLUCOSE 105 (H) 03/31/2019 0849   BUN 14 03/31/2019 0849   CREATININE 0.81 03/31/2019 0849   CREATININE 0.71 02/21/2016 1119   CALCIUM 9.7 03/31/2019 0849   PROT 7.6 03/31/2019 0849   ALBUMIN 3.8 03/31/2019 0849   AST 30 03/31/2019 0849   ALT 24 03/31/2019 0849   ALKPHOS 73 03/31/2019 0849   BILITOT 0.4 03/31/2019 0849   GFRNONAA >60 03/31/2019 0849   GFRNONAA >89 07/04/2014 1042   GFRAA >60 03/31/2019 0849   GFRAA >89 07/04/2014 1042    No results found for: TOTALPROTELP, ALBUMINELP, A1GS, A2GS, BETS, BETA2SER, GAMS, MSPIKE, SPEI  No results found for: KPAFRELGTCHN, LAMBDASER, KAPLAMBRATIO  Lab Results  Component Value Date   WBC 3.6 (L) 03/31/2019   NEUTROABS 1.9 03/31/2019   HGB 12.6 03/31/2019   HCT 39.3 03/31/2019   MCV 94.2 03/31/2019   PLT 270 03/31/2019    No results found for: LABCA2  No components found for: IEPPIR518  No results for input(s): INR in the last 168 hours.  No results found for: LABCA2  No results found for: ACZ660  No results found for: YTK160  No results found for: FUX323  Lab Results  Component Value Date   CA2729 31.1 03/31/2019    No components found for: HGQUANT  No results found for: CEA1 / No results found for: CEA1   No results found for: AFPTUMOR  No results found for: CHROMOGRNA  No results found for: PSA1  No visits with results within 3 Day(s) from this visit.   Latest known visit with results is:  Lab on 05/21/2019  Component Date Value Ref Range Status  . SARS-CoV-2, NAA 05/21/2019 Not Detected  Not Detected Final   Comment: This nucleic acid amplification test was developed and its performance characteristics determined by [employer]. Nucleic acid amplification tests include PCR and TMA. This test has not been FDA cleared or approved. This test has been authorized by FDA under an Emergency Use Authorization (EUA). This test is only authorized for the duration of time the declaration that circumstances exist justifying the authorization of the emergency use of in vitro diagnostic tests for detection of SARS-CoV-2 virus and/or diagnosis of COVID-19 infection under section 564(b)(1) of the Act, 21 U.S.C. 557DUK-0(U) (1), unless the authorization is terminated or revoked sooner. When diagnostic testing is negative, the possibility of a false negative result should be considered in the context of a patient's recent exposures and the presence of clinical signs and symptoms consistent with COVID-19. An individual without symptoms of COVID-19 and who is not shedding SARS-CoV-2 virus would                           expect to have a negative (not detected) result in this assay.     (this displays the last labs from the last 3 days)  No results found for: TOTALPROTELP, ALBUMINELP, A1GS, A2GS, BETS, BETA2SER, GAMS, MSPIKE, SPEI (this displays SPEP labs)  No results found for: KPAFRELGTCHN, LAMBDASER, KAPLAMBRATIO (kappa/lambda light chains)  No results found for: HGBA, HGBA2QUANT, HGBFQUANT, HGBSQUAN (Hemoglobinopathy evaluation)   No results found for: LDH  No results found for: IRON, TIBC, IRONPCTSAT (Iron and TIBC)  No results found for: FERRITIN  Urinalysis    Component Value Date/Time   LABSPEC >=1.030 09/18/2017 1123   PHURINE 6.5 09/18/2017 1123   GLUCOSEU NEGATIVE 09/18/2017 [place] 09/18/2017 [place] 09/18/2017 1123   BILIRUBINUR negative 04/09/2016 1803   BILIRUBINUR Negative 08/20/2014 1444   KETONESUR NEGATIVE 09/18/2017 1123   PROTEINUR NEGATIVE 09/18/2017 1123   UROBILINOGEN 0.2 09/18/2017 1123   NITRITE NEGATIVE 09/18/2017 1123   LEUKOCYTESUR TRACE (A) 09/18/2017 1123    STUDIES:  No results found.   ELIGIBLE FOR AVAILABLE RESEARCH PROTOCOL: No   ASSESSMENT: 59 y.o. [place], [place] woman with a long history of dermatomyositis, as well as breast cancer, as follows  (1) status post left mastectomy and axillary lymph node dissection 1999 for a T2 N1b, stage IIIB invasive breast cancer, triple negative  (a) status post cyclophosphamide and doxorubicin x4  (b) did not receive adjuvant radiation secondary to history of myositis  (2) status post hysterectomy and bilateral salpingo-oophorectomy 2004 for what seems to have been a stage I endometrial carcinoma  (3) Genetic testing reported out on 02/20/2019 through the [facility] Multi- cancer panel found no pathogenic mutations. The Multi-Cancer Panel offered by [facility] includes sequencing and/or deletion duplication testing of the following 85 genes: AIP, ALK, APC, ATM, AXIN2,BAP1,  BARD1, BLM, BMPR1A, BRCA1, BRCA2, BRIP1, CASR, CDC73, CDH1, CDK4, CDKN1B, CDKN1C, CDKN2A (p14ARF), CDKN2A (p16INK4a), CEBPA, CHEK2, CTNNA1, DICER1, DIS3L2, EGFR (c.2369C>T, p.Thr790Met variant only), EPCAM (Deletion/duplication testing only), FH, FLCN, GATA2, GPC3, GREM1 (Promoter region deletion/duplication testing only), HOXB13 (c.251G>A, p.Gly84Glu), HRAS, KIT, MAX, MEN1, MET, MITF (c.952G>A, p.Glu318Lys variant only), MLH1, MSH2, MSH3, MSH6, MUTYH, NBN, NF1, NF2, NTHL1, PALB2, PDGFRA, PHOX2B, PMS2, POLD1, POLE, POT1, PRKAR1A, PTCH1, PTEN, RAD50, RAD51C, RAD51D, RB1, RECQL4, RET, RNF43, RUNX1, SDHAF2, SDHA (sequence changes only), SDHB, SDHC, SDHD, SMAD4, SMARCA4, SMARCB1, SMARCE1, STK11, SUFU, TERC, TERT, TMEM127, TP53, TSC1, TSC2, VHL, WRN  and WT1.   PLAN: Lam is more than 20 years out from her definitive surgery for breast cancer, with no evidence of disease recurrence.  This is very favorable.  As far as the breast cancer is concerned she will have mammography in September and see me shortly thereafter.  If all continues well at that time we can consider her "graduating" to the survivorship program here.  Am delighted she is getting the help she needs from the [facility] system.  She knows to call for any other issue that may develop before the next visit  Total encounter time 20 minutes.Sarajane Jews C. Maddix Heinz, MD 07/30/19 11:33 AM Medical Oncology and Hematology [facility] [place], [place] [address] Tel. [phone]    Fax. [phone]    I, Wilburn Mylar, am acting as scribe for Dr. Virgie Dad. Marchel Foote.  I, Lurline Del MD, have reviewed the above documentation for accuracy and completeness, and I agree with the above.    *Total Encounter Time as defined by the Centers for Medicare and Medicaid Services includes, in addition to the face-to-face time of a patient visit (documented in the note above) non-face-to-face time: obtaining and reviewing outside history, ordering and reviewing medications, tests or procedures, care coordination (communications with other health care professionals or caregivers) and documentation in the medical record.

## 2019-07-30 ENCOUNTER — Telehealth: Payer: Self-pay | Admitting: Oncology

## 2019-07-30 ENCOUNTER — Other Ambulatory Visit: Payer: Self-pay

## 2019-07-30 ENCOUNTER — Inpatient Hospital Stay: Payer: Medicaid Other | Attending: Oncology | Admitting: Oncology

## 2019-07-30 DIAGNOSIS — Z9012 Acquired absence of left breast and nipple: Secondary | ICD-10-CM | POA: Diagnosis not present

## 2019-07-30 DIAGNOSIS — C50812 Malignant neoplasm of overlapping sites of left female breast: Secondary | ICD-10-CM

## 2019-07-30 DIAGNOSIS — Z9221 Personal history of antineoplastic chemotherapy: Secondary | ICD-10-CM | POA: Diagnosis not present

## 2019-07-30 DIAGNOSIS — Z171 Estrogen receptor negative status [ER-]: Secondary | ICD-10-CM

## 2019-07-30 DIAGNOSIS — Z853 Personal history of malignant neoplasm of breast: Secondary | ICD-10-CM | POA: Diagnosis present

## 2019-07-30 NOTE — Telephone Encounter (Signed)
Scheduled appts per 3/25 los. Pt confirmed new appt date and time.

## 2019-08-30 ENCOUNTER — Encounter: Payer: Self-pay | Admitting: Oncology

## 2019-08-31 ENCOUNTER — Encounter: Payer: Self-pay | Admitting: *Deleted

## 2019-12-28 ENCOUNTER — Encounter (HOSPITAL_COMMUNITY): Payer: Self-pay | Admitting: Emergency Medicine

## 2019-12-28 ENCOUNTER — Emergency Department (HOSPITAL_COMMUNITY)
Admission: EM | Admit: 2019-12-28 | Discharge: 2019-12-29 | Disposition: A | Discharge status: Home / Self Care | Payer: Medicaid Other | Attending: Emergency Medicine | Admitting: Emergency Medicine

## 2019-12-28 ENCOUNTER — Other Ambulatory Visit: Payer: Self-pay

## 2019-12-28 DIAGNOSIS — M79605 Pain in left leg: Secondary | ICD-10-CM | POA: Diagnosis present

## 2019-12-28 DIAGNOSIS — M48061 Spinal stenosis, lumbar region without neurogenic claudication: Secondary | ICD-10-CM | POA: Diagnosis not present

## 2019-12-28 DIAGNOSIS — Z5321 Procedure and treatment not carried out due to patient leaving prior to being seen by health care provider: Secondary | ICD-10-CM | POA: Diagnosis not present

## 2019-12-28 NOTE — ED Triage Notes (Signed)
Pt presents to ED POV. Pt c/o L leg pain. Pt reports that she woke up and could not put any weight on L leg or foot. Pt reports that she has hx of lumbar spinal stenosis and arthritis

## 2019-12-29 NOTE — ED Notes (Signed)
Pt called x3 for vitals, no answer 

## 2020-01-31 NOTE — Progress Notes (Signed)
[place]  Telephone:([phone]) [phone] Fax:(336) [phone]    ID: Gail Schmitt DOB: [birth year]  MR#: [id number]  PIR#:[id number]  Patient Care Team: [name], MD as PCP - General (Family Medicine) Croitoru, Dani Gobble, MD as PCP - Cardiology (Cardiology) Donzetta Starch, MD as PCP - Infectious Diseases (Physical Medicine and Rehabilitation) Magrinat, Virgie Dad, MD as Consulting Physician (Oncology) Ala Dach, MD as Referring Physician (Hematology and Oncology) Jamesetta So, MD as Referring Physician (Specialist) Pyrtle, Lajuan Lines, MD as Consulting Physician (Gastroenterology) OTHER MD:   CHIEF COMPLAINT: 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  She continues under observation.  Since her last visit, she has not undergone any additional studies. She is currently behind on her right mammography  She is being followed closely at [facility] for her degenerative disc disease and dermatomyositis   REVIEW OF SYSTEMS: Gail Schmitt tells me that she hurts all the time. She takes muscle relaxants and Aleve primarily for this. She is immunocompromise secondary to her methotrexate and has received her vaccines for COVID-19. She fell and injured her left foot. Right now the most she can do is "a little bit. She just got Medicare though and she is planning to do Silver sneakers. A detailed review of systems was otherwise stable    HISTORY OF CURRENT ILLNESS: From the original intake note:  Gail Schmitt has a history of left breast cancer diagnosed in 1999. Per [name] notes, her cancer was T2N1bM0, ERPR negative, HER2 negative, grade 3. She was treated under [name] in [facility] with a mastectomy and axillary dissection and 4 cycles of Adriamycin and Cytoxan. She did not receive radiation therapy because of concurrent  myositis..   More recently she reported local pain along the lateral aspect of the right breast. She underwent a digital diagnostic right mammogram with tomography and an ultrasound of the right breast on 01/14/2019 showing: Breast Density Category B. There are no suspicious masses, areas of architectural distortion, areas of significant asymmetry or suspicious calcifications. No mammographic change. There is no mammographic abnormality in the region of the patient's focal breast pain. On physical exam, patient is tender to palpation along the far lateral aspect of the right breast. There is no palpable mass. Targeted ultrasound is performed, showing normal tissue throughout the lateral aspect of the right breast in the area of the patient's focal pain. No mass or suspicious lesion.  The patient's subsequent history is as detailed below.   PAST MEDICAL HISTORY: Past Medical History:  Diagnosis Date   Arthritis    "left hip; hands" (07/28/2014)   Breast cancer, left breast ([place]) 1999   a. s/p left mastectomy   Dermatomyositis ([place])    a. 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[place])    Family history of adverse reaction to anesthesia    "all my daughters get real sick and throw up alot"   Family history of brain cancer    Family history of breast cancer    Family history of stomach cancer    Fatty liver    Headache    "maybe monthly" (07/28/2014)   Heart murmur    Hypertension    Mitral valve prolapse    Normal coronary arteries    last cath 2016   Obesity  Pneumonia    "haven't had it in the last 4 yrs; before that I had it q yr for 4-5 years" (07/28/2014)   Pulmonary embolism ([place]) ~ 2003   RBBB    with symptomatic bradycardia   Tubular adenoma of colon    Unstable angina ([place])    a. Cath ~ 10 + yrs ago, [facility], [place] - "normal but pressures were high."   Uterine cancer ([place])  2007    PAST SURGICAL HISTORY: Past Surgical History:  Procedure Laterality Date   BREAST BIOPSY Left 1999   CARDIAC CATHETERIZATION  07/13/2011   CHOLECYSTECTOMY N/A 07/31/2014   Procedure: LAPAROSCOPIC CHOLECYSTECTOMY WITH INTRAOPERATIVE CHOLANGIOGRAM;  Surgeon: Erroll Luna, MD;  Location: [facility];  Service: General;  Laterality: N/A;   INGUINAL HERNIA REPAIR Left [place] WITH CORONARY ANGIOGRAM N/A 07/13/2011   Procedure: LEFT HEART CATHETERIZATION WITH CORONARY ANGIOGRAM;  Surgeon: Josue Hector, MD;  Location: [facility] CATH LAB;  Service: Cardiovascular;  Laterality: N/A;   LEFT HEART CATHETERIZATION WITH CORONARY ANGIOGRAM N/A 07/30/2014   Procedure: LEFT HEART CATHETERIZATION WITH CORONARY ANGIOGRAM;  Surgeon: [name], MD;  Location: [facility] CATH LAB;  Service: Cardiovascular;  Laterality: N/A;   MASTECTOMY Left 1999   RECONSTRUCTION BREAST IMMEDIATE / DELAYED W/ TISSUE EXPANDER Left 2005   TOTAL ABDOMINAL HYSTERECTOMY  2007   TUBAL LIGATION  1990    FAMILY HISTORY: Family History  Problem Relation Age of Onset   Diabetes type II Mother    Colon polyps Mother    Other Mother        cholangiocarcinoma   Diabetes Sister    Stroke Brother    Brain cancer Paternal Aunt    Brain cancer Paternal Grandmother    Breast cancer Other    Colon cancer Neg Hx    Joann's father is living as of September 2020, in his mid 63s. Patients' mother died from an "unusual stomach cancer" at the age of 7.. The patient has 1 brothers and 2 sisters.  One maternal great aunts had breast cancer.  Patient denies anyone in her family having ovarian, prostate, or pancreatic cancer.    GYNECOLOGIC HISTORY:  No LMP recorded. Patient has had a hysterectomy. 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  Her husband [name] is disabled secondary to a stroke.  It is just the 2 of them at home.  The patient's oldest daughter Varney Daily lives in [place] and has her own business.  Daughter Ellin Saba lives in [place] and works for a bank.  Dr. Parke Poisson lives in [place] and is disabled secondary to asked Buerger's.  The patient has 4 grandchildren, no great-grandchildren.   ADVANCED DIRECTIVES: The patient tells me her daughters [name] and Leisure centre manager and are together her healthcare power of attorney   HEALTH MAINTENANCE: Social History   Tobacco Use   Smoking status: Never Smoker   Smokeless tobacco: Never Used  Vaping Use   Vaping Use: Never used  Substance Use Topics   Alcohol use: Not Currently    Alcohol/week: 1.0 standard drink    Types: 1 Standard drinks or equivalent per week    Comment: 07/28/2014 "might have  1 drink a couple times/month"   Drug use: No    Colonoscopy: November 2019/ Pyrtle  PAP: STATUS post hysterectomy  Bone density: no   Allergies  Allergen Reactions   Meloxicam Other (See Comments)    Made her hurt more   Percocet [Oxycodone-Acetaminophen] Itching and Rash    Current Outpatient Medications  Medication Sig Dispense Refill   albuterol (VENTOLIN HFA) 108 (90 Base) MCG/ACT inhaler Inhale 1-2 puffs into the lungs every 6 (six) hours as needed for wheezing or shortness of breath. 8 g 0   amLODipine (NORVASC) 2.5 MG tablet Take 1 tablet (2.5 mg total) by mouth daily. 90 tablet 3   budesonide-formoterol (SYMBICORT) 160-4.5 MCG/ACT inhaler Inhale 2 puffs into the lungs 2 (two) times daily.     cyclobenzaprine (FLEXERIL) 10 MG tablet Take 10 mg by mouth every 8 (eight) hours.     folic acid (FOLVITE) 1 MG tablet Take 1 mg by mouth daily.     hydrochlorothiazide (HYDRODIURIL) 25 MG  tablet Take 1 tablet by mouth once daily 90 tablet 2   latanoprost (XALATAN) 0.005 % ophthalmic solution INSTILL 1 DROP INTO BOTH EYES EVERY DAY AT BEDTIME     lidocaine (LIDODERM) 5 % Place 1 patch onto the skin daily. Remove & Discard patch within 12 hours or as directed by MD 14 patch 0   lisinopril (ZESTRIL) 40 MG tablet Take 1 tablet by mouth once daily 90 tablet 2   loperamide (IMODIUM) 2 MG capsule Take 2 mg by mouth as needed for diarrhea or loose stools.     loratadine (CLARITIN) 10 MG tablet Take 10 mg by mouth daily.     LORazepam (ATIVAN) 1 MG tablet Take 1 hour prior to MRI and may repeat if needed. 3 tablet 1   methotrexate (RHEUMATREX) 2.5 MG tablet Take 10 mg by mouth 2 (two) times a week. On Friday and Saturday  2   Multiple Vitamin (MULTIVITAMIN WITH MINERALS) TABS tablet Take 1 tablet by mouth daily.     nitroGLYCERIN (NITROSTAT) 0.4 MG SL tablet Place 1 tablet (0.4 mg total) under the tongue every 5 (five) minutes as needed for chest pain. 25 tablet 3   omeprazole (PRILOSEC) 20 MG capsule Take 1 capsule (20 mg total) by mouth daily. 30 capsule 0   triamcinolone cream (KENALOG) 0.1 % APPLY TO AFFECTED AREAS TWO TIMES DAILY     No current facility-administered medications for this visit.     OBJECTIVE: African-American woman who appears stated age  30:   02/01/20 1130  BP: 133/80  Pulse: 78  Resp: 18  Temp: (!) 96.9 F (36.1 C)  SpO2: 100%   Wt Readings from Last 3 Encounters:  02/01/20 284 lb 12.8 oz (129.2 kg)  07/30/19 277 lb 14.4 oz (126.1 kg)  04/16/19 288 lb (130.6 kg)   Body mass index is 44.61 kg/m.    ECOG FS:2 - Symptomatic, <50% confined to bed  Sclerae unicteric, EOMs intact Wearing a mask No cervical or supraclavicular adenopathy Lungs no rales or rhonchi Heart regular rate and rhythm Abd soft, obese, nontender, positive bowel sounds MSK no focal spinal tenderness, no upper extremity lymphedema Neuro: nonfocal, well oriented,  appropriate affect Breasts: The right breast is benign. The left breast is status post mastectomy with saline implant reconstruction. There is no evidence of local recurrence. Both axillae are benign.   LAB RESULTS:  CMP     Component Value Date/Time   NA 141 02/01/2020 1054   K  3.7 02/01/2020 1054   CL 102 02/01/2020 1054   CO2 33 (H) 02/01/2020 1054   GLUCOSE 89 02/01/2020 1054   BUN 13 02/01/2020 1054   CREATININE 0.84 02/01/2020 1054   CREATININE 0.71 02/21/2016 1119   CALCIUM 9.5 02/01/2020 1054   PROT 7.5 02/01/2020 1054   ALBUMIN 3.4 (L) 02/01/2020 1054   AST 17 02/01/2020 1054   ALT 12 02/01/2020 1054   ALKPHOS 83 02/01/2020 1054   BILITOT 0.5 02/01/2020 1054   GFRNONAA >60 02/01/2020 1054   GFRNONAA >89 07/04/2014 1042   GFRAA >60 02/01/2020 1054   GFRAA >89 07/04/2014 1042    No results found for: TOTALPROTELP, ALBUMINELP, A1GS, A2GS, BETS, BETA2SER, GAMS, MSPIKE, SPEI  No results found for: KPAFRELGTCHN, LAMBDASER, KAPLAMBRATIO  Lab Results  Component Value Date   WBC 3.4 (L) 02/01/2020   NEUTROABS 1.7 02/01/2020   HGB 12.3 02/01/2020   HCT 38.5 02/01/2020   MCV 93.0 02/01/2020   PLT 241 02/01/2020    No results found for: LABCA2  No components found for: TMLYYT035  No results for input(s): INR in the last 168 hours.  No results found for: LABCA2  No results found for: WSF681  No results found for: EXN170  No results found for: YFV494  Lab Results  Component Value Date   CA2729 31.1 03/31/2019    No components found for: HGQUANT  No results found for: CEA1 / No results found for: CEA1   No results found for: AFPTUMOR  No results found for: CHROMOGRNA  No results found for: HGBA, HGBA2QUANT, HGBFQUANT, HGBSQUAN (Hemoglobinopathy evaluation)   No results found for: LDH  No results found for: IRON, TIBC, IRONPCTSAT (Iron and TIBC)  No results found for: FERRITIN  Urinalysis    Component Value Date/Time   LABSPEC >=1.030  09/18/2017 1123   PHURINE 6.5 09/18/2017 1123   GLUCOSEU NEGATIVE 09/18/2017 1123   HGBUR NEGATIVE 09/18/2017 [place] 09/18/2017 1123   BILIRUBINUR negative 04/09/2016 1803   BILIRUBINUR Negative 08/20/2014 1444   KETONESUR NEGATIVE 09/18/2017 1123   PROTEINUR NEGATIVE 09/18/2017 1123   UROBILINOGEN 0.2 09/18/2017 1123   NITRITE NEGATIVE 09/18/2017 1123   LEUKOCYTESUR TRACE (A) 09/18/2017 1123    STUDIES:  No results found.   ELIGIBLE FOR AVAILABLE RESEARCH PROTOCOL: No   ASSESSMENT: 59 y.o. [place], [place] woman with a long history of dermatomyositis, as well as breast cancer, as follows  (1) status post left mastectomy and axillary lymph node dissection 1999 for a T2 N1b, stage IIIB invasive breast cancer, triple negative  (a) status post cyclophosphamide and doxorubicin x4  (b) did not receive adjuvant radiation secondary to history of myositis  (2) status post hysterectomy and bilateral salpingo-oophorectomy 2004 for what seems to have been a stage I endometrial carcinoma  (3) Genetic testing reported out on 02/20/2019 through the [facility] Multi- cancer panel found no pathogenic mutations. The Multi-Cancer Panel offered by [facility] includes sequencing and/or deletion duplication testing of the following 85 genes: AIP, ALK, APC, ATM, AXIN2,BAP1,  BARD1, BLM, BMPR1A, BRCA1, BRCA2, BRIP1, CASR, CDC73, CDH1, CDK4, CDKN1B, CDKN1C, CDKN2A (p14ARF), CDKN2A (p16INK4a), CEBPA, CHEK2, CTNNA1, DICER1, DIS3L2, EGFR (c.2369C>T, p.Thr790Met variant only), EPCAM (Deletion/duplication testing only), FH, FLCN, GATA2, GPC3, GREM1 (Promoter region deletion/duplication testing only), HOXB13 (c.251G>A, p.Gly84Glu), HRAS, KIT, MAX, MEN1, MET, MITF (c.952G>A, p.Glu318Lys variant only), MLH1, MSH2, MSH3, MSH6, MUTYH, NBN, NF1, NF2, NTHL1, PALB2, PDGFRA, PHOX2B, PMS2, POLD1, POLE, POT1, PRKAR1A, PTCH1, PTEN, RAD50, RAD51C, RAD51D, RB1, RECQL4, RET, RNF43,  RUNX1, SDHAF2, SDHA (sequence  changes only), SDHB, SDHC, SDHD, SMAD4, SMARCA4, SMARCB1, SMARCE1, STK11, SUFU, TERC, TERT, TMEM127, TP53, TSC1, TSC2, VHL, WRN and WT1.   PLAN: Gail Schmitt has a very remote history of breast cancer. She feels reassured by being seen here on a yearly basis and we are glad to oblige even though there is some duplication involved.  She is a bit behind on her mammography and I have entered the right order for that. I have encouraged her to pursue an active exercise program to the limit of her capacity. I think swimming would be terrific for her. She is going to consider it now that she has silver sneakers  Otherwise she will see me again late next year. She knows to call for any other issues that may develop before then.  Virgie Dad. Andren Bethea, MD 02/01/20 12:15 PM Medical Oncology and Hematology [facility] [place], [place] [address] Tel. [phone]    Fax. [phone]    I, Wilburn Mylar, am acting as scribe for Dr. Virgie Dad. Gail Schmitt.  I, Lurline Del MD, have reviewed the above documentation for accuracy and completeness, and I agree with the above.   *Total Encounter Time as defined by the Centers for Medicare and Medicaid Services includes, in addition to the face-to-face time of a patient visit (documented in the note above) non-face-to-face time: obtaining and reviewing outside history, ordering and reviewing medications, tests or procedures, care coordination (communications with other health care professionals or caregivers) and documentation in the medical record.

## 2020-02-01 ENCOUNTER — Inpatient Hospital Stay: Payer: Medicaid Other | Attending: Oncology | Admitting: Oncology

## 2020-02-01 ENCOUNTER — Other Ambulatory Visit: Payer: Self-pay

## 2020-02-01 ENCOUNTER — Inpatient Hospital Stay: Payer: Medicaid Other

## 2020-02-01 VITALS — BP 133/80 | HR 78 | Temp 96.9°F | Resp 18 | Ht 67.0 in | Wt 284.8 lb

## 2020-02-01 DIAGNOSIS — I10 Essential (primary) hypertension: Secondary | ICD-10-CM | POA: Insufficient documentation

## 2020-02-01 DIAGNOSIS — Z79899 Other long term (current) drug therapy: Secondary | ICD-10-CM | POA: Diagnosis not present

## 2020-02-01 DIAGNOSIS — Z86711 Personal history of pulmonary embolism: Secondary | ICD-10-CM

## 2020-02-01 DIAGNOSIS — Z853 Personal history of malignant neoplasm of breast: Secondary | ICD-10-CM | POA: Diagnosis present

## 2020-02-01 DIAGNOSIS — Z171 Estrogen receptor negative status [ER-]: Secondary | ICD-10-CM

## 2020-02-01 DIAGNOSIS — Z9012 Acquired absence of left breast and nipple: Secondary | ICD-10-CM | POA: Insufficient documentation

## 2020-02-01 DIAGNOSIS — C50812 Malignant neoplasm of overlapping sites of left female breast: Secondary | ICD-10-CM

## 2020-02-01 LAB — CBC WITH DIFFERENTIAL/PLATELET
Abs Immature Granulocytes: 0 10*3/uL (ref 0.00–0.07)
Basophils Absolute: 0.1 10*3/uL (ref 0.0–0.1)
Basophils Relative: 2 %
Eosinophils Absolute: 0.2 10*3/uL (ref 0.0–0.5)
Eosinophils Relative: 6 %
HCT: 38.5 % (ref 36.0–46.0)
Hemoglobin: 12.3 g/dL (ref 12.0–15.0)
Immature Granulocytes: 0 %
Lymphocytes Relative: 37 %
Lymphs Abs: 1.3 10*3/uL (ref 0.7–4.0)
MCH: 29.7 pg (ref 26.0–34.0)
MCHC: 31.9 g/dL (ref 30.0–36.0)
MCV: 93 fL (ref 80.0–100.0)
Monocytes Absolute: 0.3 10*3/uL (ref 0.1–1.0)
Monocytes Relative: 8 %
Neutro Abs: 1.7 10*3/uL (ref 1.7–7.7)
Neutrophils Relative %: 47 %
Platelets: 241 10*3/uL (ref 150–400)
RBC: 4.14 MIL/uL (ref 3.87–5.11)
RDW: 13.6 % (ref 11.5–15.5)
WBC: 3.4 10*3/uL — ABNORMAL LOW (ref 4.0–10.5)
nRBC: 0 % (ref 0.0–0.2)

## 2020-02-01 LAB — COMPREHENSIVE METABOLIC PANEL
ALT: 12 U/L (ref 0–44)
AST: 17 U/L (ref 15–41)
Albumin: 3.4 g/dL — ABNORMAL LOW (ref 3.5–5.0)
Alkaline Phosphatase: 83 U/L (ref 38–126)
Anion gap: 6 (ref 5–15)
BUN: 13 mg/dL (ref 6–20)
CO2: 33 mmol/L — ABNORMAL HIGH (ref 22–32)
Calcium: 9.5 mg/dL (ref 8.9–10.3)
Chloride: 102 mmol/L (ref 98–111)
Creatinine, Ser: 0.84 mg/dL (ref 0.44–1.00)
GFR calc Af Amer: >60 mL/min (ref >60)
GFR calc non Af Amer: >60 mL/min (ref >60)
Glucose, Bld: 89 mg/dL (ref 70–99)
Potassium: 3.7 mmol/L (ref 3.5–5.1)
Sodium: 141 mmol/L (ref 135–145)
Total Bilirubin: 0.5 mg/dL (ref 0.3–1.2)
Total Protein: 7.5 g/dL (ref 6.5–8.1)

## 2020-02-02 ENCOUNTER — Telehealth: Payer: Self-pay | Admitting: Oncology

## 2020-02-02 LAB — CANCER ANTIGEN 27.29: CA 27.29: 19.8 U/mL (ref 0.0–38.6)

## 2020-02-02 NOTE — Telephone Encounter (Signed)
Scheduled appts per 9/27 los. Left voicemail with appt date and time.

## 2020-02-09 ENCOUNTER — Encounter: Payer: Self-pay | Admitting: Oncology

## 2020-02-11 ENCOUNTER — Other Ambulatory Visit: Payer: Self-pay | Admitting: Oncology

## 2020-02-11 DIAGNOSIS — Z171 Estrogen receptor negative status [ER-]: Secondary | ICD-10-CM

## 2020-02-11 DIAGNOSIS — C50812 Malignant neoplasm of overlapping sites of left female breast: Secondary | ICD-10-CM

## 2020-03-01 ENCOUNTER — Other Ambulatory Visit: Payer: Self-pay | Admitting: Cardiovascular Disease

## 2020-03-09 ENCOUNTER — Other Ambulatory Visit: Payer: Self-pay

## 2020-03-09 ENCOUNTER — Ambulatory Visit
Admission: RE | Admit: 2020-03-09 | Discharge: 2020-03-09 | Disposition: A | Discharge status: Home / Self Care | Payer: Medicaid Other | Source: Ambulatory Visit | Attending: Oncology | Admitting: Oncology

## 2020-03-09 DIAGNOSIS — C50812 Malignant neoplasm of overlapping sites of left female breast: Secondary | ICD-10-CM

## 2020-03-09 DIAGNOSIS — Z171 Estrogen receptor negative status [ER-]: Secondary | ICD-10-CM

## 2020-03-09 IMAGING — CT CT ANGIOGRAPHY CHEST
1 of 7 series · 4 of 16 positions shown · IV contrast (omnipaque)
Comparison: Chest CT-12/20/2017

CLINICAL DATA: History of pulmonary embolism, breast and uterine
cancer, now with shortness of breath for the past 3 weeks. Evaluate
for acute or chronic pulmonary embolism.

EXAM:
CT ANGIOGRAPHY CHEST WITH CONTRAST
TECHNIQUE: Multidetector CT imaging of the chest was performed using the
standard protocol during bolus administration of intravenous
contrast. Multiplanar CT image reconstructions and MIPs were
obtained to evaluate the vascular anatomy.
CONTRAST:  75mL OMNIPAQUE IOHEXOL 350 MG/ML SOLN

[Series 8: pe thins · axial · 0.71mm/px · z∈[+1198,+1391]mm · 4 of 461 slices shown]
[im 93/461  lung]
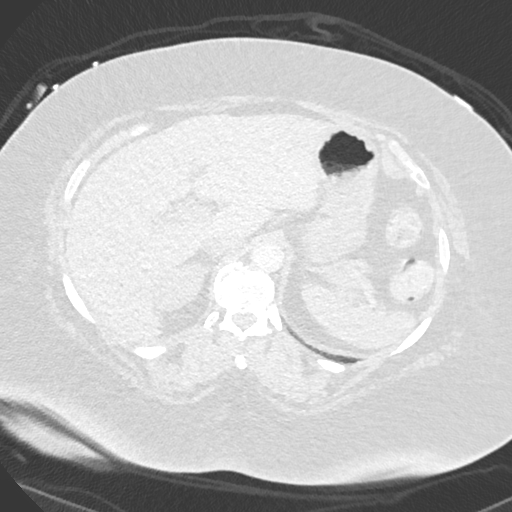
[im 185/461  soft-tissue]
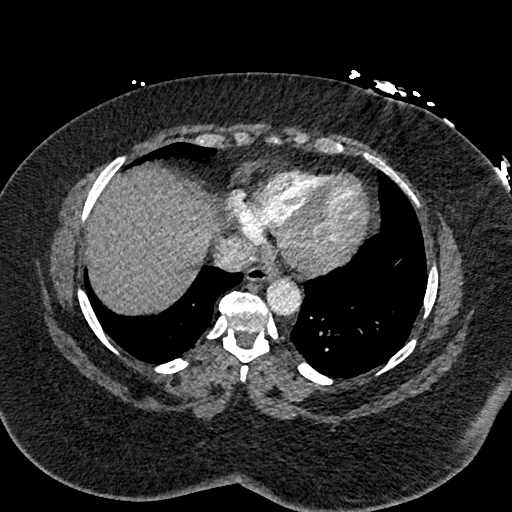
[im 277/461  lung]
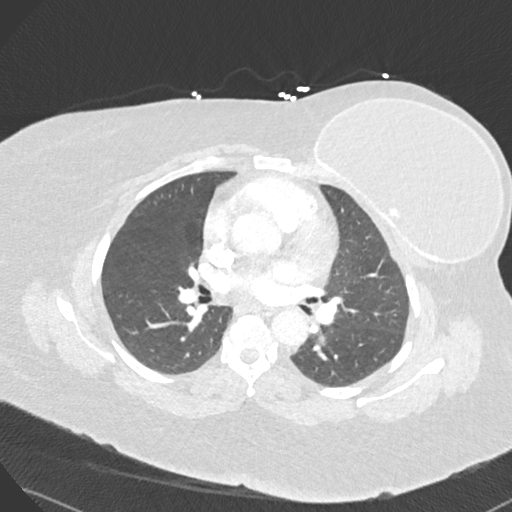
[im 369/461  soft-tissue]
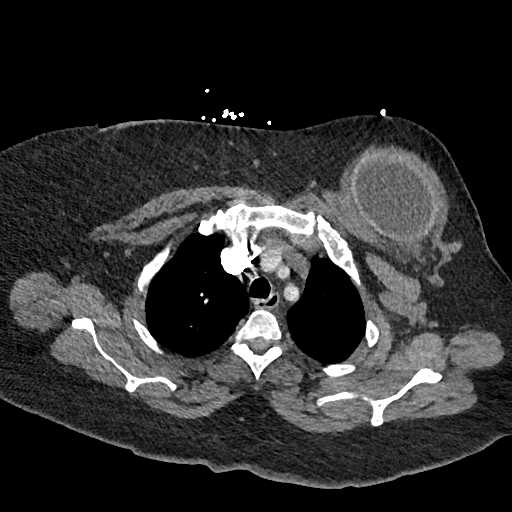

[4 of 16 positions shown; findings below may reference images not displayed]

FINDINGS: Vascular Findings:

There is adequate opacification of the pulmonary arterial system
with the main pulmonary artery measuring 288 Hounsfield units. There
are no discrete filling defects within the pulmonary arterial tree
to suggest pulmonary embolism. Normal caliber of the main pulmonary
artery.

Normal heart size. Small amount of pericardial fluid, presumably
physiologic.

No evidence of thoracic aortic dissection or periaortic stranding on
this nongated examination. Conventional configuration of the aortic
arch. The branch vessels of the aortic arch appear widely patent
throughout their imaged courses.

Review of the MIP images confirms the above findings.

----------------------------------------------------------------------------------

Nonvascular Findings:

Mediastinum/Lymph Nodes: No bulky mediastinal, hilar or axillary
lymphadenopathy.

Lungs/Pleura: No focal airspace opacities. No pleural effusion or
pneumothorax. The central pulmonary airways appear widely patent.

No discrete pulmonary nodules.

Upper abdomen: Limited early arterial phase evaluation of the upper
abdomen demonstrates sequela of prior cholecystectomy.

Musculoskeletal: No acute or aggressive osseous abnormalities. Post
left-sided breast augmentation. Normal appearance of the thyroid
gland.
IMPRESSION: No acute cardiopulmonary disease. Specifically, no evidence of acute
or chronic pulmonary embolism.

## 2020-04-14 ENCOUNTER — Encounter: Payer: Self-pay | Admitting: Cardiovascular Disease

## 2020-04-14 ENCOUNTER — Ambulatory Visit (INDEPENDENT_AMBULATORY_CARE_PROVIDER_SITE_OTHER): Payer: Medicaid Other | Admitting: Cardiovascular Disease

## 2020-04-14 ENCOUNTER — Other Ambulatory Visit: Payer: Self-pay

## 2020-04-14 VITALS — BP 122/76 | HR 82 | Ht 66.0 in | Wt 297.0 lb

## 2020-04-14 DIAGNOSIS — M3390 Dermatopolymyositis, unspecified, organ involvement unspecified: Secondary | ICD-10-CM | POA: Diagnosis not present

## 2020-04-14 DIAGNOSIS — I10 Essential (primary) hypertension: Secondary | ICD-10-CM | POA: Diagnosis not present

## 2020-04-14 NOTE — Patient Instructions (Signed)

## 2020-04-14 NOTE — Progress Notes (Signed)
Cardiology Office Note:    Date:  04/18/2020   ID:  Gail Schmitt, DOB 1960-09-30, MRN [id number]  PCP:  [name], MD  Cardiologist:  Sanda Klein, MD  Electrophysiologist:  None   Referring MD: [name], MD   No chief complaint on file. chest pain; echo  History of Present Illness:    Gail Schmitt is a 59 y.o. 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  She was hospitalized in August 2019 with chest pain.  Cardiac enzymes were normal.  Coronary CT angiography was negative for pulmonary embolism.  The coronary calcium score was 0.  Echocardiogram showed borderline LVEF of 50% with mild LVH, but without overt diastolic dysfunction.  Echo did not confirm the reported diagnosis of mitral valve prolapse.  The patient specifically denies any chest pain at rest exertion, dyspnea at rest or with exertion, orthopnea, paroxysmal nocturnal dyspnea, syncope, palpitations, focal neurological deficits, intermittent claudication, lower extremity edema, unexplained weight gain, cough, hemoptysis or wheezing.  She had problems with lumbar spine pain and left leg weakness but after receiving a shot and undergoing physical therapy she feels much better.  She's still using a walker or cane.  She was recently diagnosed with glaucoma.   Past Medical History:  Diagnosis Date  . Arthritis    "left hip; hands" (07/28/2014)  . Breast cancer, left breast ([place]) 1999   a. s/p left mastectomy  . Dermatomyositis ([place])    a. noted prior to diagnosis of Breast CA ("i'm allergic to cancer")  . 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 Family history of adverse reaction to anesthesia    "all my daughters get  real sick and throw up alot"  . Family history of brain cancer   . Family history of breast cancer   . Family history of stomach cancer   . Fatty liver   . Headache    "maybe monthly" (07/28/2014)  . Heart murmur   . Hypertension   . Mitral valve prolapse   . Normal coronary arteries    last cath 2016  . Obesity   . Pneumonia    "haven't had it in the last 4 yrs; before that I had it q yr for 4-5 years" (07/28/2014)  . Pulmonary embolism ([place]) ~ 2003  . RBBB    with symptomatic bradycardia  . Tubular adenoma of colon   . Unstable angina ([place])    a. Cath ~ 10 + yrs ago, [facility], [place] - "normal but pressures were high."  . Uterine cancer ([place]) 2007    Past Surgical History:  Procedure Laterality Date  . BREAST BIOPSY Left 1999  . CARDIAC CATHETERIZATION  07/13/2011  . CHOLECYSTECTOMY N/A 07/31/2014   Procedure: LAPAROSCOPIC CHOLECYSTECTOMY WITH INTRAOPERATIVE CHOLANGIOGRAM;  Surgeon: Erroll Luna, MD;  Location: [place];  Service: General;  Laterality: N/A;  . INGUINAL HERNIA REPAIR Left 1986  . LAPAROSCOPIC TOTAL HYSTERECTOMY    . LEFT HEART CATHETERIZATION WITH CORONARY ANGIOGRAM N/A 07/13/2011   Procedure: LEFT HEART CATHETERIZATION WITH CORONARY ANGIOGRAM;  Surgeon: Josue Hector, MD;  Location: [facility] C[facility] LAB;  Service: Cardiovascular;  Laterality: N/A;  . LEFT HEART CATHETERIZATION WITH CORONARY ANGIOGRAM N/A 07/30/2014   Procedure: LEFT HEART CATHETERIZATION WITH CORONARY ANGIOGRAM;  Surgeon: Charlann Lange  Irish Lack, MD;  [facility] C[facility];  Service: Cardiovascular;  Laterality: N/A;  . MASTECTOMY Left 1999  . RECONSTRUCTION BREAST IMMEDIATE / DELAYED W/ TISSUE EXPANDER Left 2005  . TOTAL ABDOMINAL HYSTERECTOMY  2007  . TUBAL LIGATION  1990    Current Medications: Current Meds  Medication Sig  . albuterol (VENTOLIN HFA) 108 (90 Base) MCG/ACT inhaler Inhale 1-2 puffs into the lungs every 6 (six) hours as needed for wheezing or shortness of breath.  Marland Kitchen amLODipine (NORVASC)  2.5 MG tablet Take 1 tablet (2.5 mg total) by mouth daily.  . budesonide-formoterol (SYMBICORT) 160-4.5 MCG/ACT inhaler Inhale 2 puffs into the lungs 2 (two) times daily.  . cyclobenzaprine (FLEXERIL) 10 MG tablet Take 10 mg by mouth every 8 (eight) hours.  . folic acid (FOLVITE) 1 MG tablet Take 1 mg by mouth daily.  . hydrochlorothiazide (HYDRODIURIL) 25 MG tablet Take 1 tablet by mouth once daily  . latanoprost (XALATAN) 0.005 % ophthalmic solution INSTILL 1 DROP INTO BOTH EYES EVERY DAY AT BEDTIME  . lidocaine (LIDODERM) 5 % Place 1 patch onto the skin daily. Remove & Discard patch within 12 hours or as directed by MD  . lisinopril (ZESTRIL) 40 MG tablet Take 1 tablet by mouth once daily  . loperamide (IMODIUM) 2 MG capsule Take 2 mg by mouth as needed for diarrhea or loose stools.  Marland Kitchen loratadine (CLARITIN) 10 MG tablet Take 10 mg by mouth daily.  Marland Kitchen LORazepam (ATIVAN) 1 MG tablet Take 1 hour prior to MRI and may repeat if needed.  . methotrexate (RHEUMATREX) 2.5 MG tablet Take 10 mg by mouth 2 (two) times a week. On Friday and Saturday  . Multiple Vitamin (MULTIVITAMIN WITH MINERALS) TABS tablet Take 1 tablet by mouth daily.  Marland Kitchen omeprazole (PRILOSEC) 20 MG capsule Take 1 capsule (20 mg total) by mouth daily.  . pregabalin (LYRICA) 50 MG capsule Take by mouth at bedtime.  . triamcinolone cream (KENALOG) 0.1 % APPLY TO AFFECTED AREAS TWO TIMES DAILY     Allergies:   Meloxicam and Percocet [oxycodone-acetaminophen]   Social History   Socioeconomic History  . Marital status: Divorced    Spouse name: Not on file  . Number of children: 3  . Years of education: Not on file  . Highest education level: Not on file  Occupational History  . 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 Smoking status: Never Smoker  . Smokeless tobacco: Never Used  Vaping Use  . Vaping Use: Never used  Substance and Sexual Activity  . Alcohol use: Not Currently    Alcohol/week: 1.0 standard drink     Types: 1 Standard drinks or equivalent per week    Comment: 07/28/2014 "might have 1 drink a couple times/month"  . Drug use: No  . Sexual activity: Not Currently  Other Topics Concern  . Not on file  Social History Narrative   Lives @ home in [place] with 1 of her 3 dtrs.  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  Doesn't follow any particular diet.   Social Determinants of Health   Financial Resource Strain: Not on file  Food Insecurity: Not on file  Transportation Needs: Not on file  Physical Activity: Not on file  Stress: Not on file  Social Connections: Not on file     Family History: The patient's family history includes Brain cancer in her paternal aunt and paternal grandmother; Breast cancer  in an other family member; Colon polyps in her mother; Diabetes in her sister; Diabetes type II in her mother; Other in her mother; Stroke in her brother. There is no history of Colon cancer.  ROS:   Please see the history of present illness.     All other systems reviewed and are negative.  EKGs/Labs/Other Studies Reviewed:    The following studies were reviewed today: Chest CT and echo from August 2019  EKG:  EKG is ordered today.  It shows normal sinus rhythm, normal tracing, QTC borderline at 460 ms.  Recent Labs: 02/01/2020: ALT 12; BUN 13; Creatinine, Ser 0.84; Hemoglobin 12.3; Platelets 241; Potassium 3.7; Sodium 141  Recent Lipid Panel    Component Value Date/Time   CHOL 163 12/20/2017 0550   TRIG 56 12/20/2017 0550   HDL 47 12/20/2017 0550   CHOLHDL 3.5 12/20/2017 0550   VLDL 11 12/20/2017 0550   LDLCALC 105 (H) 12/20/2017 0550    Physical Exam:    VS:  BP 122/76   Pulse 82   Ht 5' 6"  (1.676 m)   Wt 297 lb (134.7 kg)   SpO2 98%   BMI 47.94 kg/m     Wt Readings from Last 3 Encounters:  04/14/20 297 lb (134.7 kg)  02/01/20 284 lb 12.8 oz (129.2 kg)  07/30/19 277 lb 14.4 oz (126.1 kg)      General: Alert,  oriented x3, no distress, morbidly obese Head: no evidence of trauma, PERRL, EOMI, no exophtalmos or lid lag, no myxedema, no xanthelasma; normal ears, nose and oropharynx Neck: normal jugular venous pulsations and no hepatojugular reflux; brisk carotid pulses without delay and no carotid bruits Chest: clear to auscultation, no signs of consolidation by percussion or palpation, normal fremitus, symmetrical and full respiratory excursions Cardiovascular: normal position and quality of the apical impulse, regular rhythm, normal first and second heart sounds, no murmurs, rubs or gallops Abdomen: no tenderness or distention, no masses by palpation, no abnormal pulsatility or arterial bruits, normal bowel sounds, no hepatosplenomegaly Extremities: no clubbing, cyanosis; trivial ankle edema; 2+ radial, ulnar and brachial pulses bilaterally; 2+ right femoral, posterior tibial and dorsalis pedis pulses; 2+ left femoral, posterior tibial and dorsalis pedis pulses; no subclavian or femoral bruits Neurological: grossly nonfocal Psych: Normal mood and affect    ASSESSMENT:    1. Essential hypertension   2. Dermatomyositis ([place])    PLAN:    In order of problems listed above:  1. Chest pain: Currently asymptomatic.  Previously resolved with treatment for rheumatological disorder.  She has pleuritic symptoms (not particularly severe), mild evidence of inflammation based on CRP and ESR.  Extensive previous coronary work-up was negative.  2. HTN: Well-controlled.  No longer has complaints of orthostatic dizziness. 3. Leg pain: ABIs are normal.  Improved after injection physical therapy. 4. Dermatomyositis: Rhematologist is Dr. Dennison Mascot at [facility] 5. Morbid obesity: Weight loss would be highly beneficial.  Medication Adjustments/Labs and Tests Ordered: Current medicines are reviewed at length with the patient today.  Concerns regarding medicines are outlined above.  Orders Placed This Encounter   Procedures  . EKG 12-Lead   No orders of the defined types were placed in this encounter.   Patient Instructions  Medication Instructions:  No changes *If you need a refill on your cardiac medications before your next appointment, please call your pharmacy*   Lab Work: None ordered If you have labs (blood work) drawn today and your tests are completely normal, you will receive your results  only by: Marland Kitchen MyChart Message (if you have MyChart) OR . A paper copy in the mail If you have any lab test that is abnormal or we need to change your treatment, we will call you to review the results.   Testing/Procedures: None ordered   Follow-Up: At [facility], you and your health needs are our priority.  As part of our continuing mission to provide you with exceptional heart care, we have created designated Provider Care Teams.  These Care Teams include your primary Cardiologist (physician) and Advanced Practice Providers (APPs -  Physician Assistants and Nurse Practitioners) who all work together to provide you with the care you need, when you need it.  We recommend signing up for the patient portal called "MyChart".  Sign up information is provided on this After Visit Summary.  MyChart is used to connect with patients for Virtual Visits (Telemedicine).  Patients are able to view lab/test results, encounter notes, upcoming appointments, etc.  Non-urgent messages can be sent to your provider as well.   To learn more about what you can do with MyChart, go to [email or web address].    Your next appointment:   12 month(s)  The format for your next appointment:   In Person  Provider:   You may see Sanda Klein, MD or one of the following Advanced Practice Providers on your designated Care Team:    Almyra Deforest, PA-C  Fabian Sharp, [place] or   Roby Lofts, PA-C        Signed, Sanda Klein, MD  04/18/2020 11:01 AM    [place]

## 2020-05-10 ENCOUNTER — Other Ambulatory Visit: Payer: Self-pay | Admitting: Cardiovascular Disease

## 2020-06-11 ENCOUNTER — Other Ambulatory Visit: Payer: Self-pay | Admitting: Cardiovascular Disease

## 2020-06-29 ENCOUNTER — Other Ambulatory Visit: Payer: Self-pay | Admitting: Cardiovascular Disease

## 2020-08-11 ENCOUNTER — Other Ambulatory Visit: Payer: Self-pay | Admitting: Cardiovascular Disease

## 2021-01-20 ENCOUNTER — Ambulatory Visit
Admission: EM | Admit: 2021-01-20 | Discharge: 2021-01-20 | Disposition: A | Discharge status: Home / Self Care | Payer: Medicare Other | Attending: Emergency Medicine | Admitting: Emergency Medicine

## 2021-01-20 ENCOUNTER — Encounter: Payer: Self-pay | Admitting: Emergency Medicine

## 2021-01-20 DIAGNOSIS — J069 Acute upper respiratory infection, unspecified: Secondary | ICD-10-CM

## 2021-01-20 DIAGNOSIS — Z1152 Encounter for screening for COVID-19: Secondary | ICD-10-CM

## 2021-01-20 MED ORDER — BENZONATATE 100 MG PO CAPS: 100.0000 mg | ORAL_CAPSULE | Freq: Three times a day (TID) | ORAL | 0 refills | Status: DC

## 2021-01-20 NOTE — ED Triage Notes (Signed)
Pt presents today with c/o cough, runny nose x 2 days. Denies fever.

## 2021-01-20 NOTE — ED Provider Notes (Signed)
[place]   [id number] 01/20/21 Arrival Time: [id number]   CC: COVID symptoms  SUBJECTIVE: History from: patient.  Gail Schmitt is a 60 y.o. female who presents with cough, runny nose x 2 days.  Denies sick exposure to COVID, flu or strep.  Has tried OTC medications without relief.  Denies aggravating factors.  Reports previous symptoms in the past.   Denies fever, chills, SOB, wheezing, chest pain, nausea, changes in bowel or bladder habits.    ROS: As per HPI.  All other pertinent ROS negative.     Past Medical History:  Diagnosis Date   Arthritis    "left hip; hands" (07/28/2014)   Breast cancer, left breast ([place]) 1999   a. s/p left mastectomy   Dermatomyositis ([place])    a. 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[place])    Family history of adverse reaction to anesthesia    "all my daughters get real sick and throw up alot"   Family history of brain cancer    Family history of breast cancer    Family history of stomach cancer    Fatty liver    Headache    "maybe monthly" (07/28/2014)   Heart murmur    Hypertension    Mitral valve prolapse    Normal coronary arteries    last cath 2016   Obesity    Pneumonia    "haven't had it in the last 4 yrs; before that I had it q yr for 4-5 years" (07/28/2014)   Pulmonary embolism ([place]) ~ 2003   RBBB    with symptomatic bradycardia   Tubular adenoma of colon    Unstable angina ([place])    a. Cath ~ 10 + yrs ago, [facility], [place] - "normal but pressures were high."   Uterine cancer ([place]) 2007   Past Surgical History:  Procedure Laterality Date   BREAST BIOPSY Left 1999   CARDIAC CATHETERIZATION  07/13/2011   CHOLECYSTECTOMY N/A 07/31/2014   Procedure: LAPAROSCOPIC CHOLECYSTECTOMY WITH INTRAOPERATIVE CHOLANGIOGRAM;  Surgeon: Erroll Luna, MD;  Location: [place];  Service: General;  Laterality: N/A;   INGUINAL HERNIA REPAIR Left [place] WITH CORONARY ANGIOGRAM N/A 07/13/2011   Procedure: LEFT HEART CATHETERIZATION WITH CORONARY ANGIOGRAM;  Surgeon: Josue Hector, MD;  Location[facility] CATH LAB;  Service: Cardiovascular;  Laterality: N/A;   LEFT HEART CATHETERIZATION WITH CORONARY ANGIOGRAM N/A 07/30/2014   Procedure: LEFT HEART CATHETERIZATION WITH CORONARY ANGIOGRAM;  Surgeon: [name], MD;  Location: [facility] CATH LAB;  Service: Cardiovascular;  Laterality: N/A;   MASTECTOMY Left 1999   RECONSTRUCTION BREAST IMMEDIATE / DELAYED W/ TISSUE EXPANDER Left 2005   TOTAL ABDOMINAL HYSTERECTOMY  2007   TUBAL LIGATION  1990   Allergies  Allergen Reactions   Meloxicam Other (See Comments)    Made her hurt more   Percocet [Oxycodone-Acetaminophen] Itching and Rash   No current facility-administered medications on file prior to encounter.   Current Outpatient Medications on File Prior to Encounter  Medication Sig Dispense Refill   albuterol (VENTOLIN HFA) 108 (90 Base) MCG/ACT inhaler Inhale 1-2 puffs into the lungs every 6 (six) hours as needed for wheezing or shortness of breath. 8 g 0   amLODipine (NORVASC) 2.5 MG tablet Take 1 tablet by mouth once daily 90 tablet 3   budesonide-formoterol (SYMBICORT) 160-4.5 MCG/ACT inhaler Inhale 2 puffs  into the lungs 2 (two) times daily.     cyclobenzaprine (FLEXERIL) 10 MG tablet Take 10 mg by mouth every 8 (eight) hours.     folic acid (FOLVITE) 1 MG tablet Take 1 mg by mouth daily.     hydrochlorothiazide (HYDRODIURIL) 25 MG tablet Take 1 tablet by mouth once daily 90 tablet 2   latanoprost (XALATAN) 0.005 % ophthalmic solution INSTILL 1 DROP INTO BOTH EYES EVERY DAY AT BEDTIME     lidocaine (LIDODERM) 5 % Place 1 patch onto the skin daily. Remove & Discard patch within 12 hours or as directed by MD 14 patch 0   lisinopril (ZESTRIL) 40 MG tablet TAKE 1 TABLET BY MOUTH ONCE DAILY . APPOINTMENT REQUIRED FOR FUTURE REFILLS 90 tablet 3    loperamide (IMODIUM) 2 MG capsule Take 2 mg by mouth as needed for diarrhea or loose stools.     loratadine (CLARITIN) 10 MG tablet Take 10 mg by mouth daily.     LORazepam (ATIVAN) 1 MG tablet Take 1 hour prior to MRI and may repeat if needed. 3 tablet 1   methotrexate (RHEUMATREX) 2.5 MG tablet Take 10 mg by mouth 2 (two) times a week. On Friday and Saturday  2   Multiple Vitamin (MULTIVITAMIN WITH MINERALS) TABS tablet Take 1 tablet by mouth daily.     nitroGLYCERIN (NITROSTAT) 0.4 MG SL tablet Place 1 tablet (0.4 mg total) under the tongue every 5 (five) minutes as needed for chest pain. 25 tablet 3   omeprazole (PRILOSEC) 20 MG capsule Take 1 capsule (20 mg total) by mouth daily. 30 capsule 0   pregabalin (LYRICA) 50 MG capsule Take by mouth at bedtime.     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  Doesn't follow any particular diet.   Social Determinants of Health   Financial Resource Strain: Not on file  Food Insecurity: Not on file  Transportation Needs: Not on file  Physical Activity: Not on file  Stress: Not on file  Social  Connections: Not on file  Intimate Partner Violence: Not on file   Family History  Problem Relation Age of Onset   Diabetes type II Mother    Colon polyps Mother    Other Mother        cholangiocarcinoma   Diabetes Sister    Stroke Brother    Brain cancer Paternal Aunt    Brain cancer Paternal Grandmother    Breast cancer Other  Colon cancer Neg Hx     OBJECTIVE:  Vitals:   01/20/21 0826  BP: 127/81  Pulse: 89  Resp: 20  Temp: 98.4 F (36.9 C)  TempSrc: Oral  SpO2: 95%    General appearance: alert; appears fatigued, but nontoxic; speaking in full sentences and tolerating own secretions HEENT: NCAT; Ears: EACs clear, TMs pearly gray; Eyes: PERRL.  EOM grossly intact. Nose: nares patent without rhinorrhea, Throat: oropharynx clear, tonsils non erythematous or enlarged, uvula midline  Neck: supple without LAD Lungs: unlabored respirations, symmetrical air entry; cough: mild; no respiratory distress; CTAB Heart: regular rate and rhythm.   Skin: warm and dry Psychological: alert and cooperative; normal mood and affect  ASSESSMENT & PLAN:  1. Encounter for screening for COVID-19   2. Viral URI with cough     Meds ordered this encounter  Medications   benzonatate (TESSALON) 100 MG capsule    Sig: Take 1 capsule (100 mg total) by mouth every 8 (eight) hours.    Dispense:  21 capsule    Refill:  0    Order Specific Question:   Supervising Provider    Answer:   Raylene Everts [id number]   COVID testing ordered.  It will take between 5-7 days for test results.  Someone will contact you regarding abnormal results.    In the meantime: You should remain isolated in your home for 5 days from symptom onset AND greater than 72 hours after symptoms resolution (absence of fever without the use of fever-reducing medication and improvement in respiratory symptoms), whichever is longer Get plenty of rest and push fluids Tessalon Perles prescribed for cough Use OTC zyrtec for  nasal congestion, runny nose, and/or sore throat Use OTC flonase for nasal congestion and runny nose Use medications daily for symptom relief Use OTC medications like ibuprofen or tylenol as needed fever or pain Call or go to the ED if you have any new or worsening symptoms such as fever, worsening cough, shortness of breath, chest tightness, chest pain, turning blue, changes in mental status, etc...   Reviewed expectations re: course of current medical issues. Questions answered. Outlined signs and symptoms indicating need for more acute intervention. Patient verbalized understanding. After Visit Summary given.          Lestine Box, PA-C 01/20/21 [phone]

## 2021-01-20 NOTE — Discharge Instructions (Signed)

## 2021-01-21 LAB — COVID-19, FLU A+B NAA
Influenza A, NAA: NOT DETECTED
Influenza B, NAA: NOT DETECTED
SARS-CoV-2, NAA: NOT DETECTED

## 2021-03-05 NOTE — Progress Notes (Addendum)
Gail Schmitt  [phone] [phone]    ID: Gail Schmitt DOB: April 23, 1961  MR#: [id number]  MEQ#:[id number]  Patient Care Team: [name], MD as PCP - General (Family Medicine) Croitoru, Dani Gobble, MD as PCP - Cardiology (Cardiology) Donzetta Starch, MD as PCP - Infectious Diseases (Physical Medicine and Rehabilitation) Arin Peral, Virgie Dad, MD as Consulting Physician (Oncology) Ala Dach, MD as Referring Physician (Hematology and Oncology) Jamesetta So, MD as Referring Physician (Specialist) Pyrtle, Lajuan Lines, MD as Consulting Physician (Gastroenterology) OTHER MD:   CHIEF COMPLAINT: 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  She continues under observation.  Since her last visit, she underwent right diagnostic mammography with tomography and right breast ultrasonography at [facility] on 03/09/2020 showing: breast density category A; no evidence of malignancy.    REVIEW OF SYSTEMS: Gail Schmitt has developed several breast cysts.  She had 1 recently and she was using compresses and creams to try to get rid of it.  Interestingly she does not have these in the other breast or really anywhere else including the armpits.  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  She is very grateful that [facility] and she says she now feels like a woman again since she looks symmetrical.  A detailed review of systems was otherwise stable   COVID 19 VACCINATION STATUS: vaccinated x2    HISTORY OF CURRENT ILLNESS: From the original intake note:  Gail Schmitt has a history of left breast cancer diagnosed in 1999. Per [name] notes, her cancer was T2N1bM0, ERPR negative, HER2 negative, grade 3. She was treated under [name] in [facility] with a mastectomy and axillary dissection and 4 cycles of Adriamycin and Cytoxan. 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   More recently she reported local pain along the lateral aspect of the right breast. She underwent a digital diagnostic right mammogram with tomography and an ultrasound of the right breast on 01/14/2019 showing: Breast Density Category B. There are no suspicious masses, areas of architectural distortion, areas of significant asymmetry or suspicious calcifications. No mammographic change. There is no mammographic abnormality in the region of the patient's focal breast pain. On physical exam, patient is tender to palpation along the far lateral aspect of the right breast. There is no palpable mass. Targeted ultrasound is performed, showing normal tissue throughout the lateral aspect of the right breast in the area of the patient's focal pain. No mass or suspicious lesion.  The patient's subsequent history is as detailed below.   PAST MEDICAL HISTORY: Past Medical History:  Diagnosis Date   Arthritis    "left hip; hands" (07/28/2014)   Breast cancer, left breast ([place]) 1999   a. s/p left mastectomy   Dermatomyositis ([place])    a. 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[place])    Family history of adverse reaction to anesthesia    "all my daughters get real sick and throw up alot"   Family history of brain cancer    Family history of breast cancer    Family history of stomach cancer    Fatty liver    Headache    "  maybe monthly" (07/28/2014)   Heart murmur    Hypertension    Mitral valve prolapse    Normal coronary arteries    last cath 2016   Obesity    Pneumonia    "haven't had it in the last 4 yrs; before that I had it q yr for 4-5 years" (07/28/2014)   Pulmonary embolism ([place]) ~ 2003   RBBB    with symptomatic bradycardia    Tubular adenoma of colon    Unstable angina ([place])    a. Cath ~ 10 + yrs ago, [facility], [place] - "normal but pressures were high."   Uterine cancer ([place]) 2007    PAST SURGICAL HISTORY: Past Surgical History:  Procedure Laterality Date   BREAST BIOPSY Left 1999   CARDIAC CATHETERIZATION  07/13/2011   CHOLECYSTECTOMY N/A 07/31/2014   Procedure: LAPAROSCOPIC CHOLECYSTECTOMY WITH INTRAOPERATIVE CHOLANGIOGRAM;  Surgeon: Erroll Luna, MD;  Location: [facility];  Service: General;  Laterality: N/A;   INGUINAL HERNIA REPAIR Left [place] WITH CORONARY ANGIOGRAM N/A 07/13/2011   Procedure: LEFT HEART CATHETERIZATION WITH CORONARY ANGIOGRAM;  Surgeon: Josue Hector, MD;  Location: [facility] CATH LAB;  Service: Cardiovascular;  Laterality: N/A;   LEFT HEART CATHETERIZATION WITH CORONARY ANGIOGRAM N/A 07/30/2014   Procedure: LEFT HEART CATHETERIZATION WITH CORONARY ANGIOGRAM;  Surgeon: [name], MD;  Location: [facility] CATH LAB;  Service: Cardiovascular;  Laterality: N/A;   MASTECTOMY Left 1999   RECONSTRUCTION BREAST IMMEDIATE / DELAYED W/ TISSUE EXPANDER Left 2005   TOTAL ABDOMINAL HYSTERECTOMY  2007   TUBAL LIGATION  1990    FAMILY HISTORY: Family History  Problem Relation Age of Onset   Diabetes type II Mother    Colon polyps Mother    Other Mother        cholangiocarcinoma   Diabetes Sister    Stroke Brother    Brain cancer Paternal Aunt    Brain cancer Paternal Grandmother    Breast cancer Other    Colon cancer Neg Hx   Gail Schmitt's father is living as of September 2020, in his mid 76s. Patients' mother died from an "unusual stomach cancer" at the age of 43.. The patient has 1 brothers and 2 sisters.  One maternal great aunts had breast cancer.  Patient denies anyone in her family having ovarian, prostate, or pancreatic cancer.    GYNECOLOGIC HISTORY:  No LMP recorded. Patient has had a hysterectomy. 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  Her husband [name] is disabled secondary to a stroke.  It is just the 2 of them at home.  The patient's oldest daughter Varney Daily lives in [place] and has her own business.  Daughter Ellin Saba lives in [place] and works for a bank.  Dr. Parke Poisson lives in [place] and is disabled secondary to Asperger's.  The patient has 4 grandchildren  ADVANCED DIRECTIVES: The patient tells me her daughters [name] and Leisure centre manager and are together her healthcare power of attorney   HEALTH MAINTENANCE: Social History   Tobacco Use   Smoking status: Never   Smokeless tobacco: Never  Vaping Use   Vaping Use: Never used  Substance Use Topics   Alcohol  use: Not Currently    Alcohol/week: 1.0 standard drink    Types: 1 Standard drinks or equivalent per week    Comment: 07/28/2014 "might have 1 drink a couple times/month"   Drug use: No    Colonoscopy: November 2019/ Pyrtle  PAP: STATUS post hysterectomy  Bone density: no   Allergies  Allergen Reactions   Meloxicam Other (See Comments)    Made her hurt more   Percocet [Oxycodone-Acetaminophen] Itching and Rash    Current Outpatient Medications  Medication Sig Dispense Refill   albuterol (VENTOLIN HFA) 108 (90 Base) MCG/ACT inhaler Inhale 1-2 puffs into the lungs every 6 (six) hours as needed for wheezing or shortness of breath. 8 g 0   amLODipine (NORVASC) 2.5 MG tablet Take 1 tablet by mouth once daily 90 tablet 3   benzonatate (TESSALON) 100 MG capsule Take 1 capsule (100 mg total) by mouth every 8 (eight) hours. 21 capsule 0   budesonide-formoterol (SYMBICORT) 160-4.5 MCG/ACT inhaler Inhale 2 puffs into the lungs 2 (two) times daily.     cyclobenzaprine  (FLEXERIL) 10 MG tablet Take 10 mg by mouth every 8 (eight) hours.     folic acid (FOLVITE) 1 MG tablet Take 1 mg by mouth daily.     hydrochlorothiazide (HYDRODIURIL) 25 MG tablet Take 1 tablet by mouth once daily 90 tablet 2   latanoprost (XALATAN) 0.005 % ophthalmic solution INSTILL 1 DROP INTO BOTH EYES EVERY DAY AT BEDTIME     lidocaine (LIDODERM) 5 % Place 1 patch onto the skin daily. Remove & Discard patch within 12 hours or as directed by MD 14 patch 0   lisinopril (ZESTRIL) 40 MG tablet TAKE 1 TABLET BY MOUTH ONCE DAILY . APPOINTMENT REQUIRED FOR FUTURE REFILLS 90 tablet 3   loperamide (IMODIUM) 2 MG capsule Take 2 mg by mouth as needed for diarrhea or loose stools.     loratadine (CLARITIN) 10 MG tablet Take 10 mg by mouth daily.     LORazepam (ATIVAN) 1 MG tablet Take 1 hour prior to MRI and may repeat if needed. 3 tablet 1   methotrexate (RHEUMATREX) 2.5 MG tablet Take 10 mg by mouth 2 (two) times a week. On Friday and Saturday  2   Multiple Vitamin (MULTIVITAMIN WITH MINERALS) TABS tablet Take 1 tablet by mouth daily.     nitroGLYCERIN (NITROSTAT) 0.4 MG SL tablet Place 1 tablet (0.4 mg total) under the tongue every 5 (five) minutes as needed for chest pain. 25 tablet 3   omeprazole (PRILOSEC) 20 MG capsule Take 1 capsule (20 mg total) by mouth daily. 30 capsule 0   pregabalin (LYRICA) 50 MG capsule Take by mouth at bedtime.     triamcinolone cream (KENALOG) 0.1 % APPLY TO AFFECTED AREAS TWO TIMES DAILY     No current facility-administered medications for this visit.     OBJECTIVE: African-American woman who appears stated age  80:   03/06/21 1100  BP: 122/85  Pulse: 78  Resp: 18  Temp: (!) 97.3 F (36.3 C)  SpO2: 98%    Wt Readings from Last 3 Encounters:  03/06/21 290 lb 14.4 oz (132 kg)  04/14/20 297 lb (134.7 kg)  02/01/20 284 lb 12.8 oz (129.2 kg)   Body mass index is 46.95 kg/m.    ECOG FS:2 - Symptomatic, <50% confined to bed  Sclerae unicteric, EOMs  intact Wearing a mask No cervical or supraclavicular adenopathy Lungs no rales or rhonchi Heart regular rate and rhythm Abd soft, obese,  nontender, positive bowel sounds MSK no focal spinal tenderness, no upper extremity lymphedema Neuro: nonfocal, well oriented, appropriate affect Breasts: The right breast has what looks like a resolving cyst in the anterior superior section.  This is now not palpable.  It is otherwise benign.  On the left the patient is status post mastectomy.  There is saline implant in place.  There is no evidence of local recurrence.  Both axillae are benign.   LAB RESULTS:  CMP     Component Value Date/Time   NA 141 02/01/2020 1054   K 3.7 02/01/2020 1054   CL 102 02/01/2020 1054   CO2 33 (H) 02/01/2020 1054   GLUCOSE 89 02/01/2020 1054   BUN 13 02/01/2020 1054   CREATININE 0.84 02/01/2020 1054   CREATININE 0.71 02/21/2016 1119   CALCIUM 9.5 02/01/2020 1054   PROT 7.5 02/01/2020 1054   ALBUMIN 3.4 (L) 02/01/2020 1054   AST 17 02/01/2020 1054   ALT 12 02/01/2020 1054   ALKPHOS 83 02/01/2020 1054   BILITOT 0.5 02/01/2020 1054   GFRNONAA >60 02/01/2020 1054   GFRNONAA >89 07/04/2014 1042   GFRAA >60 02/01/2020 1054   GFRAA >89 07/04/2014 1042    No results found for: TOTALPROTELP, ALBUMINELP, A1GS, A2GS, BETS, BETA2SER, GAMS, MSPIKE, SPEI  No results found for: KPAFRELGTCHN, LAMBDASER, KAPLAMBRATIO  Lab Results  Component Value Date   WBC 6.2 03/06/2021   NEUTROABS 3.1 03/06/2021   HGB 12.7 03/06/2021   HCT 38.5 03/06/2021   MCV 91.7 03/06/2021   PLT 242 03/06/2021    No results found for: LABCA2  No components found for: GPQDIY641  No results for input(s): INR in the last 168 hours.  No results found for: LABCA2  No results found for: RAX094  No results found for: MHW808  No results found for: UPJ031  Lab Results  Component Value Date   CA2729 19.8 02/01/2020    No components found for: HGQUANT  No results found for: CEA1 /  No results found for: CEA1   No results found for: AFPTUMOR  No results found for: CHROMOGRNA  No results found for: HGBA, HGBA2QUANT, HGBFQUANT, HGBSQUAN (Hemoglobinopathy evaluation)   No results found for: LDH  No results found for: IRON, TIBC, IRONPCTSAT (Iron and TIBC)  No results found for: FERRITIN  Urinalysis    Component Value Date/Time   LABSPEC >=1.030 09/18/2017 1123   PHURINE 6.5 09/18/2017 1123   GLUCOSEU NEGATIVE 09/18/2017 1123   HGBUR NEGATIVE 09/18/2017 [place] 09/18/2017 1123   BILIRUBINUR negative 04/09/2016 1803   BILIRUBINUR Negative 08/20/2014 1444   KETONESUR NEGATIVE 09/18/2017 1123   PROTEINUR NEGATIVE 09/18/2017 1123   UROBILINOGEN 0.2 09/18/2017 1123   NITRITE NEGATIVE 09/18/2017 1123   LEUKOCYTESUR TRACE (A) 09/18/2017 1123    STUDIES:  No results found.   ELIGIBLE FOR AVAILABLE RESEARCH PROTOCOL: No   ASSESSMENT: 60 y.o. [place], [place] woman with a long history of dermatomyositis, as well as breast cancer, as follows  (1) status post left mastectomy and axillary lymph node dissection 1999 for a T2 N1b, stage IIIB invasive breast cancer, triple negative  (a) status post cyclophosphamide and doxorubicin x4  (b) did not receive adjuvant radiation secondary to history of myositis  (2) status post hysterectomy and bilateral salpingo-oophorectomy 2004 for what seems to have been a stage I endometrial carcinoma  (3) Genetic testing reported out on 02/20/2019 through the [facility] Multi- cancer panel found no pathogenic mutations. The Multi-Cancer Panel offered by  [facility] includes sequencing and/or deletion duplication testing of the following 85 genes: AIP, ALK, APC, ATM, AXIN2,BAP1,  BARD1, BLM, BMPR1A, BRCA1, BRCA2, BRIP1, CASR, CDC73, CDH1, CDK4, CDKN1B, CDKN1C, CDKN2A (p14ARF), CDKN2A (p16INK4a), CEBPA, CHEK2, CTNNA1, DICER1, DIS3L2, EGFR (c.2369C>T, p.Thr790Met variant only), EPCAM (Deletion/duplication testing only), FH,  FLCN, GATA2, GPC3, GREM1 (Promoter region deletion/duplication testing only), HOXB13 (c.251G>A, p.Gly84Glu), HRAS, KIT, MAX, MEN1, MET, MITF (c.952G>A, p.Glu318Lys variant only), MLH1, MSH2, MSH3, MSH6, MUTYH, NBN, NF1, NF2, NTHL1, PALB2, PDGFRA, PHOX2B, PMS2, POLD1, POLE, POT1, PRKAR1A, PTCH1, PTEN, RAD50, RAD51C, RAD51D, RB1, RECQL4, RET, RNF43, RUNX1, SDHAF2, SDHA (sequence changes only), SDHB, SDHC, SDHD, SMAD4, SMARCA4, SMARCB1, SMARCE1, STK11, SUFU, TERC, TERT, TMEM127, TP53, TSC1, TSC2, VHL, WRN and WT1.   PLAN: Ryver has a remote history of breast and endometrial cancers, with no evidence of disease recurrence.  She is likely cured.  She tells me she derives significant reassurance from seeing [place] and also she greatly benefited psychologically from having adequate bras and prostheses.  We will continue to see her on a yearly basis accordingly.  I wrote for her mammography and for her return appointment.  I also wrote her for doxycycline which she should take until the current cyst on the right breast has resolved and may resume if and when additional cysts occur  Total encounter time 20 minutes.Sarajane Jews C. Akshita Italiano, MD 03/06/21 11:09 AM Medical Oncology and Hematology [facility] [place], [place] [address] Tel. [phone]    Fax. [phone]    I, Wilburn Mylar, am acting as scribe for Dr. Virgie Dad. Effie Janoski.  I, Lurline Del MD, have reviewed the above documentation for accuracy and completeness, and I agree with the above.   *Total Encounter Time as defined by the Centers for Medicare and Medicaid Services includes, in addition to the face-to-face time of a patient visit (documented in the note above) non-face-to-face time: obtaining and reviewing outside history, ordering and reviewing medications, tests or procedures, care coordination (communications with other health care professionals or caregivers) and documentation in the medical  record.

## 2021-03-06 ENCOUNTER — Other Ambulatory Visit: Payer: Self-pay

## 2021-03-06 ENCOUNTER — Inpatient Hospital Stay: Payer: Medicare Other | Attending: Oncology | Admitting: Oncology

## 2021-03-06 ENCOUNTER — Inpatient Hospital Stay: Payer: Medicare Other

## 2021-03-06 VITALS — BP 122/85 | HR 78 | Temp 97.3°F | Resp 18 | Ht 66.0 in | Wt 290.9 lb

## 2021-03-06 DIAGNOSIS — C50812 Malignant neoplasm of overlapping sites of left female breast: Secondary | ICD-10-CM

## 2021-03-06 DIAGNOSIS — Z86711 Personal history of pulmonary embolism: Secondary | ICD-10-CM | POA: Diagnosis not present

## 2021-03-06 DIAGNOSIS — Z8542 Personal history of malignant neoplasm of other parts of uterus: Secondary | ICD-10-CM | POA: Insufficient documentation

## 2021-03-06 DIAGNOSIS — Z853 Personal history of malignant neoplasm of breast: Secondary | ICD-10-CM | POA: Diagnosis not present

## 2021-03-06 DIAGNOSIS — Z171 Estrogen receptor negative status [ER-]: Secondary | ICD-10-CM

## 2021-03-06 LAB — COMPREHENSIVE METABOLIC PANEL
ALT: 11 U/L (ref 0–44)
AST: 13 U/L — ABNORMAL LOW (ref 15–41)
Albumin: 3.3 g/dL — ABNORMAL LOW (ref 3.5–5.0)
Alkaline Phosphatase: 62 U/L (ref 38–126)
Anion gap: 9 (ref 5–15)
BUN: 20 mg/dL (ref 8–23)
CO2: 31 mmol/L (ref 22–32)
Calcium: 9 mg/dL (ref 8.9–10.3)
Chloride: 99 mmol/L (ref 98–111)
Creatinine, Ser: 0.85 mg/dL (ref 0.44–1.00)
GFR, Estimated: >60 mL/min (ref >60)
Glucose, Bld: 85 mg/dL (ref 70–99)
Potassium: 3.5 mmol/L (ref 3.5–5.1)
Sodium: 139 mmol/L (ref 135–145)
Total Bilirubin: 0.8 mg/dL (ref 0.3–1.2)
Total Protein: 6.8 g/dL (ref 6.5–8.1)

## 2021-03-06 LAB — CBC WITH DIFFERENTIAL/PLATELET
Abs Immature Granulocytes: 0.01 10*3/uL (ref 0.00–0.07)
Basophils Absolute: 0 10*3/uL (ref 0.0–0.1)
Basophils Relative: 1 %
Eosinophils Absolute: 0.2 10*3/uL (ref 0.0–0.5)
Eosinophils Relative: 2 %
HCT: 38.5 % (ref 36.0–46.0)
Hemoglobin: 12.7 g/dL (ref 12.0–15.0)
Immature Granulocytes: 0 %
Lymphocytes Relative: 42 %
Lymphs Abs: 2.6 10*3/uL (ref 0.7–4.0)
MCH: 30.2 pg (ref 26.0–34.0)
MCHC: 33 g/dL (ref 30.0–36.0)
MCV: 91.7 fL (ref 80.0–100.0)
Monocytes Absolute: 0.3 10*3/uL (ref 0.1–1.0)
Monocytes Relative: 5 %
Neutro Abs: 3.1 10*3/uL (ref 1.7–7.7)
Neutrophils Relative %: 50 %
Platelets: 242 10*3/uL (ref 150–400)
RBC: 4.2 MIL/uL (ref 3.87–5.11)
RDW: 15.8 % — ABNORMAL HIGH (ref 11.5–15.5)
WBC: 6.2 10*3/uL (ref 4.0–10.5)
nRBC: 0 % (ref 0.0–0.2)

## 2021-03-06 MED ORDER — DOXYCYCLINE HYCLATE 100 MG PO TABS: 100.0000 mg | ORAL_TABLET | Freq: Every day | ORAL | 1 refills | Status: DC

## 2021-03-07 LAB — CANCER ANTIGEN 27.29: CA 27.29: 21.4 U/mL (ref 0.0–38.6)

## 2021-04-14 ENCOUNTER — Ambulatory Visit: Payer: Medicare Other

## 2021-04-14 ENCOUNTER — Other Ambulatory Visit: Payer: Medicare Other

## 2021-05-14 ENCOUNTER — Other Ambulatory Visit: Payer: Self-pay | Admitting: Cardiovascular Disease

## 2021-05-16 ENCOUNTER — Other Ambulatory Visit: Payer: Self-pay | Admitting: Oncology

## 2021-05-16 ENCOUNTER — Other Ambulatory Visit: Payer: Self-pay

## 2021-05-16 ENCOUNTER — Ambulatory Visit
Admission: RE | Admit: 2021-05-16 | Discharge: 2021-05-16 | Disposition: A | Discharge status: Home / Self Care | Payer: Medicare Other | Source: Ambulatory Visit | Attending: Oncology | Admitting: Oncology

## 2021-05-16 ENCOUNTER — Other Ambulatory Visit: Payer: Self-pay | Admitting: Hematology and Oncology

## 2021-05-16 DIAGNOSIS — C50812 Malignant neoplasm of overlapping sites of left female breast: Secondary | ICD-10-CM

## 2021-05-16 DIAGNOSIS — Z171 Estrogen receptor negative status [ER-]: Secondary | ICD-10-CM

## 2021-05-17 ENCOUNTER — Ambulatory Visit (INDEPENDENT_AMBULATORY_CARE_PROVIDER_SITE_OTHER): Payer: Medicare Other | Admitting: Cardiovascular Disease

## 2021-05-17 ENCOUNTER — Encounter: Payer: Self-pay | Admitting: Cardiovascular Disease

## 2021-05-17 VITALS — BP 130/82 | HR 74 | Ht 65.0 in | Wt 294.8 lb

## 2021-05-17 DIAGNOSIS — R072 Precordial pain: Secondary | ICD-10-CM

## 2021-05-17 DIAGNOSIS — M3390 Dermatopolymyositis, unspecified, organ involvement unspecified: Secondary | ICD-10-CM | POA: Diagnosis not present

## 2021-05-17 DIAGNOSIS — M79604 Pain in right leg: Secondary | ICD-10-CM

## 2021-05-17 DIAGNOSIS — I10 Essential (primary) hypertension: Secondary | ICD-10-CM | POA: Diagnosis not present

## 2021-05-17 DIAGNOSIS — M79605 Pain in left leg: Secondary | ICD-10-CM

## 2021-05-17 MED ORDER — AMLODIPINE BESYLATE 2.5 MG PO TABS
2.5000 mg | ORAL_TABLET | Freq: Every day | ORAL | 3 refills | Status: DC
Start: 2021-05-17 — End: 2022-04-03

## 2021-05-17 MED ORDER — HYDROCHLOROTHIAZIDE 25 MG PO TABS: 25.0000 mg | ORAL_TABLET | Freq: Every day | ORAL | 1 refills | Status: DC

## 2021-05-17 MED ORDER — LISINOPRIL 40 MG PO TABS: 40.0000 mg | ORAL_TABLET | Freq: Every day | ORAL | 3 refills | Status: DC

## 2021-05-17 NOTE — Patient Instructions (Signed)

## 2021-05-17 NOTE — Progress Notes (Signed)
Cardiology Office Note:    Date:  05/17/2021   ID:  Gail Schmitt, DOB 1961-04-29, MRN [id number]  PCP:  [name], MD  Cardiologist:  Sanda Klein, MD  Electrophysiologist:  None   Referring MD: [name], MD   Chief Complaint  Patient presents with   Follow-up  chest pain; echo  History of Present Illness:    Gail Schmitt is a 61 y.o. 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  She has done well from a cardiac point of view since her last appointment.  Issues with chest pain (with both pleuritic and musculoskeletal pattern) improved when she was treated with anti-inflammatory agents.  She responded well to prednisone which was subsequently replaced with methotrexate, but has required at least 1 recurrent to treatment with steroids for recurrent pain.  She has had a recurrent rash over her shoulders intermittently.  She has occasional ankle swelling especially towards the end of the day.  When an attempt was made to stop her hydrochlorothiazide, her edema worsened and the hydrochlorothiazide has been restarted.  She reports receiving good care from her rheumatology team at [facility], but finds that she has difficulty communicating with her current rheumatology specialist.  She is getting a second opinion.  She was hospitalized in August 2019 with chest pain.  Cardiac enzymes were normal.  Coronary CT angiography was negative for pulmonary embolism.  The coronary calcium score was 0.  Echocardiogram showed borderline LVEF of 50% with mild LVH, but without overt diastolic dysfunction.  Echo did not confirm the reported diagnosis of mitral valve prolapse.  The patient specifically denies any chest pain with  exertion, dyspnea at rest or with exertion, orthopnea, paroxysmal nocturnal dyspnea, syncope, palpitations, focal neurological deficits, intermittent claudication, lower extremity edema, unexplained weight gain, cough, hemoptysis or wheezing.  She is tender to touch over her costochondral joints, especially on the left side.  Notes from rheumatology and orthopedic specialists at [facility]    Past Medical History:  Diagnosis Date   Arthritis    "left hip; hands" (07/28/2014)   Breast cancer, left breast ([place]) 1999   a. s/p left mastectomy   Dermatomyositis ([place])    a. 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[place])    Family history of adverse reaction to anesthesia    "all my daughters get real sick and throw up alot"   Family history of brain cancer    Family history of breast cancer    Family history of stomach cancer    Fatty liver    Headache    "maybe monthly" (07/28/2014)   Heart murmur    Hypertension    Mitral valve prolapse    Normal coronary arteries    last cath 2016   Obesity    Pneumonia    "haven't had it in the last 4 yrs; before that I had it q yr for 4-5 years" (07/28/2014)   Pulmonary embolism ([place]) ~ 2003   RBBB    with symptomatic bradycardia   Tubular adenoma of colon    Unstable angina ([place])    a. Cath ~ 10 + yrs ago, [facility], [place] - "normal but pressures were high."   Uterine cancer ([place]) 2007  Past Surgical History:  Procedure Laterality Date   BREAST BIOPSY Left 1999   CARDIAC CATHETERIZATION  07/13/2011   CHOLECYSTECTOMY N/A 07/31/2014   Procedure: LAPAROSCOPIC CHOLECYSTECTOMY WITH INTRAOPERATIVE CHOLANGIOGRAM;  Surgeon: Erroll Luna, MD;  Location: [place];  Service: General;  Laterality: N/A;   INGUINAL HERNIA REPAIR Left [place] WITH CORONARY ANGIOGRAM N/A 07/13/2011   Procedure: LEFT HEART CATHETERIZATION WITH  CORONARY ANGIOGRAM;  Surgeon: Josue Hector, MD;  Location[facility] CATH LAB;  Service: Cardiovascular;  Laterality: N/A;   LEFT HEART CATHETERIZATION WITH CORONARY ANGIOGRAM N/A 07/30/2014   Procedure: LEFT HEART CATHETERIZATION WITH CORONARY ANGIOGRAM;  Surgeon: [name], MD;  Location: [facility] CATH LAB;  Service: Cardiovascular;  Laterality: N/A;   MASTECTOMY Left 1999   RECONSTRUCTION BREAST IMMEDIATE / DELAYED W/ TISSUE EXPANDER Left 2005   TOTAL ABDOMINAL HYSTERECTOMY  2007   TUBAL LIGATION  1990    Current Medications: Current Meds  Medication Sig   albuterol (VENTOLIN HFA) 108 (90 Base) MCG/ACT inhaler Inhale 1-2 puffs into the lungs every 6 (six) hours as needed for wheezing or shortness of breath.   benzonatate (TESSALON) 100 MG capsule Take 1 capsule (100 mg total) by mouth every 8 (eight) hours.   budesonide-formoterol (SYMBICORT) 160-4.5 MCG/ACT inhaler Inhale 2 puffs into the lungs 2 (two) times daily as needed.   cyclobenzaprine (FLEXERIL) 10 MG tablet Take 10 mg by mouth every 8 (eight) hours.   doxycycline (VIBRA-TABS) 100 MG tablet Take 1 tablet (100 mg total) by mouth daily.   folic acid (FOLVITE) 1 MG tablet Take 1 mg by mouth daily.   latanoprost (XALATAN) 0.005 % ophthalmic solution INSTILL 1 DROP INTO BOTH EYES EVERY DAY AT BEDTIME   lidocaine (LIDODERM) 5 % Place 1 patch onto the skin daily. Remove & Discard patch within 12 hours or as directed by MD   loperamide (IMODIUM) 2 MG capsule Take 2 mg by mouth as needed for diarrhea or loose stools.   loratadine (CLARITIN) 10 MG tablet Take 10 mg by mouth daily.   methotrexate (RHEUMATREX) 2.5 MG tablet Take 10 mg by mouth 2 (two) times a week. On Friday and Saturday   Multiple Vitamin (MULTIVITAMIN WITH MINERALS) TABS tablet Take 1 tablet by mouth daily.   nitroGLYCERIN (NITROSTAT) 0.4 MG SL tablet Place 1 tablet (0.4 mg total) under the tongue every 5 (five) minutes as needed for chest pain.   triamcinolone cream  (KENALOG) 0.1 % APPLY TO AFFECTED AREAS TWO TIMES DAILY   [DISCONTINUED] amLODipine (NORVASC) 2.5 MG tablet Take 1 tablet by mouth once daily   [DISCONTINUED] hydrochlorothiazide (HYDRODIURIL) 25 MG tablet Take 1 tablet by mouth once daily   [DISCONTINUED] lisinopril (ZESTRIL) 40 MG tablet TAKE 1 TABLET BY MOUTH ONCE DAILY . APPOINTMENT REQUIRED FOR FUTURE REFILLS     Allergies:   Meloxicam and Percocet [oxycodone-acetaminophen]   Social History   Socioeconomic History   Marital status: Divorced    Spouse name: Not on file   Number of children: 3   Years of education: Not on file   Highest education level: Not on file  Occupational History   Occupation: [employer] dept  Tobacco Use   Smoking status: Never   Smokeless tobacco: Never  Vaping Use   Vaping Use: Never used  Substance and Sexual Activity   Alcohol use: Not Currently    Alcohol/week: 1.0 standard drink    Types: 1 Standard drinks  or equivalent per week    Comment: 07/28/2014 "might have 1 drink a couple times/month"   Drug use: No   Sexual activity: Not Currently  Other Topics Concern   Not on file  Social History Narrative   Lives @ home in [place] with 1 of her 3 dtrs.  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  Doesn't follow any particular diet.   Social Determinants of Health   Financial Resource Strain: Not on file  Food Insecurity: Not on file  Transportation Needs: Not on file  Physical Activity: Not on file  Stress: Not on file  Social Connections: Not on file     Family History: The patient's family history includes Brain cancer in her paternal aunt and paternal grandmother; Breast cancer in an other family member; Colon polyps in her mother; Diabetes in her sister; Diabetes type II in her mother; Other in her mother; Stroke in her brother. There is no history of Colon cancer.  ROS:   Please see the history of present illness.     All other  systems reviewed and are negative.  EKGs/Labs/Other Studies Reviewed:    The following studies were reviewed today: Notes from December 2022 rheumatology visit at [facility] Normal carotid duplex ultrasound October 2021 at [facility]  EKG:  EKG is ordered today.  It shows normal sinus rhythm, normal ECG. Recent Labs: 03/06/2021: ALT 11; BUN 20; Creatinine, Ser 0.85; Hemoglobin 12.7; Platelets 242; Potassium 3.5; Sodium 139  Recent Lipid Panel    Component Value Date/Time   CHOL 163 12/20/2017 0550   TRIG 56 12/20/2017 0550   HDL 47 12/20/2017 0550   CHOLHDL 3.5 12/20/2017 0550   VLDL 11 12/20/2017 0550   LDLCALC 105 (H) 12/20/2017 0550    Physical Exam:    VS:  BP 130/82    Pulse 74    Ht 5\' 5"  (1.651 m)    Wt 294 lb 12.8 oz (133.7 kg)    SpO2 98%    BMI 49.06 kg/m     Wt Readings from Last 3 Encounters:  05/17/21 294 lb 12.8 oz (133.7 kg)  03/06/21 290 lb 14.4 oz (132 kg)  04/14/20 297 lb (134.7 kg)     General: Alert, oriented x3, no distress, morbidly obese Head: no evidence of trauma, PERRL, EOMI, no exophtalmos or lid lag, no myxedema, no xanthelasma; normal ears, nose and oropharynx Neck: normal jugular venous pulsations and no hepatojugular reflux; brisk carotid pulses without delay and no carotid bruits Chest: clear to auscultation, no signs of consolidation by percussion or palpation, normal fremitus, symmetrical and full respiratory excursions Cardiovascular: normal position and quality of the apical impulse, regular rhythm, normal first and second heart sounds, no murmurs, rubs or gallops Abdomen: no tenderness or distention, no masses by palpation, no abnormal pulsatility or arterial bruits, normal bowel sounds, no hepatosplenomegaly Extremities: no clubbing, cyanosis or edema; 2+ radial, ulnar and brachial pulses bilaterally; 2+ right femoral, posterior tibial and dorsalis pedis pulses; 2+ left femoral, posterior tibial and dorsalis pedis pulses; no subclavian or femoral  bruits Neurological: grossly nonfocal Psych: Normal mood and affect   ASSESSMENT:    1. Precordial pain   2. Essential hypertension   3. Pain in both lower extremities   4. Dermatomyositis ([place])   5. Morbid obesity ([place])     PLAN:    In order of problems listed above:  Chest pain: She had in the past some  symptoms that were pleuritic in nature and may have represented pleuritis or pericarditis, but more recently the symptoms are fairly typical for costochondritis.  She has not had angina pectoris.  She is currently asymptomatic except for mild tenderness to palpation over the left costochondral joints.  Extensive previous coronary work-up was negative.  HTN: Well controlled.  Denies orthostatic hypotension symptoms.  Edema may be partly related to treatment with amlodipine and is expected to worsen during treatment with steroids or NSAIDs..   Leg pain: ABIs are normal.  Symptoms are musculoskeletal, not vascular. Dermatomyositis: Rheumatologist is Dr. Aldona Bar at [facility].  Her rheumatological disorder appears to be connected to her breast cancer diagnosis improved following surgery, but subsequently recurred.  He has been keeping up-to-date with mammograms.  No evidence of cancer has been identified Morbid obesity: Weight loss would be highly beneficial.  We discussed calorie and carbohydrate restriction, the concept of the glycemic index, her limited ability to burn calories through exercise.  The focus should be on her diet.  Medication Adjustments/Labs and Tests Ordered: Current medicines are reviewed at length with the patient today.  Concerns regarding medicines are outlined above.  Orders Placed This Encounter  Procedures   EKG 12-Lead   Meds ordered this encounter  Medications   lisinopril (ZESTRIL) 40 MG tablet    Sig: Take 1 tablet (40 mg total) by mouth daily.    Dispense:  90 tablet    Refill:  3   hydrochlorothiazide (HYDRODIURIL) 25 MG tablet    Sig: Take 1  tablet (25 mg total) by mouth daily.    Dispense:  90 tablet    Refill:  1   amLODipine (NORVASC) 2.5 MG tablet    Sig: Take 1 tablet (2.5 mg total) by mouth daily.    Dispense:  90 tablet    Refill:  3    Patient Instructions  Medication Instructions:  No changes *If you need a refill on your cardiac medications before your next appointment, please call your pharmacy*   Lab Work: None ordered If you have labs (blood work) drawn today and your tests are completely normal, you will receive your results only by: [place] (if you have MyChart) OR A paper copy in the mail If you have any lab test that is abnormal or we need to change your treatment, we will call you to review the results.   Testing/Procedures: None ordered   Follow-Up: At [facility], you and your health needs are our priority.  As part of our continuing mission to provide you with exceptional heart care, we have created designated Provider Care Teams.  These Care Teams include your primary Cardiologist (physician) and Advanced Practice Providers (APPs -  Physician Assistants and Nurse Practitioners) who all work together to provide you with the care you need, when you need it.  We recommend signing up for the patient portal called "MyChart".  Sign up information is provided on this After Visit Summary.  MyChart is used to connect with patients for Virtual Visits (Telemedicine).  Patients are able to view lab/test results, encounter notes, upcoming appointments, etc.  Non-urgent messages can be sent to your provider as well.   To learn more about what you can do with MyChart, go to [email or web address].    Your next appointment:   12 month(s)  The format for your next appointment:   In Person  Provider:   Sanda Klein, MD       Signed, Sanda Klein, MD  05/17/2021 4:36  PM    [facility]

## 2021-07-12 ENCOUNTER — Ambulatory Visit: Payer: Medicare Other | Admitting: Cardiovascular Disease

## 2021-11-20 ENCOUNTER — Telehealth: Payer: Self-pay | Admitting: Cardiovascular Disease

## 2021-11-20 MED ORDER — HYDROCHLOROTHIAZIDE 25 MG PO TABS: 25.0000 mg | ORAL_TABLET | Freq: Every day | ORAL | 1 refills | Status: DC

## 2021-11-20 NOTE — Telephone Encounter (Signed)
*  STAT* If patient is at the pharmacy, call can be transferred to refill team.   1. Which medications need to be refilled? (please list name of each medication and dose if known)  hydrochlorothiazide (HYDRODIURIL) 25 MG tablet  2. Which pharmacy/location (including street and city if local pharmacy) is medication to be sent to? [place], [address]  3. Do they need a 30 day or 90 day supply? 30 days   Patient is visiting her daughter in [place] and took her last pill yesterday

## 2022-02-09 ENCOUNTER — Ambulatory Visit
Admission: EM | Admit: 2022-02-09 | Discharge: 2022-02-09 | Disposition: A | Discharge status: Home / Self Care | Payer: Medicare Other | Attending: Family Medicine | Admitting: Family Medicine

## 2022-02-09 DIAGNOSIS — J22 Unspecified acute lower respiratory infection: Secondary | ICD-10-CM | POA: Diagnosis not present

## 2022-02-09 MED ORDER — AZITHROMYCIN 250 MG PO TABS: ORAL_TABLET | ORAL | 0 refills | Status: DC

## 2022-02-09 MED ORDER — ALBUTEROL SULFATE HFA 108 (90 BASE) MCG/ACT IN AERS: 2.0000 | INHALATION_SPRAY | RESPIRATORY_TRACT | 0 refills | Status: DC | PRN

## 2022-02-09 MED ORDER — PREDNISONE 20 MG PO TABS: 40.0000 mg | ORAL_TABLET | Freq: Every day | ORAL | 0 refills | Status: DC

## 2022-02-09 NOTE — ED Triage Notes (Signed)
chills, sweats, sob, fatigue, body aches, cough, wheezing cold sweats for 6 days.  pt took coriciden, delsyum, nasal spray ,provided a little relief. Dry taste in mouth

## 2022-02-09 NOTE — Discharge Instructions (Signed)
You may continue taking plain Mucinex twice daily, Coricidin HBP and drinking plenty of fluids in addition to your inhaler regimen and what was prescribed today.  Please follow-up right away if you start worsening instead of improving.  I hope you feel better soon.

## 2022-02-09 NOTE — ED Provider Notes (Signed)
[facility]    CSN: [id number] Arrival date & time: 02/09/22  0947      History   Chief Complaint Chief Complaint  Patient presents with   Chills   Shortness of Breath   Cough    HPI MAKINZIE CONSIDINE is a 61 y.o. female.   Patient presenting today with over a week of cough, chest tightness, wheezing, sweats, chills, fever, fatigue, body aches.  Denies chest pain, shortness of breath, abdominal pain, nausea vomiting or diarrhea.  Taking numerous over-the-counter cold and congestion medications with only small amounts of short-term relief.  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    Past Medical History:  Diagnosis Date   Arthritis    "left hip; hands" (07/28/2014)   Breast cancer, left breast ([place]) 1999   a. s/p left mastectomy   Dermatomyositis ([place])    a. 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[place])    Family history of adverse reaction to anesthesia    "all my daughters get real sick and throw up alot"   Family history of brain cancer    Family history of breast cancer    Family history of stomach cancer    Fatty liver    Headache    "maybe monthly" (07/28/2014)   Heart murmur    Hypertension    Mitral valve prolapse    Normal coronary arteries    last cath 2016   Obesity    Pneumonia    "haven't had it in the last 4 yrs; before that I had it q yr for 4-5 years" (07/28/2014)   Pulmonary embolism ([place]) ~ 2003   RBBB    with symptomatic bradycardia   Tubular adenoma of colon    Unstable angina ([place])    a. Cath ~ 10 + yrs ago, [facility], [place] - "normal but pressures were high."   Uterine cancer ([place]) 2007    Patient Active Problem List   Diagnosis Date Noted   Malignant neoplasm of overlapping sites of left  breast in female, estrogen receptor negative ([place]) 07/30/2019   Genetic testing 02/20/2019   History of uterine cancer 02/05/2019   Family history of brain cancer    Family history of stomach cancer    Family history of breast cancer    False positive stress test 04/19/2018   Beta-blockers contraindicated due to bradycardia 01/09/2018   Normal coronary arteries 01/09/2018   History of breast cancer 01/09/2018   History of pulmonary embolus ([place]) 01/09/2018   Dermatomyositis ([place])    Symptomatic cholelithiasis 07/06/2014   Vitamin D deficiency 06/23/2014   Fatty infiltration of liver 06/23/2014   Obesity    Chest pain 07/12/2011   Essential hypertension 07/12/2011    Past Surgical History:  Procedure Laterality Date   BREAST BIOPSY Left 1999   CARDIAC CATHETERIZATION  07/13/2011   CHOLECYSTECTOMY N/A 07/31/2014   Procedure: LAPAROSCOPIC CHOLECYSTECTOMY WITH INTRAOPERATIVE CHOLANGIOGRAM;  Surgeon: Erroll Luna, MD;  Location: [facility];  Service: General;  Laterality: N/A;   INGUINAL HERNIA REPAIR Left 1986   LAPAROSCOPIC TOTAL HYSTERECTOMY     LEFT HEART CATHETERIZATION WITH CORONARY ANGIOGRAM N/A 07/13/2011   Procedure: LEFT HEART CATHETERIZATION WITH CORONARY ANGIOGRAM;  Surgeon: Josue Hector, MD;  Location[facility] C[facility] LAB;  Service: Cardiovascular;  Laterality: N/A;   LEFT HEART CATHETERIZATION WITH CORONARY ANGIOGRAM N/A 07/30/2014   Procedure: LEFT HEART CATHETERIZATION WITH CORONARY ANGIOGRAM;  Surgeon: [name], MD;  Location: [facility] CATH LAB;  Service: Cardiovascular;  Laterality: N/A;   MASTECTOMY Left 1999   RECONSTRUCTION BREAST IMMEDIATE / DELAYED W/ TISSUE EXPANDER Left 2005   TOTAL ABDOMINAL HYSTERECTOMY  2007   TUBAL LIGATION  1990    OB History   No obstetric history on file.      Home Medications    Prior to Admission medications   Medication Sig Start Date End Date Taking? Authorizing Provider  azithromycin (ZITHROMAX) 250 MG tablet Take first 2 tablets  together, then 1 every day until finished. 02/09/22  Yes Volney American, PA-C  predniSONE (DELTASONE) 20 MG tablet Take 2 tablets (40 mg total) by mouth daily with breakfast. 02/09/22  Yes Volney American, PA-C  albuterol (VENTOLIN HFA) 108 (90 Base) MCG/ACT inhaler Inhale 2 puffs into the lungs every 4 (four) hours as needed for wheezing or shortness of breath. 02/09/22   Volney American, PA-C  amLODipine (NORVASC) 2.5 MG tablet Take 1 tablet (2.5 mg total) by mouth daily. 05/17/21   Croitoru, Mihai, MD  benzonatate (TESSALON) 100 MG capsule Take 1 capsule (100 mg total) by mouth every 8 (eight) hours. 01/20/21   Wurst, [place], PA-C  budesonide-formoterol (SYMBICORT) 160-4.5 MCG/ACT inhaler Inhale 2 puffs into the lungs 2 (two) times daily as needed.    [provider]  cyclobenzaprine (FLEXERIL) 10 MG tablet Take 10 mg by mouth every 8 (eight) hours. 06/04/18   [provider]  doxycycline (VIBRA-TABS) 100 MG tablet Take 1 tablet (100 mg total) by mouth daily. 03/06/21   Magrinat, Virgie Dad, MD  folic acid (FOLVITE) 1 MG tablet Take 1 mg by mouth daily.    [provider]  hydrochlorothiazide (HYDRODIURIL) 25 MG tablet Take 1 tablet (25 mg total) by mouth daily. 11/20/21   Croitoru, Mihai, MD  latanoprost (XALATAN) 0.005 % ophthalmic solution INSTILL 1 DROP INTO BOTH EYES EVERY DAY AT BEDTIME 01/05/19   [provider]  lidocaine (LIDODERM) 5 % Place 1 patch onto the skin daily. Remove & Discard patch within 12 hours or as directed by MD 12/08/18   Randal Buba, April, MD  lisinopril (ZESTRIL) 40 MG tablet Take 1 tablet (40 mg total) by mouth daily. 05/17/21   Croitoru, Mihai, MD  loperamide (IMODIUM) 2 MG capsule Take 2 mg by mouth as needed for diarrhea or loose stools.    [provider]  loratadine (CLARITIN) 10 MG tablet Take 10 mg by mouth daily.    [provider]  LORazepam (ATIVAN) 1 MG tablet Take 1 hour prior to MRI and may  repeat if needed. Patient not taking: Reported on 05/17/2021 06/22/19   Magrinat, Virgie Dad, MD  methotrexate (RHEUMATREX) 2.5 MG tablet Take 10 mg by mouth 2 (two) times a week. On Friday and Saturday 03/04/18   [provider]  Multiple Vitamin (MULTIVITAMIN WITH MINERALS) TABS tablet Take 1 tablet by mouth daily.    [provider]  nitroGLYCERIN (NITROSTAT) 0.4 MG SL tablet Place 1 tablet (0.4 mg total) under the tongue every 5 (five) minutes as needed for chest pain. 12/21/17 05/17/21  Eileen Stanford, PA-C  omeprazole (PRILOSEC) 20 MG capsule Take 1 capsule (20 mg total) by mouth daily. Patient not taking: Reported on 05/17/2021 12/08/18   Palumbo, April, MD  pregabalin (LYRICA) 50 MG  capsule Take by mouth at bedtime. 02/15/20   [provider]  triamcinolone cream (KENALOG) 0.1 % APPLY TO AFFECTED AREAS TWO TIMES DAILY 01/28/19   [provider]    Family History Family History  Problem Relation Age of Onset   Diabetes type II Mother    Colon polyps Mother    Other Mother        cholangiocarcinoma   Diabetes Sister    Stroke Brother    Brain cancer Paternal Aunt    Brain cancer Paternal Grandmother    Breast cancer Other    Colon cancer Neg Hx     Social History Social History   Tobacco Use   Smoking status: Never   Smokeless tobacco: Never  Vaping Use   Vaping Use: Never used  Substance Use Topics   Alcohol use: Not Currently    Alcohol/week: 1.0 standard drink of alcohol    Types: 1 Standard drinks or equivalent per week    Comment: 07/28/2014 "might have 1 drink a couple times/month"   Drug use: No     Allergies   Meloxicam and Percocet [oxycodone-acetaminophen]   Review of Systems Review of Systems Per HPI  Physical Exam Triage Vital Signs ED Triage Vitals  Enc Vitals Group     BP 02/09/22 0954 119/86     Pulse Rate 02/09/22 0954 85     Resp 02/09/22 0956 20     Temp 02/09/22 0954 98.6 F (37 C)     Temp src --       SpO2 02/09/22 0954 96 %     Weight --      Height --      Head Circumference --      Peak Flow --      Pain Score 02/09/22 0957 2     Pain Loc --      Pain Edu? --      Excl. in [place]? --    No data found.  Updated Vital Signs BP 119/86 (BP Location: Right Arm)   Pulse 85   Temp 98.6 F (37 C)   Resp 20   SpO2 96%   Visual Acuity Right Eye Distance:   Left Eye Distance:   Bilateral Distance:    Right Eye Near:   Left Eye Near:    Bilateral Near:     Physical Exam Vitals and nursing note reviewed.  Constitutional:      Appearance: Normal appearance. She is not ill-appearing.  HENT:     Head: Atraumatic.     Right Ear: Tympanic membrane normal.     Left Ear: Tympanic membrane normal.     Nose: Rhinorrhea present.     Mouth/Throat:     Mouth: Mucous membranes are moist.     Pharynx: Posterior oropharyngeal erythema present. No oropharyngeal exudate.  Eyes:     Extraocular Movements: Extraocular movements intact.     Conjunctiva/sclera: Conjunctivae normal.  Cardiovascular:     Rate and Rhythm: Normal rate and regular rhythm.     Heart sounds: Normal heart sounds.  Pulmonary:     Effort: Pulmonary effort is normal.     Breath sounds: Normal breath sounds. No wheezing or rales.  Musculoskeletal:        General: Normal range of motion.     Cervical back: Normal range of motion and neck supple.  Skin:    General: Skin is warm and dry.  Neurological:     Mental Status: She is alert and  oriented to person, place, and time.  Psychiatric:        Mood and Affect: Mood normal.        Thought Content: Thought content normal.        Judgment: Judgment normal.    UC Treatments / Results  Labs (all labs ordered are listed, but only abnormal results are displayed) Labs Reviewed - No data to display  EKG   Radiology No results found.  Procedures Procedures (including critical care time)  Medications Ordered in UC Medications - No data to display  Initial  Impression / Assessment and Plan / UC Course  I have reviewed the triage vital signs and the nursing notes.  Pertinent labs & imaging results that were available during my care of the patient were reviewed by me and considered in my medical decision making (see chart for details).     Vital signs benign and reassuring today, suspect initially COVID-like illness now progressing past a week and worsening.  Given this and her underlying medical conditions, will cover with Zithromax, prednisone, albuterol inhaler as well as her Symbicort regimen and over-the-counter supportive medications and home care.  Close follow-up recommended.  Return for worsening symptoms.  Final Clinical Impressions(s) / UC Diagnoses   Final diagnoses:  Lower respiratory infection     Discharge Instructions      You may continue taking plain Mucinex twice daily, Coricidin HBP and drinking plenty of fluids in addition to your inhaler regimen and what was prescribed today.  Please follow-up right away if you start worsening instead of improving.  I hope you feel better soon.    ED Prescriptions     Medication Sig Dispense Auth. Provider   azithromycin (ZITHROMAX) 250 MG tablet Take first 2 tablets together, then 1 every day until finished. 6 tablet Volney American, [place]   predniSONE (DELTASONE) 20 MG tablet Take 2 tablets (40 mg total) by mouth daily with breakfast. 10 tablet Volney American, PA-C   albuterol (VENTOLIN HFA) 108 (90 Base) MCG/ACT inhaler Inhale 2 puffs into the lungs every 4 (four) hours as needed for wheezing or shortness of breath. 18 g Volney American, [place]      PDMP not reviewed this encounter.   Volney American, [place] 02/09/22 1519

## 2022-03-06 ENCOUNTER — Other Ambulatory Visit: Payer: Self-pay | Admitting: *Deleted

## 2022-03-06 DIAGNOSIS — Z853 Personal history of malignant neoplasm of breast: Secondary | ICD-10-CM

## 2022-03-07 ENCOUNTER — Inpatient Hospital Stay: Payer: Medicare Other | Attending: Hematology and Oncology

## 2022-03-07 ENCOUNTER — Other Ambulatory Visit: Payer: Medicare Other

## 2022-03-07 ENCOUNTER — Ambulatory Visit: Payer: Medicare Other | Admitting: Hematology and Oncology

## 2022-03-07 ENCOUNTER — Inpatient Hospital Stay (HOSPITAL_BASED_OUTPATIENT_CLINIC_OR_DEPARTMENT_OTHER): Payer: Medicare Other | Admitting: Hematology and Oncology

## 2022-03-07 ENCOUNTER — Encounter: Payer: Self-pay | Admitting: Hematology and Oncology

## 2022-03-07 VITALS — BP 124/77 | HR 89 | Temp 97.9°F | Resp 16 | Ht 65.0 in | Wt 264.3 lb

## 2022-03-07 DIAGNOSIS — C50812 Malignant neoplasm of overlapping sites of left female breast: Secondary | ICD-10-CM

## 2022-03-07 DIAGNOSIS — Z171 Estrogen receptor negative status [ER-]: Secondary | ICD-10-CM | POA: Diagnosis not present

## 2022-03-07 DIAGNOSIS — Z86711 Personal history of pulmonary embolism: Secondary | ICD-10-CM | POA: Insufficient documentation

## 2022-03-07 DIAGNOSIS — Z853 Personal history of malignant neoplasm of breast: Secondary | ICD-10-CM | POA: Diagnosis not present

## 2022-03-07 LAB — CBC WITH DIFFERENTIAL (CANCER CENTER ONLY)
Abs Immature Granulocytes: 0.01 10*3/uL (ref 0.00–0.07)
Basophils Absolute: 0 10*3/uL (ref 0.0–0.1)
Basophils Relative: 1 %
Eosinophils Absolute: 0.2 10*3/uL (ref 0.0–0.5)
Eosinophils Relative: 5 %
HCT: 39.4 % (ref 36.0–46.0)
Hemoglobin: 13.3 g/dL (ref 12.0–15.0)
Immature Granulocytes: 0 %
Lymphocytes Relative: 35 %
Lymphs Abs: 1.4 10*3/uL (ref 0.7–4.0)
MCH: 31.1 pg (ref 26.0–34.0)
MCHC: 33.8 g/dL (ref 30.0–36.0)
MCV: 92.1 fL (ref 80.0–100.0)
Monocytes Absolute: 0.3 10*3/uL (ref 0.1–1.0)
Monocytes Relative: 7 %
Neutro Abs: 2.1 10*3/uL (ref 1.7–7.7)
Neutrophils Relative %: 52 %
Platelet Count: 282 10*3/uL (ref 150–400)
RBC: 4.28 MIL/uL (ref 3.87–5.11)
RDW: 13.6 % (ref 11.5–15.5)
WBC Count: 4.1 10*3/uL (ref 4.0–10.5)
nRBC: 0 % (ref 0.0–0.2)

## 2022-03-07 LAB — CMP (CANCER CENTER ONLY)
ALT: 9 U/L (ref 0–44)
AST: 15 U/L (ref 15–41)
Albumin: 4 g/dL (ref 3.5–5.0)
Alkaline Phosphatase: 69 U/L (ref 38–126)
Anion gap: 7 (ref 5–15)
BUN: 15 mg/dL (ref 8–23)
CO2: 32 mmol/L (ref 22–32)
Calcium: 9.5 mg/dL (ref 8.9–10.3)
Chloride: 100 mmol/L (ref 98–111)
Creatinine: 0.82 mg/dL (ref 0.44–1.00)
GFR, Estimated: >60 mL/min (ref >60)
Glucose, Bld: 174 mg/dL — ABNORMAL HIGH (ref 70–99)
Potassium: 3 mmol/L — ABNORMAL LOW (ref 3.5–5.1)
Sodium: 139 mmol/L (ref 135–145)
Total Bilirubin: 0.3 mg/dL (ref 0.3–1.2)
Total Protein: 7.7 g/dL (ref 6.5–8.1)

## 2022-03-07 NOTE — Progress Notes (Signed)
[place]  Telephone:([phone]) [phone] Fax:(336) [phone]    ID: Gail Schmitt DOB: 22-Jun-1960  MR#: [id number]  LTJ#:[id number]  Patient Care Team: [name], MD as PCP - General (Family Medicine) Croitoru, Dani Gobble, MD as PCP - Cardiology (Cardiology) Donzetta Starch, MD as PCP - Infectious Diseases (Physical Medicine and Rehabilitation) Ala Dach, MD as Referring Physician (Hematology and Oncology) Jamesetta So, MD as Referring Physician (Specialist) Pyrtle, Lajuan Lines, MD as Consulting Physician (Gastroenterology) Benay Pike, MD as Consulting Physician (Hematology and Oncology) OTHER MD:   CHIEF COMPLAINT: 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  She continues under observation. Since her last visit she had mammogram of the right breast which did not show any findings suspicious for malignancy.  She denies any breast changes herself.  She has been trying to lose weight and lost almost 50 pounds or more since her last visit, she had followed some strict diet changes which have been tremendously helpful.  She otherwise denies any new health complaints.  Rest of the pertinent 10 point ROS reviewed and negative   COVID 19 VACCINATION STATUS: vaccinated x2    HISTORY OF CURRENT ILLNESS: From the original intake note:  LAVONN Schmitt has a history of left breast cancer diagnosed in 1999. Per [name] notes, her cancer was T2N1bM0, ERPR negative, HER2 negative, grade 3. She was treated under [name] in [facility] with a mastectomy and axillary dissection and 4 cycles of Adriamycin and Cytoxan. 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   More recently she reported local pain along the lateral aspect of the right breast. She underwent a digital diagnostic right mammogram with  tomography and an ultrasound of the right breast on 01/14/2019 showing: Breast Density Category B. There are no suspicious masses, areas of architectural distortion, areas of significant asymmetry or suspicious calcifications. No mammographic change. There is no mammographic abnormality in the region of the patient's focal breast pain. On physical exam, patient is tender to palpation along the far lateral aspect of the right breast. There is no palpable mass. Targeted ultrasound is performed, showing normal tissue throughout the lateral aspect of the right breast in the area of the patient's focal pain. No mass or suspicious lesion.  The patient's subsequent history is as detailed below.   PAST MEDICAL HISTORY: Past Medical History:  Diagnosis Date   Arthritis    "left hip; hands" (07/28/2014)   Breast cancer, left breast ([place]) 1999   a. s/p left mastectomy   Dermatomyositis ([place])    a. 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[place])    Family history of adverse reaction to anesthesia    "all my daughters get real sick and throw up alot"   Family history of brain cancer    Family history of breast cancer    Family history of stomach cancer    Fatty liver    Headache    "maybe monthly" (07/28/2014)   Heart murmur    Hypertension    Mitral valve prolapse    Normal coronary arteries    last cath 2016   Obesity    Pneumonia    "haven't had it in the last 4 yrs; before that I had it q yr for 4-5 years" (07/28/2014)  Pulmonary embolism ([place]) ~ 2003   RBBB    with symptomatic bradycardia   Tubular adenoma of colon    Unstable angina ([place])    a. Cath ~ 10 + yrs ago, [facility], [place] - "normal but pressures were high."   Uterine cancer ([place]) 2007    PAST SURGICAL HISTORY: Past Surgical History:  Procedure Laterality Date   BREAST BIOPSY Left 1999   CARDIAC CATHETERIZATION  07/13/2011   CHOLECYSTECTOMY N/A  07/31/2014   Procedure: LAPAROSCOPIC CHOLECYSTECTOMY WITH INTRAOPERATIVE CHOLANGIOGRAM;  Surgeon: Erroll Luna, MD;  Location: [facility];  Service: General;  Laterality: N/A;   INGUINAL HERNIA REPAIR Left [place] WITH CORONARY ANGIOGRAM N/A 07/13/2011   Procedure: LEFT HEART CATHETERIZATION WITH CORONARY ANGIOGRAM;  Surgeon: Josue Hector, MD;  Location: [facility] CATH LAB;  Service: Cardiovascular;  Laterality: N/A;   LEFT HEART CATHETERIZATION WITH CORONARY ANGIOGRAM N/A 07/30/2014   Procedure: LEFT HEART CATHETERIZATION WITH CORONARY ANGIOGRAM;  Surgeon: [name], MD;  Location: [facility] CATH LAB;  Service: Cardiovascular;  Laterality: N/A;   MASTECTOMY Left 1999   RECONSTRUCTION BREAST IMMEDIATE / DELAYED W/ TISSUE EXPANDER Left 2005   TOTAL ABDOMINAL HYSTERECTOMY  2007   TUBAL LIGATION  1990    FAMILY HISTORY: Family History  Problem Relation Age of Onset   Diabetes type II Mother    Colon polyps Mother    Other Mother        cholangiocarcinoma   Diabetes Sister    Stroke Brother    Brain cancer Paternal Aunt    Brain cancer Paternal Grandmother    Breast cancer Other    Colon cancer Neg Hx   Gail Schmitt's father is living as of September 2020, in his mid 53s. Patients' mother died from an "unusual stomach cancer" at the age of 78.. The patient has 1 brothers and 2 sisters.  One maternal great aunts had breast cancer.  Patient denies anyone in her family having ovarian, prostate, or pancreatic cancer.    GYNECOLOGIC HISTORY:  No LMP recorded. Patient has had a hysterectomy. 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  Her husband [name] is disabled secondary to a stroke.  It is just the  2 of them at home.  The patient's oldest daughter Varney Daily lives in [place] and has her own business.  Daughter Ellin Saba lives in [place] and works for a bank.  Dr. Parke Poisson lives in [place] and is disabled secondary to Asperger's.  The patient has 4 grandchildren  ADVANCED DIRECTIVES: The patient tells me her daughters [name] and Leisure centre manager and are together her healthcare power of attorney   HEALTH MAINTENANCE: Social History   Tobacco Use   Smoking status: Never   Smokeless tobacco: Never  Vaping Use   Vaping Use: Never used  Substance Use Topics   Alcohol use: Not Currently    Alcohol/week: 1.0 standard drink of alcohol    Types: 1 Standard drinks or equivalent per week    Comment: 07/28/2014 "might have 1 drink a couple times/month"   Drug use: No    Colonoscopy: November 2019/ Pyrtle  PAP: STATUS post hysterectomy  Bone density: no   Allergies  Allergen Reactions   Meloxicam Other (See Comments)    Made her hurt more   Percocet [Oxycodone-Acetaminophen] Itching and Rash    Current Outpatient Medications  Medication Sig Dispense Refill   albuterol (VENTOLIN HFA) 108 (90 Base) MCG/ACT inhaler Inhale 2 puffs into the lungs every 4 (four) hours as needed for wheezing or shortness of breath. 18 g 0   amLODipine (NORVASC) 2.5 MG tablet Take 1 tablet (2.5 mg total) by mouth daily. 90 tablet 3   azithromycin (ZITHROMAX) 250 MG tablet Take first 2 tablets together, then 1 every day until finished. 6 tablet 0   benzonatate (TESSALON) 100 MG capsule Take 1 capsule (100 mg total) by mouth every 8 (eight) hours. 21 capsule 0   budesonide-formoterol (SYMBICORT) 160-4.5 MCG/ACT inhaler Inhale 2 puffs into the lungs 2 (two) times daily as needed.     cyclobenzaprine (FLEXERIL) 10 MG tablet Take 10 mg by mouth every 8 (eight) hours.     doxycycline (VIBRA-TABS) 100 MG tablet Take 1 tablet (100 mg total) by mouth daily. 40 tablet 1   folic acid  (FOLVITE) 1 MG tablet Take 1 mg by mouth daily.     hydrochlorothiazide (HYDRODIURIL) 25 MG tablet Take 1 tablet (25 mg total) by mouth daily. 90 tablet 1   latanoprost (XALATAN) 0.005 % ophthalmic solution INSTILL 1 DROP INTO BOTH EYES EVERY DAY AT BEDTIME     lidocaine (LIDODERM) 5 % Place 1 patch onto the skin daily. Remove & Discard patch within 12 hours or as directed by MD 14 patch 0   lisinopril (ZESTRIL) 40 MG tablet Take 1 tablet (40 mg total) by mouth daily. 90 tablet 3   loperamide (IMODIUM) 2 MG capsule Take 2 mg by mouth as needed for diarrhea or loose stools.     loratadine (CLARITIN) 10 MG tablet Take 10 mg by mouth daily.     LORazepam (ATIVAN) 1 MG tablet Take 1 hour prior to MRI and may repeat if needed. (Patient not taking: Reported on 05/17/2021) 3 tablet 1   methotrexate (RHEUMATREX) 2.5 MG tablet Take 10 mg by mouth 2 (two) times a week. On Friday and Saturday  2   Multiple Vitamin (MULTIVITAMIN WITH MINERALS) TABS tablet Take 1 tablet by mouth daily.     nitroGLYCERIN (NITROSTAT) 0.4 MG SL tablet Place 1 tablet (0.4 mg total) under the tongue every 5 (five) minutes as needed for chest pain. 25 tablet 3   omeprazole (PRILOSEC) 20 MG capsule Take 1 capsule (20 mg total) by mouth daily. (Patient not taking: Reported on 05/17/2021) 30 capsule 0   predniSONE (DELTASONE) 20 MG tablet Take 2 tablets (40 mg total) by mouth daily with breakfast. 10 tablet 0   pregabalin (LYRICA) 50 MG capsule Take by mouth at bedtime.     triamcinolone cream (KENALOG) 0.1 % APPLY TO AFFECTED AREAS TWO TIMES DAILY     No current facility-administered medications for this visit.     OBJECTIVE: African-American woman who appears stated age  23:   03/07/22 1336  BP: 124/77  Pulse: 89  Resp: 16  Temp: 97.9 F (36.6 C)  SpO2: 100%    Wt Readings from Last 3 Encounters:  03/07/22 264 lb 4.8 oz (119.9 kg)  05/17/21 294 lb 12.8 oz (133.7 kg)  03/06/21 290 lb 14.4 oz (132 kg)   Body mass  index is 43.98 kg/m.    ECOG FS:2 - Symptomatic, <50% confined to bed  Physical Exam Constitutional:  Appearance: Normal appearance.     Comments: Walks with a walker  Chest:     Comments: Bilateral breasts inspected.  She is status post left mastectomy.  No concern for recurrence.  No palpable masses or regional adenopathy.  Right breast normal to inspection and palpation. Musculoskeletal:        General: No swelling.     Cervical back: Normal range of motion and neck supple. No rigidity.  Lymphadenopathy:     Cervical: No cervical adenopathy.  Skin:    General: Skin is warm and dry.  Neurological:     Mental Status: She is alert.     LAB RESULTS:  CMP     Component Value Date/Time   NA 139 03/06/2021 1018   K 3.5 03/06/2021 1018   CL 99 03/06/2021 1018   CO2 31 03/06/2021 1018   GLUCOSE 85 03/06/2021 1018   BUN 20 03/06/2021 1018   CREATININE 0.85 03/06/2021 1018   CREATININE 0.71 02/21/2016 1119   CALCIUM 9.0 03/06/2021 1018   PROT 6.8 03/06/2021 1018   ALBUMIN 3.3 (L) 03/06/2021 1018   AST 13 (L) 03/06/2021 1018   ALT 11 03/06/2021 1018   ALKPHOS 62 03/06/2021 1018   BILITOT 0.8 03/06/2021 1018   GFRNONAA >60 03/06/2021 1018   GFRNONAA >89 07/04/2014 1042   GFRAA >60 02/01/2020 1054   GFRAA >89 07/04/2014 1042    No results found for: "TOTALPROTELP", "ALBUMINELP", "A1GS", "A2GS", "BETS", "BETA2SER", "GAMS", "MSPIKE", "SPEI"  No results found for: "KPAFRELGTCHN", "LAMBDASER", "KAPLAMBRATIO"  Lab Results  Component Value Date   WBC 4.1 03/07/2022   NEUTROABS 2.1 03/07/2022   HGB 13.3 03/07/2022   HCT 39.4 03/07/2022   MCV 92.1 03/07/2022   PLT 282 03/07/2022    No results found for: "LABCA2"  No components found for: "BRAXEN407"  No results for input(s): "INR" in the last 168 hours.  No results found for: "LABCA2"  No results found for: "WKG881"  No results found for: "CAN125"  No results found for: "CAN153"  Lab Results  Component  Value Date   CA2729 21.4 03/06/2021    No components found for: "HGQUANT"  No results found for: "CEA1", "CEA" / No results found for: "CEA1", "CEA"   No results found for: "AFPTUMOR"  No results found for: "CHROMOGRNA"  No results found for: "HGBA", "HGBA2QUANT", "HGBFQUANT", "HGBSQUAN" (Hemoglobinopathy evaluation)   No results found for: "LDH"  No results found for: "IRON", "TIBC", "IRONPCTSAT" (Iron and TIBC)  No results found for: "FERRITIN"  Urinalysis    Component Value Date/Time   LABSPEC >=1.030 09/18/2017 1123   PHURINE 6.5 09/18/2017 1123   GLUCOSEU NEGATIVE 09/18/2017 1123   HGBUR NEGATIVE 09/18/2017 [place] 09/18/2017 1123   BILIRUBINUR negative 04/09/2016 1803   BILIRUBINUR Negative 08/20/2014 1444   KETONESUR NEGATIVE 09/18/2017 1123   PROTEINUR NEGATIVE 09/18/2017 1123   UROBILINOGEN 0.2 09/18/2017 1123   NITRITE NEGATIVE 09/18/2017 1123   LEUKOCYTESUR TRACE (A) 09/18/2017 1123    STUDIES:  No results found.   ELIGIBLE FOR AVAILABLE RESEARCH PROTOCOL: No   ASSESSMENT: 61 y.o. [place], [place] woman with a long history of dermatomyositis, as well as breast cancer, as follows  (1) status post left mastectomy and axillary lymph node dissection 1999 for a T2 N1b, stage IIIB invasive breast cancer, triple negative  (a) status post cyclophosphamide and doxorubicin x4  (b) did not receive adjuvant radiation secondary to history of myositis  (2) status post hysterectomy and bilateral salpingo-oophorectomy  2004 for what seems to have been a stage I endometrial carcinoma  (3) Genetic testing reported out on 02/20/2019 through the [facility] Multi- cancer panel found no pathogenic mutations. The Multi-Cancer Panel offered by [facility] includes sequencing and/or deletion duplication testing of the following 85 genes: AIP, ALK, APC, ATM, AXIN2,BAP1,  BARD1, BLM, BMPR1A, BRCA1, BRCA2, BRIP1, CASR, CDC73, CDH1, CDK4, CDKN1B, CDKN1C, CDKN2A (p14ARF),  CDKN2A (p16INK4a), CEBPA, CHEK2, CTNNA1, DICER1, DIS3L2, EGFR (c.2369C>T, p.Thr790Met variant only), EPCAM (Deletion/duplication testing only), FH, FLCN, GATA2, GPC3, GREM1 (Promoter region deletion/duplication testing only), HOXB13 (c.251G>A, p.Gly84Glu), HRAS, KIT, MAX, MEN1, MET, MITF (c.952G>A, p.Glu318Lys variant only), MLH1, MSH2, MSH3, MSH6, MUTYH, NBN, NF1, NF2, NTHL1, PALB2, PDGFRA, PHOX2B, PMS2, POLD1, POLE, POT1, PRKAR1A, PTCH1, PTEN, RAD50, RAD51C, RAD51D, RB1, RECQL4, RET, RNF43, RUNX1, SDHAF2, SDHA (sequence changes only), SDHB, SDHC, SDHD, SMAD4, SMARCA4, SMARCB1, SMARCE1, STK11, SUFU, TERC, TERT, TMEM127, TP53, TSC1, TSC2, VHL, WRN and WT1.   PLAN: Patient is currently doing well on observation.  Her last mammogram was without any concern for recurrence.  No clinical signs concerning for recurrence. Physical examination today without any findings.  No palpable masses or regional adenopathy. She will continue mammograms annually. She can return to clinic in 1 year or sooner as needed.  I applauded her on the weight loss.  Total time spent: 20 minutes  *Total Encounter Time as defined by the Centers for Medicare and Medicaid Services includes, in addition to the face-to-face time of a patient visit (documented in the note above) non-face-to-face time: obtaining and reviewing outside history, ordering and reviewing medications, tests or procedures, care coordination (communications with other health care professionals or caregivers) and documentation in the medical record.

## 2022-04-02 ENCOUNTER — Other Ambulatory Visit: Payer: Self-pay | Admitting: Cardiovascular Disease

## 2022-04-06 ENCOUNTER — Ambulatory Visit
Admission: RE | Admit: 2022-04-06 | Discharge: 2022-04-06 | Disposition: A | Discharge status: Home / Self Care | Payer: Medicare Other | Source: Ambulatory Visit | Attending: Hematology and Oncology | Admitting: Hematology and Oncology

## 2022-04-06 ENCOUNTER — Encounter: Payer: Self-pay | Admitting: Cardiovascular Disease

## 2022-04-06 DIAGNOSIS — Z171 Estrogen receptor negative status [ER-]: Secondary | ICD-10-CM

## 2022-04-06 NOTE — Telephone Encounter (Signed)
Spoke with pt to make yearly follow up appointment. Pt verbalizes understanding.

## 2022-05-09 ENCOUNTER — Encounter: Payer: Self-pay | Admitting: Cardiovascular Disease

## 2022-05-09 ENCOUNTER — Ambulatory Visit: Payer: Medicare Other | Attending: Cardiovascular Disease | Admitting: Cardiovascular Disease

## 2022-05-09 VITALS — BP 136/82 | HR 68 | Ht 66.0 in | Wt 267.6 lb

## 2022-05-09 DIAGNOSIS — I10 Essential (primary) hypertension: Secondary | ICD-10-CM

## 2022-05-09 DIAGNOSIS — M3390 Dermatopolymyositis, unspecified, organ involvement unspecified: Secondary | ICD-10-CM | POA: Diagnosis not present

## 2022-05-09 DIAGNOSIS — R072 Precordial pain: Secondary | ICD-10-CM | POA: Diagnosis not present

## 2022-05-09 NOTE — Progress Notes (Signed)
Cardiology Office Note:    Date:  05/11/2022   ID:  Gail Schmitt, DOB [birth year], MRN [id number]  PCP:  [name], MD  Cardiologist:  Sanda Klein, MD  Electrophysiologist:  None   Referring MD: [name], MD   Chief Complaint  Patient presents with   Follow-up    History of Present Illness:    Gail Schmitt is a 63 y.o. 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  Recently she has had a facial rash as well as a rash on her back in addition to chronic musculoskeletal complaints.  She has not had any chest pain recently, either pleuritic or musculoskeletal or anginal.  She has not had exertional angina or dyspnea, does not have lower extremity edema, orthopnea or PND.  Denies palpitations, dizziness or syncope.  She is very proud that she has managed to lose over 60 pounds in the last year.  She is doing this by watching her food portions and eating carefully.  When her musculoskeletal symptoms improved she does try to exercise.  Labs over the last year have shown normal CK values (89-178) and mildly elevated CRP (1.21-1.80) and ESR (33-56).  Most recent CBC with all values in normal range.  Recent chemistries with normal liver function tests and normal renal parameters.   She was hospitalized in August 2019 with chest pain.  Cardiac enzymes were normal.  Coronary CT angiography was negative for pulmonary embolism.  The coronary calcium score was 0.  Echocardiogram showed borderline LVEF of 50%  with mild LVH, but without overt diastolic dysfunction.  Echo did not confirm the reported diagnosis of mitral valve prolapse.   Past Medical History:  Diagnosis Date   Arthritis    "left hip; hands" (07/28/2014)   Breast cancer, left breast ([place]) 1999   a. s/p left mastectomy   Dermatomyositis ([place])    a. 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[place])    Family history of adverse reaction to anesthesia    "all my daughters get real sick and throw up alot"   Family history of brain cancer    Family history of breast cancer    Family history of stomach cancer    Fatty liver    Headache    "maybe monthly" (07/28/2014)   Heart murmur    Hypertension    Mitral valve prolapse    Normal coronary arteries    last cath 2016   Obesity    Pneumonia    "haven't had it in the last 4 yrs; before that I had it q yr for 4-5 years" (07/28/2014)   Pulmonary embolism ([place]) ~ 2003   RBBB    with symptomatic bradycardia   Tubular adenoma of colon    Unstable angina ([place])    a. Cath ~ 10 + yrs ago, [facility], [place] - "normal but pressures were high."   Uterine cancer ([place]) 2007    Past Surgical History:  Procedure Laterality Date   BREAST BIOPSY Left 1999   CARDIAC CATHETERIZATION  07/13/2011  CHOLECYSTECTOMY N/A 07/31/2014   Procedure: LAPAROSCOPIC CHOLECYSTECTOMY WITH INTRAOPERATIVE CHOLANGIOGRAM;  Surgeon: Erroll Luna, MD;  Location: [facility];  Service: General;  Laterality: N/A;   INGUINAL HERNIA REPAIR Left [place] WITH CORONARY ANGIOGRAM N/A 07/13/2011   Procedure: LEFT HEART CATHETERIZATION WITH CORONARY ANGIOGRAM;  Surgeon: Josue Hector, MD;  Location: [facility] CATH LAB;  Service: Cardiovascular;  Laterality: N/A;   LEFT HEART CATHETERIZATION WITH CORONARY ANGIOGRAM N/A 07/30/2014   Procedure: LEFT HEART CATHETERIZATION WITH CORONARY ANGIOGRAM;  Surgeon:  [name], MD;  Location: [facility] CATH LAB;  Service: Cardiovascular;  Laterality: N/A;   MASTECTOMY Left 1999   RECONSTRUCTION BREAST IMMEDIATE / DELAYED W/ TISSUE EXPANDER Left 2005   TOTAL ABDOMINAL HYSTERECTOMY  2007   TUBAL LIGATION  1990    Current Medications: Current Meds  Medication Sig   amLODipine (NORVASC) 2.5 MG tablet Take 1 tablet by mouth once daily   doxycycline (VIBRA-TABS) 100 MG tablet Take 1 tablet (100 mg total) by mouth daily.   folic acid (FOLVITE) 1 MG tablet Take 1 mg by mouth daily.   hydrochlorothiazide (HYDRODIURIL) 25 MG tablet Take 1 tablet (25 mg total) by mouth daily.   latanoprost (XALATAN) 0.005 % ophthalmic solution INSTILL 1 DROP INTO BOTH EYES EVERY DAY AT BEDTIME   lidocaine (LIDODERM) 5 % Place 1 patch onto the skin daily. Remove & Discard patch within 12 hours or as directed by MD   lisinopril (ZESTRIL) 40 MG tablet Take 1 tablet (40 mg total) by mouth daily.   loperamide (IMODIUM) 2 MG capsule Take 2 mg by mouth as needed for diarrhea or loose stools.   loratadine (CLARITIN) 10 MG tablet Take 10 mg by mouth daily.   methotrexate (RHEUMATREX) 2.5 MG tablet Take 2.5 mg by mouth once a week. & tablets   Multiple Vitamin (MULTIVITAMIN WITH MINERALS) TABS tablet Take 1 tablet by mouth daily.   omeprazole (PRILOSEC) 20 MG capsule Take 1 capsule (20 mg total) by mouth daily.   triamcinolone cream (KENALOG) 0.1 % APPLY TO AFFECTED AREAS TWO TIMES DAILY     Allergies:   Meloxicam and Percocet [oxycodone-acetaminophen]   Social History   Socioeconomic History   Marital status: Divorced    Spouse name: Not on file   Number of children: 3   Years of education: Not on file   Highest education level: Not on file  Occupational History   Occupation: [employer] dept  Tobacco Use   Smoking status: Never   Smokeless tobacco: Never  Vaping Use   Vaping Use: Never used  Substance and Sexual Activity   Alcohol use: Not Currently     Alcohol/week: 1.0 standard drink of alcohol    Types: 1 Standard drinks or equivalent per week    Comment: 07/28/2014 "might have 1 drink a couple times/month"   Drug use: No   Sexual activity: Not Currently  Other Topics Concern   Not on file  Social History Narrative   Lives @ home in [place] with 1 of her 3 dtrs.  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  Doesn't follow any particular diet.   Social Determinants of Health   Financial Resource Strain: Not on file  Food Insecurity: Not on file  Transportation Needs: Not on file  Physical Activity: Not on file  Stress: Not on file  Social Connections: Not on file  Family History: The patient's family history includes Brain cancer in her paternal aunt and paternal grandmother; Breast cancer in an other family member; Colon polyps in her mother; Diabetes in her sister; Diabetes type II in her mother; Other in her mother; Stroke in her brother. There is no history of Colon cancer.  ROS:   Please see the history of present illness.    All other systems reviewed and are negative.  EKGs/Labs/Other Studies Reviewed:    The following studies were reviewed today: Notes from December 2022 rheumatology visit at [facility] Normal carotid duplex ultrasound October 2021 at [facility]  EKG:  EKG is ordered today.  Shows normal sinus rhythm, normal tracing, QTc 463 ms Recent Labs: 03/07/2022: ALT 9; BUN 15; Creatinine 0.82; Hemoglobin 13.3; Platelet Count 282; Potassium 3.0; Sodium 139  Recent Lipid Panel    Component Value Date/Time   CHOL 163 12/20/2017 0550   TRIG 56 12/20/2017 0550   HDL 47 12/20/2017 0550   CHOLHDL 3.5 12/20/2017 0550   VLDL 11 12/20/2017 0550   LDLCALC 105 (H) 12/20/2017 0550    Physical Exam:    VS:  BP 136/82 (BP Location: Left Arm, Patient Position: Sitting, Cuff Size: Large)   Pulse 68   Ht _0  (1.676 m)   Wt 267 lb 9.6 oz (121.4 kg)   SpO2 98%   BMI 43.19 kg/m      Wt Readings from Last 3 Encounters:  05/09/22 267 lb 9.6 oz (121.4 kg)  03/07/22 264 lb 4.8 oz (119.9 kg)  05/17/21 294 lb 12.8 oz (133.7 kg)     General: Alert, oriented x3, no distress, morbidly obese Head: no evidence of trauma, PERRL, EOMI, no exophtalmos or lid lag, no myxedema, no xanthelasma; normal ears, nose and oropharynx Neck: normal jugular venous pulsations and no hepatojugular reflux; brisk carotid pulses without delay and no carotid bruits Chest: clear to auscultation, no signs of consolidation by percussion or palpation, normal fremitus, symmetrical and full respiratory excursions Cardiovascular: normal position and quality of the apical impulse, regular rhythm, normal first and second heart sounds, no murmurs, rubs or gallops Abdomen: no tenderness or distention, no masses by palpation, no abnormal pulsatility or arterial bruits, normal bowel sounds, no hepatosplenomegaly Extremities: no clubbing, cyanosis or edema; 2+ radial, ulnar and brachial pulses bilaterally; 2+ right femoral, posterior tibial and dorsalis pedis pulses; 2+ left femoral, posterior tibial and dorsalis pedis pulses; no subclavian or femoral bruits Neurological: grossly nonfocal Psych: Normal mood and affect    ASSESSMENT:    1. Precordial pain   2. Essential hypertension   3. Dermatomyositis ([place])   4. Morbid obesity ([place])      PLAN:    In order of problems listed above:  Chest pain: Currently asymptomatic.  Previous episodes may have represented autoimmune pleuritis or pericarditis, but she is also had symptoms suggestive of costochondritis.  Extensive previous coronary work-up was negative.  HTN: Well-controlled.  Occasionally blood pressure increases during treatment with steroids or NSAIDs. Dermatomyositis: Currently primarily with cutaneous symptoms.  Rheumatologist is Dr. Aldona Bar at [facility].  Her rheumatological disorder appears to be connected to her breast cancer diagnosis  improved following surgery, but subsequently recurred.  He has been keeping up-to-date with mammograms.  No evidence of cancer has been identified Morbid obesity: Done an excellent job with weight loss, losing about 60 pounds.  She remains in morbidly obese range but continues to work on healthy diet and hopefully will be able to exercise more as her musculoskeletal  symptoms improved.  Medication Adjustments/Labs and Tests Ordered: Current medicines are reviewed at length with the patient today.  Concerns regarding medicines are outlined above.  No orders of the defined types were placed in this encounter.  No orders of the defined types were placed in this encounter.   Patient Instructions  Medication Instructions:  Your physician recommends that you continue on your current medications as directed. Please refer to the Current Medication list given to you today.  *If you need a refill on your cardiac medications before your next appointment, please call your pharmacy*   Follow-Up: At [facility], you and your health needs are our priority.  As part of our continuing mission to provide you with exceptional heart care, we have created designated Provider Care Teams.  These Care Teams include your primary Cardiologist (physician) and Advanced Practice Providers (APPs -  Physician Assistants and Nurse Practitioners) who all work together to provide you with the care you need, when you need it.  We recommend signing up for the patient portal called "MyChart".  Sign up information is provided on this After Visit Summary.  MyChart is used to connect with patients for Virtual Visits (Telemedicine).  Patients are able to view lab/test results, encounter notes, upcoming appointments, etc.  Non-urgent messages can be sent to your provider as well.   To learn more about what you can do with MyChart, go to [email or web address].    Your next appointment:   12 month(s)  The format for your  next appointment:   In Person  Provider:   Sanda Klein, MD      Signed, Sanda Klein, MD  05/11/2022 4:48 PM    [place]

## 2022-05-09 NOTE — Patient Instructions (Signed)
Medication Instructions:  Your physician recommends that you continue on your current medications as directed. Please refer to the Current Medication list given to you today.  *If you need a refill on your cardiac medications before your next appointment, please call your pharmacy*   Follow-Up: At [facility], you and your health needs are our priority.  As part of our continuing mission to provide you with exceptional heart care, we have created designated Provider Care Teams.  These Care Teams include your primary Cardiologist (physician) and Advanced Practice Providers (APPs -  Physician Assistants and Nurse Practitioners) who all work together to provide you with the care you need, when you need it.  We recommend signing up for the patient portal called "MyChart".  Sign up information is provided on this After Visit Summary.  MyChart is used to connect with patients for Virtual Visits (Telemedicine).  Patients are able to view lab/test results, encounter notes, upcoming appointments, etc.  Non-urgent messages can be sent to your provider as well.   To learn more about what you can do with MyChart, go to [email or web address].    Your next appointment:   12 month(s)  The format for your next appointment:   In Person  Provider:   Mihai Croitoru, MD 

## 2022-05-30 ENCOUNTER — Ambulatory Visit: Payer: Medicare Other

## 2022-06-05 ENCOUNTER — Other Ambulatory Visit: Payer: Self-pay | Admitting: Cardiovascular Disease

## 2022-07-12 ENCOUNTER — Ambulatory Visit
Admission: RE | Admit: 2022-07-12 | Discharge: 2022-07-12 | Disposition: A | Discharge status: Home / Self Care | Payer: Medicare Other | Source: Ambulatory Visit | Attending: Hematology and Oncology | Admitting: Hematology and Oncology

## 2022-08-17 ENCOUNTER — Telehealth: Payer: Self-pay | Admitting: Cardiovascular Disease

## 2022-08-17 MED ORDER — LISINOPRIL 40 MG PO TABS: 40.0000 mg | ORAL_TABLET | Freq: Every day | ORAL | 3 refills | Status: DC

## 2022-08-17 NOTE — Telephone Encounter (Signed)
*  STAT* If patient is at the pharmacy, call can be transferred to refill team.   1. Which medications need to be refilled? (please list name of each medication and dose if known) lisinopril (ZESTRIL) 40 MG tablet   2. Which pharmacy/location (including street and city if local pharmacy) is medication to be sent to? [id number] - , [place] - [address]   3. Do they need a 30 day or 90 day supply? 90 day

## 2022-08-17 NOTE — Telephone Encounter (Signed)
Pt's medication was sent to pt's pharmacy as requested. Confirmation received.  °

## 2022-09-22 ENCOUNTER — Other Ambulatory Visit: Payer: Self-pay | Admitting: Cardiovascular Disease

## 2022-10-26 ENCOUNTER — Ambulatory Visit
Admission: EM | Admit: 2022-10-26 | Discharge: 2022-10-26 | Disposition: A | Discharge status: Home / Self Care | Payer: Medicare Other | Attending: Nurse Practitioner | Admitting: Nurse Practitioner

## 2022-10-26 ENCOUNTER — Encounter: Payer: Self-pay | Admitting: Emergency Medicine

## 2022-10-26 ENCOUNTER — Other Ambulatory Visit: Payer: Self-pay

## 2022-10-26 DIAGNOSIS — N3001 Acute cystitis with hematuria: Secondary | ICD-10-CM | POA: Diagnosis present

## 2022-10-26 LAB — POCT URINALYSIS DIP (MANUAL ENTRY)
Bilirubin, UA: NEGATIVE
Glucose, UA: NEGATIVE mg/dL
Ketones, POC UA: NEGATIVE mg/dL
Nitrite, UA: NEGATIVE
Protein Ur, POC: 30 mg/dL — AB
Spec Grav, UA: 1.015 (ref 1.010–1.025)
Urobilinogen, UA: 0.2 E.U./dL
pH, UA: 5.5 (ref 5.0–8.0)

## 2022-10-26 MED ORDER — PHENAZOPYRIDINE HCL 100 MG PO TABS: 100.0000 mg | ORAL_TABLET | Freq: Three times a day (TID) | ORAL | 0 refills | Status: DC | PRN

## 2022-10-26 MED ORDER — CEPHALEXIN 500 MG PO CAPS: 500.0000 mg | ORAL_CAPSULE | Freq: Two times a day (BID) | ORAL | 0 refills | Status: AC

## 2022-10-26 NOTE — Discharge Instructions (Signed)
I suspect you have a UTI.  Take the Keflex as prescribed to treat it.  You can use the pyridium every 8 hours as need to help with pain.  Continue drinking plenty of water.   If you have develop high fevers, nausea/vomiting and unable to keep fluids down, or severe back pain in your back, go to the ER.

## 2022-10-26 NOTE — ED Provider Notes (Signed)
[facility]    CSN: [id number] Arrival date & time: 10/26/22  8295      History   Chief Complaint Chief Complaint  Patient presents with   Dysuria    HPI Gail Schmitt is a 62 y.o. female.   Patient presents today for 4-day history of dysuria, urinary frequency and urgency, and foul urinary odor.  Reports initially, she took Pyridium which seemed to help with her symptoms, then she ran out.  She then took over-the-counter Azo which did not help much.  She denies new urinary or bowel incontinence, hematuria, abdominal pain, new back pain or flank pain, fever, bodyaches or chills, nausea/vomiting, and vaginal discharge.  She is all also been trying to drink more water for her symptoms.  Patient reports history of UTI years ago.  She also reports history of kidney infection years ago.  Denies antibiotic use in the past 90 days.     Past Medical History:  Diagnosis Date   Arthritis    "left hip; hands" (07/28/2014)   Breast cancer, left breast (HCC) 1999   a. s/p left mastectomy   Dermatomyositis (HCC)    a. 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HCC)    Family history of adverse reaction to anesthesia    "all my daughters get real sick and throw up alot"   Family history of brain cancer    Family history of breast cancer    Family history of stomach cancer    Fatty liver    Headache    "maybe monthly" (07/28/2014)   Heart murmur    Hypertension    Mitral valve prolapse    Normal coronary arteries    last cath 2016   Obesity    Pneumonia    "haven't had it in the last 4 yrs; before that I had it q yr for 4-5 years" (07/28/2014)   Pulmonary embolism (HCC) ~ 2003   RBBB    with symptomatic bradycardia   Tubular adenoma of colon    Unstable angina (HCC)    a. Cath ~ 10 + yrs ago, [facility], [place] - "normal but pressures were high."   Uterine cancer (HCC) 2007    Patient Active  Problem List   Diagnosis Date Noted   Malignant neoplasm of overlapping sites of left breast in female, estrogen receptor negative (HCC) 07/30/2019   Genetic testing 02/20/2019   History of uterine cancer 02/05/2019   Family history of brain cancer    Family history of stomach cancer    Family history of breast cancer    False positive stress test 04/19/2018   Beta-blockers contraindicated due to bradycardia 01/09/2018   Normal coronary arteries 01/09/2018   History of breast cancer 01/09/2018   History of pulmonary embolus (PE) 01/09/2018   Dermatomyositis (HCC)    Symptomatic cholelithiasis 07/06/2014   Vitamin D deficiency 06/23/2014   Fatty infiltration of liver 06/23/2014   Obesity    Chest pain 07/12/2011   Essential hypertension 07/12/2011    Past Surgical History:  Procedure Laterality Date   BREAST BIOPSY Left 1999   CARDIAC CATHETERIZATION  07/13/2011   CHOLECYSTECTOMY N/A 07/31/2014   Procedure: LAPAROSCOPIC CHOLECYSTECTOMY WITH INTRAOPERATIVE CHOLANGIOGRAM;  Surgeon: Harriette Bouillon, MD;  Location: [facility] OR;  Service: General;  Laterality: N/A;   INGUINAL HERNIA REPAIR Left 1986   LAPAROSCOPIC TOTAL HYSTERECTOMY     LEFT HEART CATHETERIZATION WITH CORONARY ANGIOGRAM N/A  07/13/2011   Procedure: LEFT HEART CATHETERIZATION WITH CORONARY ANGIOGRAM;  Surgeon: Wendall Stade, MD;  Location: [facility] CATH LAB;  Service: Cardiovascular;  Laterality: N/A;   LEFT HEART CATHETERIZATION WITH CORONARY ANGIOGRAM N/A 07/30/2014   Procedure: LEFT HEART CATHETERIZATION WITH CORONARY ANGIOGRAM;  Surgeon: Corky Crafts, MD;  Location: [facility] CATH LAB;  Service: Cardiovascular;  Laterality: N/A;   MASTECTOMY Left 1999   RECONSTRUCTION BREAST IMMEDIATE / DELAYED W/ TISSUE EXPANDER Left 2005   TOTAL ABDOMINAL HYSTERECTOMY  2007   TUBAL LIGATION  1990    OB History   No obstetric history on file.      Home Medications    Prior to Admission medications   Medication Sig Start Date End Date  Taking? Authorizing Provider  cephALEXin (KEFLEX) 500 MG capsule Take 1 capsule (500 mg total) by mouth 2 (two) times daily for 5 days. 10/26/22 10/31/22 Yes Valentino Nose, NP  Oxcarbazepine (TRILEPTAL) 300 MG tablet Take 300 mg by mouth daily.   Yes [provider]  phenazopyridine (PYRIDIUM) 100 MG tablet Take 1 tablet (100 mg total) by mouth 3 (three) times daily as needed for pain. 10/26/22  Yes Valentino Nose, NP  albuterol (VENTOLIN HFA) 108 (90 Base) MCG/ACT inhaler Inhale 2 puffs into the lungs every 4 (four) hours as needed for wheezing or shortness of breath. Patient not taking: Reported on 05/09/2022 02/09/22   Particia Nearing, PA-C  amLODipine (NORVASC) 2.5 MG tablet Take 1 tablet by mouth once daily 09/24/22   Croitoru, Mihai, MD  benzonatate (TESSALON) 100 MG capsule Take 1 capsule (100 mg total) by mouth every 8 (eight) hours. Patient not taking: Reported on 05/09/2022 01/20/21   Wurst, [place], PA-C  budesonide-formoterol [facility]) 160-4.5 MCG/ACT inhaler Inhale 2 puffs into the lungs 2 (two) times daily as needed. Patient not taking: Reported on 05/09/2022    [provider]  cyclobenzaprine (FLEXERIL) 10 MG tablet Take 10 mg by mouth every 8 (eight) hours. Patient not taking: Reported on 05/09/2022 06/04/18   [provider]  folic acid (FOLVITE) 1 MG tablet Take 1 mg by mouth daily.    [provider]  hydrochlorothiazide (HYDRODIURIL) 25 MG tablet Take 1 tablet by mouth once daily 06/06/22   Croitoru, Mihai, MD  latanoprost (XALATAN) 0.005 % ophthalmic solution INSTILL 1 DROP INTO BOTH EYES EVERY DAY AT BEDTIME 01/05/19   [provider]  lidocaine (LIDODERM) 5 % Place 1 patch onto the skin daily. Remove & Discard patch within 12 hours or as directed by MD 12/08/18   Nicanor Alcon, April, MD  lisinopril (ZESTRIL) 40 MG tablet Take 1 tablet (40 mg total) by mouth daily. 08/17/22   Croitoru, Mihai, MD  loperamide (IMODIUM) 2 MG capsule Take  2 mg by mouth as needed for diarrhea or loose stools.    [provider]  loratadine (CLARITIN) 10 MG tablet Take 10 mg by mouth daily.    [provider]  LORazepam (ATIVAN) 1 MG tablet Take 1 hour prior to MRI and may repeat if needed. Patient not taking: Reported on 05/09/2022 06/22/19   Magrinat, Valentino Hue, MD  methotrexate (RHEUMATREX) 2.5 MG tablet Take 2.5 mg by mouth once a week. & tablets 10/26/21 10/26/22  [provider]  Multiple Vitamin (MULTIVITAMIN WITH MINERALS) TABS tablet Take 1 tablet by mouth daily.    [provider]  nitroGLYCERIN (NITROSTAT) 0.4 MG SL tablet Place 1 tablet (0.4 mg total) under the tongue every 5 (five) minutes as  needed for chest pain. Patient not taking: Reported on 05/09/2022 12/21/17 05/17/21  Janetta Hora, PA-C  omeprazole (PRILOSEC) 20 MG capsule Take 1 capsule (20 mg total) by mouth daily. 12/08/18   Palumbo, April, MD  predniSONE (DELTASONE) 20 MG tablet Take 2 tablets (40 mg total) by mouth daily with breakfast. Patient not taking: Reported on 05/09/2022 02/09/22   Particia Nearing, PA-C  triamcinolone cream (KENALOG) 0.1 % APPLY TO AFFECTED AREAS TWO TIMES DAILY 01/28/19   [provider]    Family History Family History  Problem Relation Age of Onset   Diabetes type II Mother    Colon polyps Mother    Other Mother        cholangiocarcinoma   Diabetes Sister    Stroke Brother    Brain cancer Paternal Aunt    Brain cancer Paternal Grandmother    Breast cancer Other    Colon cancer Neg Hx     Social History Social History   Tobacco Use   Smoking status: Never   Smokeless tobacco: Never  Vaping Use   Vaping Use: Never used  Substance Use Topics   Alcohol use: Not Currently    Alcohol/week: 1.0 standard drink of alcohol    Types: 1 Standard drinks or equivalent per week    Comment: 07/28/2014 "might have 1 drink a couple times/month"   Drug use: No     Allergies   Meloxicam and  Percocet [oxycodone-acetaminophen]   Review of Systems Review of Systems Per HPI  Physical Exam Triage Vital Signs ED Triage Vitals [10/26/22 1123]  Enc Vitals Group     BP 130/83     Pulse Rate 70     Resp 20     Temp 98.1 F (36.7 C)     Temp Source Oral     SpO2 99 %     Weight      Height      Head Circumference      Peak Flow      Pain Score 0     Pain Loc      Pain Edu?      Excl. in GC?    No data found.  Updated Vital Signs BP 130/83 (BP Location: Right Arm)   Pulse 70   Temp 98.1 F (36.7 C) (Oral)   Resp 20   SpO2 99%   Visual Acuity Right Eye Distance:   Left Eye Distance:   Bilateral Distance:    Right Eye Near:   Left Eye Near:    Bilateral Near:     Physical Exam Vitals and nursing note reviewed.  Constitutional:      General: She is not in acute distress.    Appearance: She is not toxic-appearing.  Pulmonary:     Effort: Pulmonary effort is normal. No respiratory distress.  Abdominal:     General: Abdomen is flat. Bowel sounds are normal. There is no distension.     Palpations: Abdomen is soft. There is no mass.     Tenderness: There is no abdominal tenderness. There is no right CVA tenderness, left CVA tenderness or guarding.  Skin:    General: Skin is warm and dry.     Coloration: Skin is not jaundiced or pale.     Findings: No erythema.  Neurological:     Mental Status: She is alert and oriented to person, place, and time.     Motor: No weakness.     Gait: Gait normal.  Psychiatric:        Behavior: Behavior is cooperative.      UC Treatments / Results  Labs (all labs ordered are listed, but only abnormal results are displayed) Labs Reviewed  POCT URINALYSIS DIP (MANUAL ENTRY) - Abnormal; Notable for the following components:      Result Value   Clarity, UA cloudy (*)    Blood, UA moderate (*)    Protein Ur, POC =30 (*)    Leukocytes, UA Moderate (2+) (*)    All other components within normal limits  URINE CULTURE     EKG   Radiology No results found.  Procedures Procedures (including critical care time)  Medications Ordered in UC Medications - No data to display  Initial Impression / Assessment and Plan / UC Course  I have reviewed the triage vital signs and the nursing notes.  Pertinent labs & imaging results that were available during my care of the patient were reviewed by me and considered in my medical decision making (see chart for details).   Patient is well-appearing, normotensive, afebrile, not tachycardic, not tachypneic, oxygenating well on room air.    1. Acute cystitis with hematuria Urinalysis today is cloudy with moderate blood and 2+ leukocyte Estrace Urine culture is pending I am suspicious for UTI based on symptoms No recent urine culture; will treat with Keflex 500 mg twice daily for 5 days while culture is pending Renal dosing not indicated-last GFR approximately 6 months ago greater than 60 Can use Pyridium to help with bladder pain, other supportive care discussed Strict ER precautions discussed with patient  The patient was given the opportunity to ask questions.  All questions answered to their satisfaction.  The patient is in agreement to this plan.    Final Clinical Impressions(s) / UC Diagnoses   Final diagnoses:  Acute cystitis with hematuria     Discharge Instructions      I suspect you have a UTI.  Take the Keflex as prescribed to treat it.  You can use the pyridium every 8 hours as need to help with pain.  Continue drinking plenty of water.   If you have develop high fevers, nausea/vomiting and unable to keep fluids down, or severe back pain in your back, go to the ER.     ED Prescriptions     Medication Sig Dispense Auth. Provider   cephALEXin (KEFLEX) 500 MG capsule Take 1 capsule (500 mg total) by mouth 2 (two) times daily for 5 days. 10 capsule Cathlean Marseilles A, NP   phenazopyridine (PYRIDIUM) 100 MG tablet Take 1 tablet (100 mg total)  by mouth 3 (three) times daily as needed for pain. 10 tablet Valentino Nose, NP      PDMP not reviewed this encounter.   Valentino Nose, NP 10/26/22 1153

## 2022-10-26 NOTE — ED Triage Notes (Signed)
Pt reports dysuria that has progressively gotten worse x4 days.

## 2022-10-28 LAB — URINE CULTURE: Culture: 20000 — AB

## 2023-01-30 ENCOUNTER — Other Ambulatory Visit: Payer: Self-pay | Admitting: Cardiovascular Disease

## 2023-03-28 ENCOUNTER — Ambulatory Visit (HOSPITAL_COMMUNITY): Payer: Medicare Other

## 2023-03-28 ENCOUNTER — Encounter: Payer: Self-pay | Admitting: Internal Medicine

## 2023-04-25 ENCOUNTER — Other Ambulatory Visit: Payer: Self-pay | Admitting: Cardiovascular Disease

## 2023-05-22 ENCOUNTER — Other Ambulatory Visit: Payer: Self-pay | Admitting: Cardiovascular Disease

## 2023-07-17 ENCOUNTER — Ambulatory Visit: Payer: Medicare Other | Admitting: Cardiovascular Disease

## 2023-07-29 ENCOUNTER — Other Ambulatory Visit: Payer: Self-pay | Admitting: Cardiovascular Disease

## 2023-08-12 ENCOUNTER — Other Ambulatory Visit: Payer: Self-pay | Admitting: Family Medicine

## 2023-08-12 DIAGNOSIS — Z1231 Encounter for screening mammogram for malignant neoplasm of breast: Secondary | ICD-10-CM

## 2023-08-21 ENCOUNTER — Other Ambulatory Visit: Payer: Self-pay | Admitting: Cardiovascular Disease

## 2023-08-28 ENCOUNTER — Encounter (HOSPITAL_COMMUNITY): Payer: Self-pay

## 2023-08-28 ENCOUNTER — Emergency Department (HOSPITAL_COMMUNITY)

## 2023-08-28 ENCOUNTER — Other Ambulatory Visit: Payer: Self-pay

## 2023-08-28 ENCOUNTER — Emergency Department (HOSPITAL_COMMUNITY)
Admission: EM | Admit: 2023-08-28 | Discharge: 2023-08-28 | Disposition: A | Discharge status: Home / Self Care | Attending: Emergency Medicine | Admitting: Emergency Medicine

## 2023-08-28 DIAGNOSIS — E876 Hypokalemia: Secondary | ICD-10-CM | POA: Insufficient documentation

## 2023-08-28 DIAGNOSIS — R600 Localized edema: Secondary | ICD-10-CM | POA: Insufficient documentation

## 2023-08-28 DIAGNOSIS — Z79899 Other long term (current) drug therapy: Secondary | ICD-10-CM | POA: Diagnosis not present

## 2023-08-28 DIAGNOSIS — I10 Essential (primary) hypertension: Secondary | ICD-10-CM | POA: Insufficient documentation

## 2023-08-28 DIAGNOSIS — M79662 Pain in left lower leg: Secondary | ICD-10-CM | POA: Insufficient documentation

## 2023-08-28 DIAGNOSIS — M7989 Other specified soft tissue disorders: Secondary | ICD-10-CM | POA: Diagnosis present

## 2023-08-28 LAB — BASIC METABOLIC PANEL WITH GFR
Anion gap: 13 (ref 5–15)
BUN: 16 mg/dL (ref 8–23)
CO2: 26 mmol/L (ref 22–32)
Calcium: 9.5 mg/dL (ref 8.9–10.3)
Chloride: 99 mmol/L (ref 98–111)
Creatinine, Ser: 0.88 mg/dL (ref 0.44–1.00)
GFR, Estimated: >60 mL/min (ref >60)
Glucose, Bld: 87 mg/dL (ref 70–99)
Potassium: 3 mmol/L — ABNORMAL LOW (ref 3.5–5.1)
Sodium: 138 mmol/L (ref 135–145)

## 2023-08-28 LAB — CBC WITH DIFFERENTIAL/PLATELET
Abs Immature Granulocytes: 0.02 10*3/uL (ref 0.00–0.07)
Basophils Absolute: 0.1 10*3/uL (ref 0.0–0.1)
Basophils Relative: 1 %
Eosinophils Absolute: 0.1 10*3/uL (ref 0.0–0.5)
Eosinophils Relative: 2 %
HCT: 39.6 % (ref 36.0–46.0)
Hemoglobin: 12.4 g/dL (ref 12.0–15.0)
Immature Granulocytes: 0 %
Lymphocytes Relative: 36 %
Lymphs Abs: 2.1 10*3/uL (ref 0.7–4.0)
MCH: 29.4 pg (ref 26.0–34.0)
MCHC: 31.3 g/dL (ref 30.0–36.0)
MCV: 93.8 fL (ref 80.0–100.0)
Monocytes Absolute: 0.5 10*3/uL (ref 0.1–1.0)
Monocytes Relative: 8 %
Neutro Abs: 3.2 10*3/uL (ref 1.7–7.7)
Neutrophils Relative %: 53 %
Platelets: 258 10*3/uL (ref 150–400)
RBC: 4.22 MIL/uL (ref 3.87–5.11)
RDW: 14.4 % (ref 11.5–15.5)
WBC: 6 10*3/uL (ref 4.0–10.5)
nRBC: 0 % (ref 0.0–0.2)

## 2023-08-28 MED ORDER — POTASSIUM CHLORIDE CRYS ER 20 MEQ PO TBCR: 20.0000 meq | EXTENDED_RELEASE_TABLET | Freq: Two times a day (BID) | ORAL | 0 refills | Status: DC

## 2023-08-28 MED ORDER — ACETAMINOPHEN 325 MG PO TABS
650.0000 mg | ORAL_TABLET | Freq: Once | ORAL | Status: AC
  Administered 2023-08-28: 650 mg via ORAL
  Filled 2023-08-28: qty 2

## 2023-08-28 MED ORDER — PREDNISONE 5 MG PO TABS: ORAL_TABLET | ORAL | 0 refills | Status: DC

## 2023-08-28 NOTE — ED Triage Notes (Signed)
 Pt arrived via POV c/o rheumatoid arthritis flare up in left knee/leg. Pt reports taking methotrexate and prednisolone  as prescribed. Pt reports swelling was worse on Monday after Pt reports she was walking around [place] an [employer] [place] this past weekend. Pt reports pain radiates to inferior aspect of her foot when bearing weight. Pt denies injury and reports this pain usually flares-up each year.

## 2023-08-28 NOTE — ED Provider Triage Note (Signed)
 Emergency Medicine Provider Triage Evaluation Note  Gail Schmitt , a 63 y.o. female  was evaluated in triage.  Pt complains of pain swelling left lower leg.  On Sunday noticed swelling of her leg.  States that she has RA and dermatomyositis.  Has occasional flares of swelling/pain.  She typically takes prednisone  when she has an episode.  She took one 5 mg prednisone  on Sunday without relief.  Woke Monday morning and swelling was worse.  Took a total of 15 mg of prednisone  with some improvement in her pain and swelling.  Took another 15 mg yesterday but continues to have swelling of the left leg compared to the right.  Pain worse with weightbearing.  She contacted her rheumatologist and was advised to come to the ER for further evaluation of blood clot.  No history of DVT does not currently take blood thinners. No rash  Review of Systems  Positive: Left lower leg pain and swelling Negative: Fever, chills, redness of the extremity, recent wound, numbness of her leg, chest pain or shortness of breath  Physical Exam  BP 134/85 (BP Location: Right Arm)   Pulse 78   Temp 98 F (36.7 C) (Oral)   Resp (!) 24   Ht 5\' 6"  (1.676 m)   Wt 121.4 kg   SpO2 99%   BMI 43.20 kg/m  Gen:   Awake, no distress   Resp:  Normal effort  MSK:   Moves extremities without difficulty, mild swelling left lower leg Other:    Medical Decision Making  Medically screening exam initiated at 1:35 PM.  Appropriate orders placed.  TULSI CROSSETT was informed that the remainder of the evaluation will be completed by another provider, this initial triage assessment does not replace that evaluation, and the importance of remaining in the ED until their evaluation is complete.     Catherne Clubs, PA-C 08/28/23 1347

## 2023-08-28 NOTE — ED Provider Notes (Signed)
 [place] EMERGENCY DEPARTMENT AT [facility] Provider Note   CSN: [id number] Arrival date & time: 08/28/23  1216     History  Chief Complaint  Patient presents with   Rheumatoid Arthritis    Gail Schmitt is a 63 y.o. female.  HPI      Gail Schmitt is a 63 y.o. female past medical history of hypertension, dermatomyositis, rheumatoid arthritis, who presents to the Emergency Department complaining of pain swelling over the left lower leg x 3 days.  Describes aching pain of the lower leg to the level of the proximal ankle.  States she has recurrent flares from her dermatomyositis and typically takes prednisone  when she has a flare.  Took one 5 mg prednisone  tablet on Sunday without improvement.  She took total of 15 mg on Monday with some improvement of her swelling took an additional 15 mg yesterday.  Here today with swelling of the lower leg that is improving from onset.  Concerned about possible blood clot if she is had PE in the past.  Not currently on blood thinners.  She denies any known injury numbness or weakness of her leg.  No rash fever or chills.  Also having pain to her left foot into her toes of the left foot.  No discoloration or open wounds of the foot    Home Medications Prior to Admission medications   Medication Sig Start Date End Date Taking? Authorizing Provider  methotrexate (RHEUMATREX) 2.5 MG tablet Take 15 mg by mouth once a week. 05/20/23 05/19/24 Yes [provider]  predniSONE  (DELTASONE ) 5 MG tablet Take 5 mg by mouth daily with breakfast. 05/20/23  Yes [provider]  albuterol  (VENTOLIN  HFA) 108 (90 Base) MCG/ACT inhaler Inhale 2 puffs into the lungs every 4 (four) hours as needed for wheezing or shortness of breath. Patient not taking: Reported on 05/09/2022 02/09/22   Corbin Dess, PA-C  amLODipine  (NORVASC ) 2.5 MG tablet Take 1 tablet by mouth once daily 07/31/23   Croitoru, Karyl Paget, MD  benzonatate  (TESSALON ) 100  MG capsule Take 1 capsule (100 mg total) by mouth every 8 (eight) hours. Patient not taking: Reported on 05/09/2022 01/20/21   Wurst, [place], PA-C  budesonide-formoterol (SYMBICORT) 160-4.5 MCG/ACT inhaler Inhale 2 puffs into the lungs 2 (two) times daily as needed. Patient not taking: Reported on 05/09/2022    [provider]  cyclobenzaprine  (FLEXERIL ) 10 MG tablet Take 10 mg by mouth every 8 (eight) hours. Patient not taking: Reported on 05/09/2022 06/04/18   [provider]  folic acid (FOLVITE) 1 MG tablet Take 1 mg by mouth daily.    [provider]  hydrochlorothiazide  (HYDRODIURIL ) 25 MG tablet Take 1 tablet by mouth once daily 08/21/23   Croitoru, Mihai, MD  latanoprost (XALATAN) 0.005 % ophthalmic solution INSTILL 1 DROP INTO BOTH EYES EVERY DAY AT BEDTIME 01/05/19   [provider]  lidocaine  (LIDODERM ) 5 % Place 1 patch onto the skin daily. Remove & Discard patch within 12 hours or as directed by MD 12/08/18   Maralee Senate, April, MD  lisinopril  (ZESTRIL ) 40 MG tablet Take 1 tablet (40 mg total) by mouth daily. 08/17/22   Croitoru, Mihai, MD  loperamide (IMODIUM) 2 MG capsule Take 2 mg by mouth as needed for diarrhea or loose stools.    [provider]  loratadine (CLARITIN) 10 MG tablet Take 10 mg by mouth daily.    [provider]  LORazepam  (ATIVAN ) 1 MG tablet Take 1 hour  prior to MRI and may repeat if needed. Patient not taking: Reported on 05/09/2022 06/22/19   Magrinat, Gustav C, MD  Multiple Vitamin (MULTIVITAMIN WITH MINERALS) TABS tablet Take 1 tablet by mouth daily.    [provider]  nitroGLYCERIN  (NITROSTAT ) 0.4 MG SL tablet Place 1 tablet (0.4 mg total) under the tongue every 5 (five) minutes as needed for chest pain. Patient not taking: Reported on 05/09/2022 12/21/17 05/17/21  Ardia Kraft, PA-C  omeprazole  (PRILOSEC) 20 MG capsule Take 1 capsule (20 mg total) by mouth daily. 12/08/18   Palumbo, April, MD  Oxcarbazepine  (TRILEPTAL) 300 MG tablet Take 300 mg by mouth daily.    [provider]  phenazopyridine  (PYRIDIUM ) 100 MG tablet Take 1 tablet (100 mg total) by mouth 3 (three) times daily as needed for pain. 10/26/22   Wilhemena Harbour, NP  predniSONE  (DELTASONE ) 20 MG tablet Take 2 tablets (40 mg total) by mouth daily with breakfast. Patient not taking: Reported on 05/09/2022 02/09/22   Corbin Dess, PA-C  triamcinolone cream (KENALOG) 0.1 % APPLY TO AFFECTED AREAS TWO TIMES DAILY 01/28/19   [provider]      Allergies    Meloxicam  and Percocet [oxycodone-acetaminophen ]    Review of Systems   Review of Systems  Constitutional:  Negative for chills and fever.  Respiratory:  Negative for shortness of breath.   Cardiovascular:  Negative for chest pain.  Gastrointestinal:  Negative for nausea and vomiting.  Musculoskeletal:  Positive for myalgias (Pain left lower leg, swelling).  Skin:  Negative for color change, rash and wound.  Neurological:  Negative for weakness and numbness.    Physical Exam Updated Vital Signs BP 134/85 (BP Location: Right Arm)   Pulse 78   Temp 98 F (36.7 C) (Oral)   Resp (!) 24   Ht 5\' 6"  (1.676 m)   Wt 121.4 kg   SpO2 99%   BMI 43.20 kg/m  Physical Exam Vitals and nursing note reviewed.  Constitutional:      General: She is not in acute distress.    Appearance: Normal appearance. She is not ill-appearing or toxic-appearing.  Cardiovascular:     Rate and Rhythm: Normal rate and regular rhythm.     Pulses: Normal pulses.     Comments: Strong dorsalis pedis and posterior tibial pulses bilaterally. Pulmonary:     Effort: Pulmonary effort is normal.  Musculoskeletal:        General: Swelling and tenderness present. No signs of injury.     Right lower leg: No edema.     Left lower leg: Tenderness present.     Comments: Mild edema noted of left lower extremity compared to right.  Tenderness along the posterior left lower leg, negative  Homans' sign.  No erythema or excessive warmth of the extremity.  Toes of left foot are warm and pink, good cap refill.  No skin changes or open wounds or erythema of the left foot or toes.  Skin:    General: Skin is warm.     Capillary Refill: Capillary refill takes less than 2 seconds.     Findings: No bruising or erythema.  Neurological:     General: No focal deficit present.     Mental Status: She is alert.     Sensory: No sensory deficit.     Motor: No weakness.     ED Results / Procedures / Treatments   Labs (all labs ordered are listed, but only abnormal results are  displayed) Labs Reviewed  BASIC METABOLIC PANEL WITH GFR - Abnormal; Notable for the following components:      Result Value   Potassium 3.0 (*)    All other components within normal limits  CBC WITH DIFFERENTIAL/PLATELET    EKG None  Radiology US  Venous Img Lower Unilateral Left Result Date: 08/28/2023 CLINICAL DATA:  Pain and swelling left lower extremity for 4 days EXAM: Left LOWER EXTREMITY VENOUS DOPPLER ULTRASOUND TECHNIQUE: Gray-scale sonography with compression, as well as color and duplex ultrasound, were performed to evaluate the deep venous system(s) from the level of the common femoral vein through the popliteal and proximal calf veins. COMPARISON:  None Available. FINDINGS: VENOUS Normal compressibility of the common femoral, superficial femoral, and popliteal veins, as well as the visualized calf veins. Visualized portions of profunda femoral vein and great saphenous vein unremarkable. No filling defects to suggest DVT on grayscale or color Doppler imaging. Doppler waveforms show normal direction of venous flow, normal respiratory plasticity and response to augmentation. Limited views of the contralateral common femoral vein are unremarkable. OTHER None. Limitations: none IMPRESSION: No evidence of left lower extremity DVT. Electronically Signed   By: Adrianna Horde M.D.   On: 08/28/2023 14:26     Procedures Procedures    Medications Ordered in ED Medications - No data to display  ED Course/ Medical Decision Making/ A&P                                 Medical Decision Making Patient here with history of dermatomyositis.  Endorses recurrent flares.  Has taken prednisone  for 3 days but continues to have swelling and discomfort of her left lower leg.  Remote history of PE not currently on blood thinner. States her rheumatologist recommended ER evaluation to rule out blood clot  On my exam, patient well-appearing nontoxic.  There is some mild swelling of left lower extremity compared to right.  She has good cap refill and strong palpable dorsalis and posterior tibial pulse.  Do not appreciate any erythema or excessive warmth of the foot toes or lower leg.  Likely flare of her dermatomyositis, DVT also considered, cellulitis also considered.  No findings suggestive of ischemic leg.  Amount and/or Complexity of Data Reviewed Labs: ordered.    Details: Labs overall reassuring.  She has mild hypokalemia with a potassium of 3.  She does take hydrochlorothiazide  daily which is likely cause no evidence of leukocytosis. Radiology: ordered.    Details: Venous ultrasound imaging of the left lower extremity without evidence of DVT Discussion of management or test interpretation with external provider(s): Pain swelling of lower extremity felt to be related to her dermatomyositis.  She has good follow-up with her rheumatology at [facility].  Extremity is neurovascularly intact.  Will prescribe steroid taper pack and short course of potassium.  I recommended close outpatient follow-up in 1 week with her PCP to have her potassium level rechecked and she will follow-up closely with her rheumatologist as well.  Return precautions were given           Final Clinical Impression(s) / ED Diagnoses Final diagnoses:  Pain and swelling of left lower leg  Hypokalemia    Rx / DC Orders ED  Discharge Orders     None         Catherne Clubs, PA-C 08/28/23 1625    Mordecai Applebaum, MD 08/31/23 1505

## 2023-08-28 NOTE — Discharge Instructions (Signed)
 Your ultrasound today did not show evidence of a blood clot.  You have been prescribed a prednisone  taper to take as directed.  Your potassium level today was low I have provided a prescription for potassium for you to take for the next few days.  I recommend that you have your potassium level checked by your primary care provider in 1 week.  Return to the emergency department if you develop any new or worsening symptoms

## 2023-08-28 NOTE — ED Notes (Signed)
 Patient discharged. Provider spoke to patient. Paperwork given to patient and reviewed. Pt verbalized understanding. VSS. A+Ox4. Patient ambulated out of the ER with steady, gait with walker. No iv in place.

## 2023-08-28 NOTE — ED Notes (Signed)
 Per Note in Pts Chart from [facility] Triage Nurse, Suggested to have ED fax report, imaging, and lab results to Dr. Melven Stable, [phone].

## 2023-08-30 ENCOUNTER — Other Ambulatory Visit: Payer: Self-pay | Admitting: Cardiovascular Disease

## 2023-09-02 ENCOUNTER — Ambulatory Visit (INDEPENDENT_AMBULATORY_CARE_PROVIDER_SITE_OTHER)

## 2023-09-02 ENCOUNTER — Ambulatory Visit
Admission: RE | Admit: 2023-09-02 | Discharge: 2023-09-02 | Disposition: A | Discharge status: Home / Self Care | Source: Ambulatory Visit | Attending: Family Medicine | Admitting: Family Medicine

## 2023-09-02 ENCOUNTER — Ambulatory Visit
Admission: EM | Admit: 2023-09-02 | Discharge: 2023-09-02 | Disposition: A | Discharge status: Home / Self Care | Attending: Nurse Practitioner | Admitting: Nurse Practitioner

## 2023-09-02 DIAGNOSIS — Z1231 Encounter for screening mammogram for malignant neoplasm of breast: Secondary | ICD-10-CM

## 2023-09-02 DIAGNOSIS — M79672 Pain in left foot: Secondary | ICD-10-CM | POA: Diagnosis not present

## 2023-09-02 HISTORY — DX: Personal history of antineoplastic chemotherapy: Z92.21

## 2023-09-02 MED ORDER — PREDNISONE 20 MG PO TABS: ORAL_TABLET | ORAL | 0 refills | Status: AC

## 2023-09-02 NOTE — Discharge Instructions (Addendum)
 The foot xray today does not appear to show any obvious broken bones.  I will contact you later today if the radiologist reads the x-ray differently.  In the meantime, recommend taking prednisone  as we discussed and close follow-up with podiatry if symptoms do not improve; contact information has been provided.

## 2023-09-02 NOTE — ED Provider Notes (Signed)
 [facility]    CSN: [id number] Arrival date & time: 09/02/23  0930      History   Chief Complaint Chief Complaint  Patient presents with   Foot Pain    HPI Gail Schmitt is a 63 y.o. female.   Patient presents today with left plantar third toe pain and left 2nd/3rd digit pain for the past 2 weeks.  When symptoms first began, she reports her whole foot and leg was swollen and painful.  She reports it was so sensitive to touch, the bedsheet could not touch it without causing pain.  Denies recent fall, trauma, or known injury to the area.  She thinks she may remember stepping on some glass in her house 2 days before the pain began.  She was initially seen in the ER for symptoms and ultrasound was negative for DVT.  She was treated with oral prednisone  which has helped with the symptoms but pain is still present and she finishes the prednisone  tomorrow.    She tried to pick at the area under her foot with a pin last night to get out possible foreign body without success.  Patient reports history of gout in both feet.    Past Medical History:  Diagnosis Date   Arthritis    "left hip; hands" (07/28/2014)   Breast cancer, left breast (HCC) 1999   a. s/p left mastectomy   Dermatomyositis (HCC)    a. 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HCC)    Family history of adverse reaction to anesthesia    "all my daughters get real sick and throw up alot"   Family history of brain cancer    Family history of breast cancer    Family history of stomach cancer    Fatty liver    Headache    "maybe monthly" (07/28/2014)   Heart murmur    Hypertension    Mitral valve prolapse    Normal coronary arteries    last cath 2016   Obesity    Personal history of chemotherapy    1999 breast cancer   Pneumonia    "haven't had it in the last 4 yrs; before that I had it q yr for 4-5 years" (07/28/2014)    Pulmonary embolism (HCC) ~ 2003   RBBB    with symptomatic bradycardia   Tubular adenoma of colon    Unstable angina (HCC)    a. Cath ~ 10 + yrs ago, [facility], [place] - "normal but pressures were high."   Uterine cancer (HCC) 2007    Patient Active Problem List   Diagnosis Date Noted   Malignant neoplasm of overlapping sites of left breast in female, estrogen receptor negative (HCC) 07/30/2019   Genetic testing 02/20/2019   History of uterine cancer 02/05/2019   Family history of brain cancer    Family history of stomach cancer    Family history of breast cancer    False positive stress test 04/19/2018   Beta-blockers contraindicated due to bradycardia 01/09/2018   Normal coronary arteries 01/09/2018   History of breast cancer 01/09/2018   History of pulmonary embolus (PE) 01/09/2018   Dermatomyositis (HCC)    Symptomatic cholelithiasis 07/06/2014   Vitamin D  deficiency 06/23/2014   Fatty infiltration of liver 06/23/2014   Obesity    Chest pain 07/12/2011   Essential hypertension 07/12/2011    Past Surgical History:  Procedure Laterality Date   BREAST  BIOPSY Left 1999   CARDIAC CATHETERIZATION  07/13/2011   CHOLECYSTECTOMY N/A 07/31/2014   Procedure: LAPAROSCOPIC CHOLECYSTECTOMY WITH INTRAOPERATIVE CHOLANGIOGRAM;  Surgeon: Sim Dryer, MD;  Location: [facility] OR;  Service: General;  Laterality: N/A;   INGUINAL HERNIA REPAIR Left 1986   LAPAROSCOPIC TOTAL HYSTERECTOMY     LEFT HEART CATHETERIZATION WITH CORONARY ANGIOGRAM N/A 07/13/2011   Procedure: LEFT HEART CATHETERIZATION WITH CORONARY ANGIOGRAM;  Surgeon: Loyde Rule, MD;  Location: [facility] CATH LAB;  Service: Cardiovascular;  Laterality: N/A;   LEFT HEART CATHETERIZATION WITH CORONARY ANGIOGRAM N/A 07/30/2014   Procedure: LEFT HEART CATHETERIZATION WITH CORONARY ANGIOGRAM;  Surgeon: Lucendia Rusk, MD;  Location: [facility] CATH LAB;  Service: Cardiovascular;  Laterality: N/A;   MASTECTOMY Left 1999   RECONSTRUCTION BREAST  IMMEDIATE / DELAYED W/ TISSUE EXPANDER Left 2005   REDUCTION MAMMAPLASTY Right    TOTAL ABDOMINAL HYSTERECTOMY  2007   TUBAL LIGATION  1990    OB History   No obstetric history on file.      Home Medications    Prior to Admission medications   Medication Sig Start Date End Date Taking? Authorizing Provider  amLODipine  (NORVASC ) 2.5 MG tablet Take 1 tablet by mouth once daily 07/31/23  Yes Croitoru, Mihai, MD  folic acid (FOLVITE) 1 MG tablet Take 1 mg by mouth daily.   Yes [provider]  hydrochlorothiazide  (HYDRODIURIL ) 25 MG tablet Take 1 tablet by mouth once daily 08/21/23  Yes Croitoru, Mihai, MD  latanoprost (XALATAN) 0.005 % ophthalmic solution INSTILL 1 DROP INTO BOTH EYES EVERY DAY AT BEDTIME 01/05/19  Yes [provider]  lidocaine  (LIDODERM ) 5 % Place 1 patch onto the skin daily. Remove & Discard patch within 12 hours or as directed by MD 12/08/18  Yes Palumbo, April, MD  loratadine (CLARITIN) 10 MG tablet Take 10 mg by mouth daily.   Yes [provider]  methotrexate (RHEUMATREX) 2.5 MG tablet Take 15 mg by mouth once a week. 05/20/23 05/19/24 Yes [provider]  Multiple Vitamin (MULTIVITAMIN WITH MINERALS) TABS tablet Take 1 tablet by mouth daily.   Yes [provider]  omeprazole  (PRILOSEC) 20 MG capsule Take 1 capsule (20 mg total) by mouth daily. 12/08/18  Yes Palumbo, April, MD  Oxcarbazepine (TRILEPTAL) 300 MG tablet Take 300 mg by mouth daily.   Yes [provider]  potassium chloride  SA (KLOR-CON  M) 20 MEQ tablet Take 1 tablet (20 mEq total) by mouth 2 (two) times daily. 08/28/23  Yes Triplett, Tammy, PA-C  predniSONE  (DELTASONE ) 20 MG tablet Take 2 tablets (40 mg total) by mouth daily with breakfast for 4 days, THEN 1 tablet (20 mg total) daily with breakfast for 2 days. 09/02/23 09/08/23 Yes Wilhemena Harbour, NP  predniSONE  (DELTASONE ) 5 MG tablet Take 6 tablets day one, 5 tablets day two, 4 tablets day three, 3 tablets  day four, 2 tablets day five, then 1 tablet day six 08/28/23  Yes Triplett, Tammy, PA-C  triamcinolone cream (KENALOG) 0.1 % APPLY TO AFFECTED AREAS TWO TIMES DAILY 01/28/19  Yes [provider]  albuterol  (VENTOLIN  HFA) 108 (90 Base) MCG/ACT inhaler Inhale 2 puffs into the lungs every 4 (four) hours as needed for wheezing or shortness of breath. Patient not taking: Reported on 05/09/2022 02/09/22   Corbin Dess, PA-C  benzonatate  (TESSALON ) 100 MG capsule Take 1 capsule (100 mg total) by mouth every 8 (eight) hours. Patient not taking: Reported on 05/09/2022 01/20/21   Lu Rump [place], PA-C  budesonide-formoterol (  SYMBICORT) 160-4.5 MCG/ACT inhaler Inhale 2 puffs into the lungs 2 (two) times daily as needed. Patient not taking: Reported on 05/09/2022    [provider]  cyclobenzaprine  (FLEXERIL ) 10 MG tablet Take 10 mg by mouth every 8 (eight) hours. Patient not taking: Reported on 05/09/2022 06/04/18   [provider]  lisinopril  (ZESTRIL ) 40 MG tablet Take 1 tablet (40 mg total) by mouth daily. Please call office to schedule an appt for further refills. Thank you 09/02/23   Croitoru, Karyl Paget, MD  loperamide (IMODIUM) 2 MG capsule Take 2 mg by mouth as needed for diarrhea or loose stools.    [provider]  LORazepam  (ATIVAN ) 1 MG tablet Take 1 hour prior to MRI and may repeat if needed. Patient not taking: Reported on 05/09/2022 06/22/19   Magrinat, Rozella Cornfield, MD  nitroGLYCERIN  (NITROSTAT ) 0.4 MG SL tablet Place 1 tablet (0.4 mg total) under the tongue every 5 (five) minutes as needed for chest pain. Patient not taking: Reported on 05/09/2022 12/21/17 05/17/21  Ardia Kraft, PA-C  phenazopyridine  (PYRIDIUM ) 100 MG tablet Take 1 tablet (100 mg total) by mouth 3 (three) times daily as needed for pain. 10/26/22   Wilhemena Harbour, NP    Family History Family History  Problem Relation Age of Onset   Diabetes type II Mother    Colon polyps Mother    Other Mother         cholangiocarcinoma   Diabetes Sister    Stroke Brother    Brain cancer Paternal Aunt    Brain cancer Paternal Grandmother    Breast cancer Other    Colon cancer Neg Hx     Social History Social History   Tobacco Use   Smoking status: Never   Smokeless tobacco: Never  Vaping Use   Vaping status: Never Used  Substance Use Topics   Alcohol use: Not Currently    Alcohol/week: 1.0 standard drink of alcohol    Types: 1 Standard drinks or equivalent per week    Comment: 07/28/2014 "might have 1 drink a couple times/month"   Drug use: No     Allergies   Meloxicam  and Percocet [oxycodone-acetaminophen ]   Review of Systems Review of Systems Per HPI  Physical Exam Triage Vital Signs ED Triage Vitals  Encounter Vitals Group     BP 09/02/23 1028 133/74     Systolic BP Percentile --      Diastolic BP Percentile --      Pulse Rate 09/02/23 1028 72     Resp 09/02/23 1028 16     Temp 09/02/23 1028 98.1 F (36.7 C)     Temp Source 09/02/23 1028 Oral     SpO2 09/02/23 1028 96 %     Weight --      Height --      Head Circumference --      Peak Flow --      Pain Score 09/02/23 1034 7     Pain Loc --      Pain Education --      Exclude from Growth Chart --    No data found.  Updated Vital Signs BP 133/74 (BP Location: Right Arm)   Pulse 72   Temp 98.1 F (36.7 C) (Oral)   Resp 16   SpO2 96%   Visual Acuity Right Eye Distance:   Left Eye Distance:   Bilateral Distance:    Right Eye Near:   Left Eye Near:    Bilateral Near:  Physical Exam Vitals and nursing note reviewed.  Constitutional:      General: She is not in acute distress.    Appearance: Normal appearance. She is not toxic-appearing.  HENT:     Mouth/Throat:     Mouth: Mucous membranes are moist.     Pharynx: Oropharynx is clear.  Pulmonary:     Effort: Pulmonary effort is normal. No respiratory distress.  Musculoskeletal:     Comments: Inspection: no swelling, bruising, obvious  deformity or redness to left foot Palpation: tender to palpation base of plantar aspect of third phalanx; tender to palpation at the base of the dorsal aspect of the 2nd and 3rd digit; no obvious deformities palpated ROM: Full ROM to left foot and all 5 digits Strength: 5/5 bilateral lower extremities Neurovascular: neurovascularly intact in left lower extremity  Skin:    General: Skin is warm and dry.     Capillary Refill: Capillary refill takes less than 2 seconds.     Coloration: Skin is not jaundiced or pale.     Findings: No erythema.  Neurological:     Mental Status: She is alert and oriented to person, place, and time.  Psychiatric:        Behavior: Behavior is cooperative.      UC Treatments / Results  Labs (all labs ordered are listed, but only abnormal results are displayed) Labs Reviewed - No data to display  EKG   Radiology DG Foot Complete Left Result Date: 09/02/2023 CLINICAL DATA:  Left foot pain. EXAM: LEFT FOOT - COMPLETE 3+ VIEW COMPARISON:  None Available. FINDINGS: There is no evidence of fracture or dislocation. There is no evidence of arthropathy or other focal bone abnormality. Soft tissues are unremarkable. IMPRESSION: Negative. Electronically Signed   By: Donnal Fusi M.D.   On: 09/02/2023 11:55    Procedures Procedures (including critical care time)  Medications Ordered in UC Medications - No data to display  Initial Impression / Assessment and Plan / UC Course  I have reviewed the triage vital signs and the nursing notes.  Pertinent labs & imaging results that were available during my care of the patient were reviewed by me and considered in my medical decision making (see chart for details).   Patient is well-appearing, normotensive, afebrile, not tachycardic, not tachypneic, oxygenating well on room air.    1. Left foot pain X-ray imaging pending at time of discharge; low suspicion for foreign body or acute fracture and no obvious bony  abnormalities visualized by myself on x-ray imaging today Will contact patient if x-ray results result in a change in treatment plan In meantime, will treat for extended gout flare with oral prednisone  40 mg daily for 5 days, then complete prednisone  taper as previously prescribed Return and ER precautions discussed with patient  Update: X-ray imaging is negative for acute bony abnormality.  No foreign body visualized.  No change to treatment plan.  Patient updated via MyChart.  The patient was given the opportunity to ask questions.  All questions answered to their satisfaction.  The patient is in agreement to this plan.   Final Clinical Impressions(s) / UC Diagnoses   Final diagnoses:  Left foot pain     Discharge Instructions      The foot xray today does not appear to show any obvious broken bones.  I will contact you later today if the radiologist reads the x-ray differently.  In the meantime, recommend taking prednisone  as we discussed and close follow-up with podiatry  if symptoms do not improve; contact information has been provided.     ED Prescriptions     Medication Sig Dispense Auth. Provider   predniSONE  (DELTASONE ) 20 MG tablet Take 2 tablets (40 mg total) by mouth daily with breakfast for 4 days, THEN 1 tablet (20 mg total) daily with breakfast for 2 days. 10 tablet Wilhemena Harbour, NP      PDMP not reviewed this encounter.   Wilhemena Harbour, NP 09/02/23 [id number]

## 2023-09-02 NOTE — ED Triage Notes (Signed)
 Left foot pain x 1 week.   Pt states she was walking at a art show a week ago and started having swelling and pain in her left foot. Pt has hx of RA. Pt was advised to go to ER to rule out blood clot due to swelling in leg. Pt was prescribed prednisone  and states it helped with her leg and foot but now her left 3rd toe is still in pain.   Pt thinks there might be something stuck in her toe.   Taking tylenol  and ibuprofen .

## 2023-09-13 ENCOUNTER — Encounter: Payer: Self-pay | Admitting: Cardiovascular Disease

## 2023-09-13 ENCOUNTER — Ambulatory Visit: Attending: Cardiovascular Disease | Admitting: Cardiovascular Disease

## 2023-09-13 VITALS — BP 136/80 | HR 66 | Ht 65.5 in | Wt 269.0 lb

## 2023-09-13 DIAGNOSIS — Z8249 Family history of ischemic heart disease and other diseases of the circulatory system: Secondary | ICD-10-CM

## 2023-09-13 DIAGNOSIS — M3313 Other dermatomyositis without myopathy: Secondary | ICD-10-CM | POA: Diagnosis not present

## 2023-09-13 DIAGNOSIS — I10 Essential (primary) hypertension: Secondary | ICD-10-CM

## 2023-09-13 NOTE — Progress Notes (Signed)
 Cardiology Office Note:    Date:  09/13/2023   ID:  Gail Schmitt, DOB 28-Dec-1960, MRN [id number]  PCP:  Danella Dunn, MD  Cardiologist:  Luana Rumple, MD  Electrophysiologist:  None   Referring MD: Danella Dunn, MD   No chief complaint on file.   History of Present Illness:    Gail Schmitt is a 63 y.o. 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  Recently she has had a facial rash as well as a rash on her back in addition to chronic musculoskeletal complaints.  She has not had any chest pain recently, either pleuritic or musculoskeletal or anginal.  She has not had exertional angina or dyspnea, does not have lower extremity edema, orthopnea or PND.  Denies palpitations, dizziness or syncope.  She is very proud that she has managed to lose over 60 pounds in the last year.  She is doing this by watching her food portions and eating carefully.  When her musculoskeletal symptoms improved she does try to exercise.  Labs over the last year have shown normal CK values (89-178) and mildly elevated CRP (1.21-1.80) and ESR (33-56).  Most recent CBC with all values in normal range.  Recent chemistries with normal liver function tests and normal renal parameters.   She was hospitalized in August 2019 with chest pain.  Cardiac enzymes were normal.  Coronary CT angiography was negative for pulmonary embolism.  The coronary calcium  score was 0.  Echocardiogram showed borderline LVEF of 50% with mild LVH, but without  overt diastolic dysfunction.  Echo did not confirm the reported diagnosis of mitral valve prolapse.   Past Medical History:  Diagnosis Date   Arthritis    "left hip; hands" (07/28/2014)   Breast cancer, left breast (HCC) 1999   a. s/p left mastectomy   Dermatomyositis (HCC)    a. 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HCC)    Family history of adverse reaction to anesthesia    "all my daughters get real sick and throw up alot"   Family history of brain cancer    Family history of breast cancer    Family history of stomach cancer    Fatty liver    Headache    "maybe monthly" (07/28/2014)   Heart murmur    Hypertension    Mitral valve prolapse    Normal coronary arteries    last cath 2016   Obesity    Personal history of chemotherapy    1999 breast cancer   Pneumonia    "haven't had it in the last 4 yrs; before that I had it q yr for 4-5 years" (07/28/2014)   Pulmonary embolism (HCC) ~ 2003   RBBB    with symptomatic bradycardia   Tubular adenoma of colon    Unstable angina (HCC)    a. Cath ~ 10 + yrs ago, [facility], [place] - "normal but pressures were high."   Uterine cancer (HCC) 2007    Past Surgical History:  Procedure Laterality Date   BREAST BIOPSY Left  1999   CARDIAC CATHETERIZATION  07/13/2011   CHOLECYSTECTOMY N/A 07/31/2014   Procedure: LAPAROSCOPIC CHOLECYSTECTOMY WITH INTRAOPERATIVE CHOLANGIOGRAM;  Surgeon: Sim Dryer, MD;  Location: [facility] OR;  Service: General;  Laterality: N/A;   INGUINAL HERNIA REPAIR Left 1986   LAPAROSCOPIC TOTAL HYSTERECTOMY     LEFT HEART CATHETERIZATION WITH CORONARY ANGIOGRAM N/A 07/13/2011   Procedure: LEFT HEART CATHETERIZATION WITH CORONARY ANGIOGRAM;  Surgeon: Loyde Rule, MD;  Location: [facility] CATH LAB;  Service: Cardiovascular;  Laterality: N/A;   LEFT HEART CATHETERIZATION WITH CORONARY ANGIOGRAM N/A 07/30/2014   Procedure: LEFT HEART CATHETERIZATION  WITH CORONARY ANGIOGRAM;  Surgeon: Lucendia Rusk, MD;  Location: [facility] CATH LAB;  Service: Cardiovascular;  Laterality: N/A;   MASTECTOMY Left 1999   RECONSTRUCTION BREAST IMMEDIATE / DELAYED W/ TISSUE EXPANDER Left 2005   REDUCTION MAMMAPLASTY Right    TOTAL ABDOMINAL HYSTERECTOMY  2007   TUBAL LIGATION  1990    Current Medications: Current Meds  Medication Sig   acetaminophen  (TYLENOL ) 500 MG tablet Take 500 mg by mouth every 6 (six) hours as needed.   amLODipine  (NORVASC ) 2.5 MG tablet Take 1 tablet by mouth once daily   cyclobenzaprine  (FLEXERIL ) 10 MG tablet Take 10 mg by mouth every 8 (eight) hours.   folic acid (FOLVITE) 1 MG tablet Take 1 mg by mouth daily.   hydrochlorothiazide  (HYDRODIURIL ) 25 MG tablet Take 1 tablet by mouth once daily   hydrocortisone 2.5 % cream Apply 1 Application topically 2 (two) times daily.   hydrOXYzine (ATARAX) 25 MG tablet Take 1 tablet by mouth at bedtime.   latanoprost (XALATAN) 0.005 % ophthalmic solution INSTILL 1 DROP INTO BOTH EYES EVERY DAY AT BEDTIME   lidocaine  (LIDODERM ) 5 % Place 1 patch onto the skin daily. Remove & Discard patch within 12 hours or as directed by MD   lisinopril  (ZESTRIL ) 40 MG tablet Take 1 tablet (40 mg total) by mouth daily. Please call office to schedule an appt for further refills. Thank you   loperamide (IMODIUM) 2 MG capsule Take 2 mg by mouth as needed for diarrhea or loose stools.   loratadine (CLARITIN) 10 MG tablet Take 10 mg by mouth daily.   methotrexate (RHEUMATREX) 2.5 MG tablet Take 15 mg by mouth once a week.   Multiple Vitamin (MULTIVITAMIN WITH MINERALS) TABS tablet Take 1 tablet by mouth daily.   omeprazole  (PRILOSEC) 20 MG capsule Take 1 capsule (20 mg total) by mouth daily.   Oxcarbazepine (TRILEPTAL) 300 MG tablet Take 300 mg by mouth daily.   triamcinolone cream (KENALOG) 0.1 % APPLY TO AFFECTED AREAS TWO TIMES DAILY     Allergies:   Meloxicam  and Percocet [oxycodone-acetaminophen ]   Social  History   Socioeconomic History   Marital status: Divorced    Spouse name: Not on file   Number of children: 3   Years of education: Not on file   Highest education level: Not on file  Occupational History   Occupation: [employer] dept  Tobacco Use   Smoking status: Never   Smokeless tobacco: Never  Vaping Use   Vaping status: Never Used  Substance and Sexual Activity   Alcohol use: Not Currently    Alcohol/week: 1.0 standard drink of alcohol    Types: 1 Standard drinks or equivalent per week    Comment: 07/28/2014 "might have 1 drink a couple times/month"   Drug use: No   Sexual activity: Not Currently  Other Topics Concern   Not on file  Social  History Narrative   Lives @ home in [place] with 1 of her 3 dtrs.  Works in a [employer] in [employer].  Relatively acitve @ work but doesn't exercise @ home.  Doesn't follow any particular diet.   Social Drivers of Corporate investment banker Strain: Low Risk  (02/16/2023)   Received from [facility]   Overall Financial Resource Strain (CARDIA)    Difficulty of Paying Living Expenses: Not very hard  Food Insecurity: Food Insecurity Present (02/16/2023)   Received from [facility]   Hunger Vital Sign    Worried About Running Out of Food in the Last Year: Sometimes true    Ran Out of Food in the Last Year: Never true  Transportation Needs: No Transportation Needs (02/16/2023)   Received from [facility]    In the past 12 months, has lack of transportation kept you from medical appointments or from getting medications?: No    Lack of Transportation (Non-Medical): No  Physical Activity: Not on file  Stress: Not on file  Social Connections: Unknown (09/19/2020)   [facility]   Social Connections    Frequency of Communication with Friends and Family: Not asked    Frequency of Social Gatherings with Friends and  Family: Not asked     Family History: The patient's family history includes Brain cancer in her paternal aunt and paternal grandmother; Breast cancer in an other family member; Colon polyps in her mother; Diabetes in her sister; Diabetes type II in her mother; Other in her mother; Stroke in her brother. There is no history of Colon cancer.  ROS:   Please see the history of present illness.    All other systems reviewed and are negative.  EKGs/Labs/Other Studies Reviewed:    The following studies were reviewed today: Notes from December 2022 rheumatology visit [facility] Normal carotid duplex ultrasound October 2021 at [facility]  EKG:  EKG is ordered today.  Shows normal sinus rhythm, normal tracing, QTc 463 ms Recent Labs: 08/28/2023: BUN 16; Creatinine, Ser 0.88; Hemoglobin 12.4; Platelets 258; Potassium 3.0; Sodium 138  Recent Lipid Panel    Component Value Date/Time   CHOL 163 12/20/2017 0550   TRIG 56 12/20/2017 0550   HDL 47 12/20/2017 0550   CHOLHDL 3.5 12/20/2017 0550   VLDL 11 12/20/2017 0550   LDLCALC 105 (H) 12/20/2017 0550    Physical Exam:    VS:  BP (!) 144/82 (BP Location: Right Arm, Patient Position: Sitting, Cuff Size: Large)   Pulse 66   Ht 5' 5.5" (1.664 m)   Wt 122 kg   SpO2 99%   BMI 44.08 kg/m     Wt Readings from Last 3 Encounters:  09/13/23 122 kg  08/28/23 121.4 kg  05/09/22 121.4 kg     General: Alert, oriented x3, no distress, morbidly obese Head: no evidence of trauma, PERRL, EOMI, no exophtalmos or lid lag, no myxedema, no xanthelasma; normal ears, nose and oropharynx Neck: normal jugular venous pulsations and no hepatojugular reflux; brisk carotid pulses without delay and no carotid bruits Chest: clear to auscultation, no signs of consolidation by percussion or palpation, normal fremitus, symmetrical and full respiratory excursions Cardiovascular: normal position and quality of the apical impulse, regular rhythm, normal first and second heart  sounds, no murmurs, rubs or gallops Abdomen: no tenderness or distention, no masses by palpation, no abnormal pulsatility or arterial bruits, normal bowel sounds,  no hepatosplenomegaly Extremities: no clubbing, cyanosis or edema; 2+ radial, ulnar and brachial pulses bilaterally; 2+ right femoral, posterior tibial and dorsalis pedis pulses; 2+ left femoral, posterior tibial and dorsalis pedis pulses; no subclavian or femoral bruits Neurological: grossly nonfocal Psych: Normal mood and affect    ASSESSMENT:    1. Essential hypertension      PLAN:    In order of problems listed above:  Chest pain: Currently asymptomatic.  Previous episodes may have represented autoimmune pleuritis or pericarditis, but she is also had symptoms suggestive of costochondritis.  Extensive previous coronary work-up was negative.  HTN: Well-controlled.  Occasionally blood pressure increases during treatment with steroids or NSAIDs. Dermatomyositis: Currently primarily with cutaneous symptoms.  Rheumatologist is Dr. Renelda Carry at [facility].  Her rheumatological disorder appears to be connected to her breast cancer diagnosis improved following surgery, but subsequently recurred.  He has been keeping up-to-date with mammograms.  No evidence of cancer has been identified Morbid obesity: Done an excellent job with weight loss, losing about 60 pounds.  She remains in morbidly obese range but continues to work on healthy diet and hopefully will be able to exercise more as her musculoskeletal symptoms improved.  Medication Adjustments/Labs and Tests Ordered: Current medicines are reviewed at length with the patient today.  Concerns regarding medicines are outlined above.  Orders Placed This Encounter  Procedures   EKG 12-Lead   No orders of the defined types were placed in this encounter.   There are no Patient Instructions on file for this visit.   Signed, Luana Rumple, MD  09/13/2023 9:05 AM    [facility]

## 2023-09-13 NOTE — Patient Instructions (Signed)
 Medication Instructions:  No changes *If you need a refill on your cardiac medications before your next appointment, please call your pharmacy*  Lab Work: Lipid panel If you have labs (blood work) drawn today and your tests are completely normal, you will receive your results only by: MyChart Message (if you have MyChart) OR A paper copy in the mail If you have any lab test that is abnormal or we need to change your treatment, we will call you to review the results.  Follow-Up: At [facility], you and your health needs are our priority.  As part of our continuing mission to provide you with exceptional heart care, our providers are all part of one team.  This team includes your primary Cardiologist (physician) and Advanced Practice Providers or APPs (Physician Assistants and Nurse Practitioners) who all work together to provide you with the care you need, when you need it.  Your next appointment:   1 year(s)  Provider:   Luana Rumple, MD    We recommend signing up for the patient portal called "MyChart".  Sign up information is provided on this After Visit Summary.  MyChart is used to connect with patients for Virtual Visits (Telemedicine).  Patients are able to view lab/test results, encounter notes, upcoming appointments, etc.  Non-urgent messages can be sent to your provider as well.   To learn more about what you can do with MyChart, go to [email or web address].

## 2023-09-13 NOTE — Progress Notes (Signed)
 Cardiology Office Note:    Date:  09/13/2023   ID:  Gail Schmitt, DOB 1960-05-12, MRN [id number]  PCP:  Danella Dunn, MD  Cardiologist:  Luana Rumple, MD  Electrophysiologist:  None   Referring MD: Danella Dunn, MD   Chief Complaint  Patient presents with   Follow-up    History of Present Illness:    Gail Schmitt is a 63 y.o. 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  On chronic methotrexate therapy her autoimmune disease symptoms are generally well-controlled although she has to take brief courses of steroids about 3 times a year.  She just finished a course of prednisone  last Saturday.  Has issues with muscle weakness in her legs and is getting physical therapy.  She uses a walker.  She has not recently had any problems with a rash.  She has not had any symptoms of pericarditis or other types of chest pain.  She denies shortness of breath at rest or with activity, lower extremity edema, palpitations, dizziness, syncope or claudication.  She had carpal tunnel syndrome and symptoms improved with carpal tunnel release.  Also has a history of lumbar stenosis and cervical spine issues.  At this point many of her symptoms seem to be attributable to fibromyalgia rather than her dermatomyositis.  Her recent labs have shown normal CK values, but the inflammatory markers remain slightly elevated with a CRP that is typically in the 1.3 range.  Labs performed in January showed normal liver function tests and a creatinine of 0.8, normal CBC.  She has not had a recent lipid profile.  She is concerned because her 74 year old sister recently had a myocardial infarction and had widespread  coronary disease.   She was hospitalized in August 2019 with chest pain.  Cardiac enzymes were normal.  Coronary CT angiography was negative for pulmonary embolism.  The coronary calcium  score was 0.  Echocardiogram showed borderline LVEF of 50% with mild LVH, but without overt diastolic dysfunction.  Echo did not confirm the reported diagnosis of mitral valve prolapse.   Past Medical History:  Diagnosis Date   Arthritis    "left hip; hands" (07/28/2014)   Breast cancer, left breast (HCC) 1999   a. s/p left mastectomy   Dermatomyositis (HCC)    a. 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HCC)    Family history of adverse reaction to anesthesia    "all my daughters get real sick and throw up alot"   Family history of brain cancer    Family history of breast cancer    Family history of stomach cancer    Fatty liver    Headache    "maybe monthly" (07/28/2014)   Heart murmur    Hypertension    Mitral valve prolapse    Normal coronary arteries    last cath 2016   Obesity    Personal history of chemotherapy    1999 breast cancer   Pneumonia    "haven't had it in the last 4 yrs; before that I had it q yr for 4-5 years" (07/28/2014)   Pulmonary embolism (HCC) ~ 2003   RBBB    with symptomatic bradycardia   Tubular adenoma of  colon    Unstable angina (HCC)    a. Cath ~ 10 + yrs ago, [facility], [place] - "normal but pressures were high."   Uterine cancer (HCC) 2007    Past Surgical History:  Procedure Laterality Date   BREAST BIOPSY Left 1999   CARDIAC CATHETERIZATION  07/13/2011   CHOLECYSTECTOMY N/A 07/31/2014   Procedure: LAPAROSCOPIC CHOLECYSTECTOMY WITH INTRAOPERATIVE CHOLANGIOGRAM;  Surgeon: Sim Dryer, MD;  Location: [facility];  Service: General;  Laterality: N/A;   INGUINAL HERNIA REPAIR Left 1986   LAPAROSCOPIC TOTAL HYSTERECTOMY     LEFT HEART CATHETERIZATION WITH CORONARY ANGIOGRAM N/A  07/13/2011   Procedure: LEFT HEART CATHETERIZATION WITH CORONARY ANGIOGRAM;  Surgeon: Loyde Rule, MD;  Location: [facility] CATH LAB;  Service: Cardiovascular;  Laterality: N/A;   LEFT HEART CATHETERIZATION WITH CORONARY ANGIOGRAM N/A 07/30/2014   Procedure: LEFT HEART CATHETERIZATION WITH CORONARY ANGIOGRAM;  Surgeon: Lucendia Rusk, MD;  Location: [facility] CATH LAB;  Service: Cardiovascular;  Laterality: N/A;   MASTECTOMY Left 1999   RECONSTRUCTION BREAST IMMEDIATE / DELAYED W/ TISSUE EXPANDER Left 2005   REDUCTION MAMMAPLASTY Right    TOTAL ABDOMINAL HYSTERECTOMY  2007   TUBAL LIGATION  1990    Current Medications: Current Meds  Medication Sig   acetaminophen  (TYLENOL ) 500 MG tablet Take 500 mg by mouth every 6 (six) hours as needed.   amLODipine  (NORVASC ) 2.5 MG tablet Take 1 tablet by mouth once daily   cyclobenzaprine  (FLEXERIL ) 10 MG tablet Take 10 mg by mouth every 8 (eight) hours.   folic acid (FOLVITE) 1 MG tablet Take 1 mg by mouth daily.   hydrochlorothiazide  (HYDRODIURIL ) 25 MG tablet Take 1 tablet by mouth once daily   hydrocortisone 2.5 % cream Apply 1 Application topically 2 (two) times daily.   hydrOXYzine (ATARAX) 25 MG tablet Take 1 tablet by mouth at bedtime.   latanoprost (XALATAN) 0.005 % ophthalmic solution INSTILL 1 DROP INTO BOTH EYES EVERY DAY AT BEDTIME   lidocaine  (LIDODERM ) 5 % Place 1 patch onto the skin daily. Remove & Discard patch within 12 hours or as directed by MD   lisinopril  (ZESTRIL ) 40 MG tablet Take 1 tablet (40 mg total) by mouth daily. Please call office to schedule an appt for further refills. Thank you   loperamide (IMODIUM) 2 MG capsule Take 2 mg by mouth as needed for diarrhea or loose stools.   loratadine (CLARITIN) 10 MG tablet Take 10 mg by mouth daily.   methotrexate (RHEUMATREX) 2.5 MG tablet Take 15 mg by mouth once a week.   Multiple Vitamin (MULTIVITAMIN WITH MINERALS) TABS tablet Take 1 tablet by mouth daily.   omeprazole  (PRILOSEC) 20 MG  capsule Take 1 capsule (20 mg total) by mouth daily.   Oxcarbazepine (TRILEPTAL) 300 MG tablet Take 300 mg by mouth daily.   triamcinolone cream (KENALOG) 0.1 % APPLY TO AFFECTED AREAS TWO TIMES DAILY     Allergies:   Meloxicam  and Percocet [oxycodone-acetaminophen ]   Family History: The patient's family history includes Brain cancer in her paternal aunt and paternal grandmother; Breast cancer in an other family member; Colon polyps in her mother; Diabetes in her sister; Diabetes type II in her mother; Other in her mother; Stroke in her brother. There is no history of Colon cancer.   EKGs/Labs/Other Studies Reviewed:    The following studies were reviewed today: Labs and office notes from her rheumatology visit at [facility] January 2025  EKG:    EKG Interpretation Date/Time:  Friday Sep 13 2023 08:45:19 EDT Ventricular Rate:  66 PR Interval:  126 QRS Duration:  84 QT Interval:  408 QTC Calculation: 427 R Axis:   7  Text Interpretation: Normal sinus rhythm Nonspecific ST and T wave abnormality When compared with ECG of 08-Dec-2018 03:14, No significant change was found Confirmed by Gelisa Tieken ([id number]) on 09/13/2023 9:05:41 AM        Recent Labs: 08/28/2023: BUN 16; Creatinine, Ser 0.88; Hemoglobin 12.4; Platelets 258; Potassium 3.0; Sodium 138  Recent Lipid Panel    Component Value Date/Time   CHOL 163 12/20/2017 0550   TRIG 56 12/20/2017 0550   HDL 47 12/20/2017 0550   CHOLHDL 3.5 12/20/2017 0550   VLDL 11 12/20/2017 0550   LDLCALC 105 (H) 12/20/2017 0550    Physical Exam:    VS:  BP 136/80   Pulse 66   Ht 5' 5.5" (1.664 m)   Wt 122 kg   SpO2 99%   BMI 44.08 kg/m     Wt Readings from Last 3 Encounters:  09/13/23 122 kg  08/28/23 121.4 kg  05/09/22 121.4 kg     General: Alert, oriented x3, no distress, morbidly obese Head: no evidence of trauma, PERRL, EOMI, no exophtalmos or lid lag, no myxedema, no xanthelasma; normal ears, nose and oropharynx Neck: normal  jugular venous pulsations and no hepatojugular reflux; brisk carotid pulses without delay and no carotid bruits Chest: clear to auscultation, no signs of consolidation by percussion or palpation, normal fremitus, symmetrical and full respiratory excursions Cardiovascular: normal position and quality of the apical impulse, regular rhythm, normal first and second heart sounds, no murmurs, rubs or gallops Abdomen: no tenderness or distention, no masses by palpation, no abnormal pulsatility or arterial bruits, normal bowel sounds, no hepatosplenomegaly Extremities: no clubbing, cyanosis or edema; 2+ radial, ulnar and brachial pulses bilaterally; 2+ right femoral, posterior tibial and dorsalis pedis pulses; 2+ left femoral, posterior tibial and dorsalis pedis pulses; no subclavian or femoral bruits Neurological: grossly nonfocal Psych: Normal mood and affect     ASSESSMENT:    1. Family history of premature coronary artery disease   2. Essential hypertension   3. Dermatomyositis (HCC)   4. Morbid obesity (HCC)      PLAN:    In order of problems listed above:  Family history of CAD: Premature coronary disease in her 35 year old sister.  Will recheck her lipid profile.  No symptoms of CAD and normal ECG.  Normal coronary CT angiogram and coronary calcium  score of 0 in 2019 when she was 63 years old. HTN: Borderline high, but she is just completed a course of steroids.  Continue the same medication. Dermatomyositis: Well-controlled on methotrexate, occasionally requires a steroid pulse.   Rheumatologist is Dr. Renelda Carry at [facility].  Her rheumatological disorder appears to be connected to her breast cancer diagnosis improved following surgery, but subsequently recurred.  He has been keeping up-to-date with mammograms.  No evidence of cancer has been identified Morbid obesity: Although she has managed to lose about 60 pounds, she remains morbid obese range with a BMI of 44.  Physical exercise is  limited by her musculoskeletal issues.  Medication Adjustments/Labs and Tests Ordered: Current medicines are reviewed at length with the patient today.  Concerns regarding medicines are outlined above.  Orders Placed This Encounter  Procedures   Lipid panel   EKG 12-Lead   No orders of the defined types were placed in this encounter.   Patient Instructions  Medication Instructions:  No changes *If  you need a refill on your cardiac medications before your next appointment, please call your pharmacy*  Lab Work: Lipid panel If you have labs (blood work) drawn today and your tests are completely normal, you will receive your results only by: MyChart Message (if you have MyChart) OR A paper copy in the mail If you have any lab test that is abnormal or we need to change your treatment, we will call you to review the results.  Follow-Up: At [facility], you and your health needs are our priority.  As part of our continuing mission to provide you with exceptional heart care, our providers are all part of one team.  This team includes your primary Cardiologist (physician) and Advanced Practice Providers or APPs (Physician Assistants and Nurse Practitioners) who all work together to provide you with the care you need, when you need it.  Your next appointment:   1 year(s)  Provider:   Luana Rumple, MD    We recommend signing up for the patient portal called "MyChart".  Sign up information is provided on this After Visit Summary.  MyChart is used to connect with patients for Virtual Visits (Telemedicine).  Patients are able to view lab/test results, encounter notes, upcoming appointments, etc.  Non-urgent messages can be sent to your provider as well.   To learn more about what you can do with MyChart, go to [email or web address].          Signed, Luana Rumple, MD  09/13/2023 11:04 AM    [facility]

## 2023-09-14 LAB — LIPID PANEL
Chol/HDL Ratio: 3.4 ratio (ref 0.0–4.4)
Cholesterol, Total: 204 mg/dL — ABNORMAL HIGH (ref 100–199)
HDL: 60 mg/dL (ref >39)
LDL Chol Calc (NIH): 120 mg/dL — ABNORMAL HIGH (ref 0–99)
Triglycerides: 139 mg/dL (ref 0–149)
VLDL Cholesterol Cal: 24 mg/dL (ref 5–40)

## 2023-09-15 ENCOUNTER — Encounter: Payer: Self-pay | Admitting: Cardiovascular Disease

## 2023-09-21 ENCOUNTER — Other Ambulatory Visit: Payer: Self-pay | Admitting: Cardiovascular Disease

## 2023-09-29 ENCOUNTER — Other Ambulatory Visit: Payer: Self-pay | Admitting: Cardiovascular Disease

## 2023-11-17 ENCOUNTER — Other Ambulatory Visit: Payer: Self-pay | Admitting: Cardiovascular Disease

## 2023-12-07 ENCOUNTER — Ambulatory Visit: Admission: EM | Admit: 2023-12-07 | Discharge: 2023-12-07 | Disposition: A | Discharge status: Home / Self Care

## 2023-12-07 ENCOUNTER — Encounter: Payer: Self-pay | Admitting: Emergency Medicine

## 2023-12-07 DIAGNOSIS — R21 Rash and other nonspecific skin eruption: Secondary | ICD-10-CM

## 2023-12-07 NOTE — ED Triage Notes (Signed)
 Thinks she was bit by something on back of right lower leg today  States she was pulling weeds and felt something bite her.  States got a rake and was digging around the area and saw a gray snake.  Patient has a bruised area on back of right leg.

## 2023-12-07 NOTE — Discharge Instructions (Signed)
Patient declined AVS 

## 2023-12-07 NOTE — ED Provider Notes (Signed)
 [facility]    CSN: [id number] Arrival date & time: 12/07/23  1224      History   Chief Complaint Chief Complaint  Patient presents with   bruised area on back of right leg    HPI Gail Schmitt is a 63 y.o. female.   The history is provided by the patient.   Patient presents for complaints of a snake bite or insect bite to the back of her right lower leg.  Patient states she was out pulling weeds when something bit the back of her leg.  She states that she grabbed the back of her leg, but did not see anything.  She states that she subsequently looked down and saw a small grayish-beige looking snake.  Patient states that she did clean the area with peroxide.  She denies fever, chills, redness, or swelling at the site. Past Medical History:  Diagnosis Date   Arthritis    left hip; hands (07/28/2014)   Breast cancer, left breast (HCC) 1999   a. s/p left mastectomy   Dermatomyositis (HCC)    a. noted prior to diagnosis of Breast CA (i'm allergic to cancer)   Dermatomyositis associated with neoplastic disease (HCC)    Family history of adverse reaction to anesthesia    all my daughters get real sick and throw up alot   Family history of brain cancer    Family history of breast cancer    Family history of stomach cancer    Fatty liver    Headache    maybe monthly (07/28/2014)   Heart murmur    Hypertension    Mitral valve prolapse    Normal coronary arteries    last cath 2016   Obesity    Personal history of chemotherapy    1999 breast cancer   Pneumonia    haven't had it in the last 4 yrs; before that I had it q yr for 4-5 years (07/28/2014)   Pulmonary embolism (HCC) ~ 2003   RBBB    with symptomatic bradycardia   Tubular adenoma of colon    Unstable angina (HCC)    a. Cath ~ 10 + yrs ago, [facility], [place] - normal but pressures were high.   Uterine cancer [facility]) 2007    Patient Active Problem List   Diagnosis Date Noted   Malignant  neoplasm of overlapping sites of left breast in female, estrogen receptor negative (HCC) 07/30/2019   Genetic testing 02/20/2019   History of uterine cancer 02/05/2019   Family history of brain cancer    Family history of stomach cancer    Family history of breast cancer    False positive stress test 04/19/2018   Beta-blockers contraindicated due to bradycardia 01/09/2018   Normal coronary arteries 01/09/2018   History of breast cancer 01/09/2018   History of pulmonary embolus (PE) 01/09/2018   Dermatomyositis (HCC)    Symptomatic cholelithiasis 07/06/2014   Vitamin D  deficiency 06/23/2014   Fatty infiltration of liver 06/23/2014   Obesity    Chest pain 07/12/2011   Essential hypertension 07/12/2011    Past Surgical History:  Procedure Laterality Date   BREAST BIOPSY Left 1999   CARDIAC CATHETERIZATION  07/13/2011   CHOLECYSTECTOMY N/A 07/31/2014   Procedure: LAPAROSCOPIC CHOLECYSTECTOMY WITH INTRAOPERATIVE CHOLANGIOGRAM;  Surgeon: Debby Shipper, MD;  Location: [facility] OR;  Service: General;  Laterality: N/A;   INGUINAL HERNIA REPAIR Left 1986   LAPAROSCOPIC TOTAL HYSTERECTOMY     LEFT HEART CATHETERIZATION WITH CORONARY ANGIOGRAM  N/A 07/13/2011   Procedure: LEFT HEART CATHETERIZATION WITH CORONARY ANGIOGRAM;  Surgeon: Maude JAYSON Emmer, MD;  Location: [facility] CATH LAB;  Service: Cardiovascular;  Laterality: N/A;   LEFT HEART CATHETERIZATION WITH CORONARY ANGIOGRAM N/A 07/30/2014   Procedure: LEFT HEART CATHETERIZATION WITH CORONARY ANGIOGRAM;  Surgeon: Candyce GORMAN Reek, MD;  Location: [facility] CATH LAB;  Service: Cardiovascular;  Laterality: N/A;   MASTECTOMY Left 1999   RECONSTRUCTION BREAST IMMEDIATE / DELAYED W/ TISSUE EXPANDER Left 2005   REDUCTION MAMMAPLASTY Right    TOTAL ABDOMINAL HYSTERECTOMY  2007   TUBAL LIGATION  1990    OB History   No obstetric history on file.      Home Medications    Prior to Admission medications   Medication Sig Start Date End Date Taking?  Authorizing Provider  acetaminophen  (TYLENOL ) 500 MG tablet Take 500 mg by mouth every 6 (six) hours as needed. 01/18/12   [provider]  amLODipine  (NORVASC ) 2.5 MG tablet Take 1 tablet (2.5 mg total) by mouth daily. 09/23/23   Croitoru, Mihai, MD  budesonide-formoterol (SYMBICORT) 160-4.5 MCG/ACT inhaler Inhale 2 puffs into the lungs 2 (two) times daily as needed. Patient not taking: Reported on 05/09/2022    [provider]  calcium  carbonate (TUMS SMOOTHIES) 750 MG chewable tablet Chew 2 tablets by mouth as needed for heartburn. Patient not taking: Reported on 09/13/2023 01/18/12   [provider]  cyclobenzaprine  (FLEXERIL ) 10 MG tablet Take 10 mg by mouth every 8 (eight) hours. 06/04/18   [provider]  folic acid (FOLVITE) 1 MG tablet Take 1 mg by mouth daily.    [provider]  hydrochlorothiazide  (HYDRODIURIL ) 25 MG tablet Take 1 tablet by mouth once daily 11/18/23   Croitoru, Mihai, MD  hydrocortisone 2.5 % cream Apply 1 Application topically 2 (two) times daily. 03/11/23 03/10/24  [provider]  hydrOXYzine (ATARAX) 25 MG tablet Take 1 tablet by mouth at bedtime. 07/31/18   [provider]  latanoprost (XALATAN) 0.005 % ophthalmic solution INSTILL 1 DROP INTO BOTH EYES EVERY DAY AT BEDTIME 01/05/19   [provider]  lidocaine  (LIDODERM ) 5 % Place 1 patch onto the skin daily. Remove & Discard patch within 12 hours or as directed by MD 12/08/18   Nettie, April, MD  lisinopril  (ZESTRIL ) 40 MG tablet TAKE 1 TABLET BY MOUTH ONCE DAILY . APPOINTMENT REQUIRED FOR FUTURE REFILLS 10/01/23   Croitoru, Jerel, MD  loperamide (IMODIUM) 2 MG capsule Take 2 mg by mouth as needed for diarrhea or loose stools.    [provider]  loratadine (CLARITIN) 10 MG tablet Take 10 mg by mouth daily.    [provider]  LORazepam  (ATIVAN ) 1 MG tablet Take 1 hour prior to MRI and may repeat if needed. Patient not taking: Reported on  09/13/2023 06/22/19   Magrinat, Gustav C, MD  methocarbamol (ROBAXIN) 500 MG tablet Take 500 mg by mouth 3 (three) times daily as needed. Patient not taking: Reported on 09/13/2023    [provider]  methotrexate (RHEUMATREX) 2.5 MG tablet Take 15 mg by mouth once a week. 05/20/23 05/19/24  [provider]  Multiple Vitamin (MULTIVITAMIN WITH MINERALS) TABS tablet Take 1 tablet by mouth daily.    [provider]  nitroGLYCERIN  (NITROSTAT ) 0.4 MG SL tablet Place 1 tablet (0.4 mg total) under the tongue every 5 (five) minutes as needed for chest pain. Patient not taking: Reported on 05/09/2022 12/21/17 05/17/21  Sebastian Lamarr SAUNDERS, PA-C  omeprazole  [facility])  20 MG capsule Take 1 capsule (20 mg total) by mouth daily. 12/08/18   Palumbo, April, MD  Oxcarbazepine (TRILEPTAL) 300 MG tablet Take 300 mg by mouth daily.    [provider]  triamcinolone cream (KENALOG) 0.1 % APPLY TO AFFECTED AREAS TWO TIMES DAILY 01/28/19   [provider]    Family History Family History  Problem Relation Age of Onset   Diabetes type II Mother    Colon polyps Mother    Other Mother        cholangiocarcinoma   Diabetes Sister    Stroke Brother    Brain cancer Paternal Aunt    Brain cancer Paternal Grandmother    Breast cancer Other    Colon cancer Neg Hx     Social History Social History   Tobacco Use   Smoking status: Never   Smokeless tobacco: Never  Vaping Use   Vaping status: Never Used  Substance Use Topics   Alcohol use: Not Currently    Alcohol/week: 1.0 standard drink of alcohol    Types: 1 Standard drinks or equivalent per week    Comment: 07/28/2014 might have 1 drink a couple times/month   Drug use: No     Allergies   Meloxicam  and Percocet [oxycodone-acetaminophen ]   Review of Systems Review of Systems Per HPI  Physical Exam Triage Vital Signs ED Triage Vitals  Encounter Vitals Group     BP 12/07/23 1242 115/78     Girls Systolic BP  Percentile --      Girls Diastolic BP Percentile --      Boys Systolic BP Percentile --      Boys Diastolic BP Percentile --      Pulse Rate 12/07/23 1242 95     Resp 12/07/23 1242 18     Temp 12/07/23 1242 98.4 F (36.9 C)     Temp Source 12/07/23 1242 Oral     SpO2 12/07/23 1242 96 %     Weight --      Height --      Head Circumference --      Peak Flow --      Pain Score 12/07/23 1245 0     Pain Loc --      Pain Education --      Exclude from Growth Chart --    No data found.  Updated Vital Signs BP 115/78 (BP Location: Right Arm)   Pulse 95   Temp 98.4 F (36.9 C) (Oral)   Resp 18   SpO2 96%   Visual Acuity Right Eye Distance:   Left Eye Distance:   Bilateral Distance:    Right Eye Near:   Left Eye Near:    Bilateral Near:     Physical Exam Vitals and nursing note reviewed.  Constitutional:      General: She is not in acute distress.    Appearance: Normal appearance.  HENT:     Head: Normocephalic.  Eyes:     Extraocular Movements: Extraocular movements intact.     Pupils: Pupils are equal, round, and reactive to light.  Cardiovascular:     Rate and Rhythm: Normal rate and regular rhythm.     Pulses: Normal pulses.     Heart sounds: Normal heart sounds.  Pulmonary:     Effort: Pulmonary effort is normal.     Breath sounds: Normal breath sounds.  Skin:     Neurological:     General: No focal deficit present.  Mental Status: She is alert and oriented to person, place, and time.  Psychiatric:        Mood and Affect: Mood normal.        Behavior: Behavior normal.      UC Treatments / Results  Labs (all labs ordered are listed, but only abnormal results are displayed) Labs Reviewed - No data to display  EKG   Radiology No results found.  Procedures Procedures (including critical care time)  Medications Ordered in UC Medications - No data to display  Initial Impression / Assessment and Plan / UC Course  I have reviewed the triage  vital signs and the nursing notes.  Pertinent labs & imaging results that were available during my care of the patient were reviewed by me and considered in my medical decision making (see chart for details).  Patient presents for concerns for possible snakebite.  On exam, she does have a hyperpigmented area to the posterior right lower leg.  Symptoms not consistent with a snakebite.  Supportive care recommendations were discussed with the patient to include cleansing the area with soap and water, applying a topical antibiotic such as Neosporin, and to monitor for worsening.  Discussed indications with patient regarding follow-up.  Patient was in agreement with this plan of care and verbalizes understanding.  All questions were answered.  Patient stable for discharge.  Final Clinical Impressions(s) / UC Diagnoses   Final diagnoses:  Localized skin eruption     Discharge Instructions      Patient declined AVS.     ED Prescriptions   None    PDMP not reviewed this encounter.   Gilmer Etta PARAS, NP 12/07/23 1318

## 2024-02-13 ENCOUNTER — Other Ambulatory Visit: Payer: Self-pay | Admitting: Cardiovascular Disease

## 2024-06-03 ENCOUNTER — Ambulatory Visit: Admitting: Physical Therapy

## 2024-06-10 ENCOUNTER — Ambulatory Visit: Admitting: Physical Therapy

## 2024-06-11 ENCOUNTER — Ambulatory Visit: Admitting: Physical Therapy

## 2024-06-22 ENCOUNTER — Ambulatory Visit: Admitting: Physical Therapy

## 2024-06-29 ENCOUNTER — Ambulatory Visit: Admitting: Physical Therapy

## 2024-07-06 ENCOUNTER — Ambulatory Visit: Admitting: Physical Therapy

## 2024-07-13 ENCOUNTER — Ambulatory Visit: Admitting: Physical Therapy

## 2024-07-20 ENCOUNTER — Ambulatory Visit: Admitting: Physical Therapy

## 2024-07-27 ENCOUNTER — Ambulatory Visit: Admitting: Physical Therapy

## 2024-08-03 ENCOUNTER — Ambulatory Visit: Admitting: Physical Therapy

## 2024-08-10 ENCOUNTER — Ambulatory Visit: Admitting: Physical Therapy

## 2024-08-17 ENCOUNTER — Ambulatory Visit: Admitting: Physical Therapy

## 2024-08-24 ENCOUNTER — Ambulatory Visit: Admitting: Physical Therapy

## 2024-08-31 ENCOUNTER — Ambulatory Visit: Admitting: Physical Therapy

## 2024-09-09 ENCOUNTER — Ambulatory Visit: Admitting: Cardiovascular Disease
